# Patient Record
Sex: Female | Born: 1945 | Race: White | Hispanic: No | Marital: Married | Smoking: Never smoker
Health system: Southern US, Community
[De-identification: ages and names within clinical notes are randomized; demographics above are authoritative.]

## PROBLEM LIST (undated history)

## (undated) DIAGNOSIS — I10 Essential (primary) hypertension: Secondary | ICD-10-CM

## (undated) DIAGNOSIS — Z9981 Dependence on supplemental oxygen: Secondary | ICD-10-CM

## (undated) DIAGNOSIS — K219 Gastro-esophageal reflux disease without esophagitis: Secondary | ICD-10-CM

## (undated) DIAGNOSIS — F419 Anxiety disorder, unspecified: Secondary | ICD-10-CM

## (undated) HISTORY — DX: Gastro-esophageal reflux disease without esophagitis: K21.9

## (undated) HISTORY — DX: Essential (primary) hypertension: I10

## (undated) HISTORY — PX: DILATION AND CURETTAGE OF UTERUS: SHX78

---

## 1994-08-05 HISTORY — PX: CERVICAL BIOPSY: SHX590

## 1997-12-26 ENCOUNTER — Ambulatory Visit (HOSPITAL_COMMUNITY): Admission: RE | Admit: 1997-12-26 | Discharge: 1997-12-26 | Payer: Self-pay | Admitting: Gynecology

## 1998-08-24 ENCOUNTER — Other Ambulatory Visit: Admission: RE | Admit: 1998-08-24 | Discharge: 1998-08-24 | Payer: Self-pay | Admitting: Gynecology

## 1999-08-27 ENCOUNTER — Other Ambulatory Visit: Admission: RE | Admit: 1999-08-27 | Discharge: 1999-08-27 | Payer: Self-pay | Admitting: Gynecology

## 1999-12-18 ENCOUNTER — Other Ambulatory Visit: Admission: RE | Admit: 1999-12-18 | Discharge: 1999-12-18 | Payer: Self-pay | Admitting: Gynecology

## 2000-01-29 ENCOUNTER — Encounter: Payer: Self-pay | Admitting: Gynecology

## 2000-01-29 ENCOUNTER — Ambulatory Visit (HOSPITAL_COMMUNITY): Admission: RE | Admit: 2000-01-29 | Discharge: 2000-01-29 | Payer: Self-pay | Admitting: Gynecology

## 2000-09-23 ENCOUNTER — Other Ambulatory Visit: Admission: RE | Admit: 2000-09-23 | Discharge: 2000-09-23 | Payer: Self-pay | Admitting: Gynecology

## 2001-04-29 ENCOUNTER — Ambulatory Visit (HOSPITAL_COMMUNITY): Admission: RE | Admit: 2001-04-29 | Discharge: 2001-04-29 | Payer: Self-pay | Admitting: *Deleted

## 2001-12-24 ENCOUNTER — Other Ambulatory Visit: Admission: RE | Admit: 2001-12-24 | Discharge: 2001-12-24 | Payer: Self-pay | Admitting: Gynecology

## 2001-12-25 ENCOUNTER — Encounter: Admission: RE | Admit: 2001-12-25 | Discharge: 2001-12-25 | Payer: Self-pay | Admitting: Internal Medicine

## 2002-07-16 ENCOUNTER — Encounter: Admission: RE | Admit: 2002-07-16 | Discharge: 2002-07-16 | Payer: Self-pay | Admitting: Internal Medicine

## 2002-08-01 ENCOUNTER — Inpatient Hospital Stay (HOSPITAL_COMMUNITY): Admission: EM | Admit: 2002-08-01 | Discharge: 2002-08-04 | Payer: Self-pay | Admitting: Emergency Medicine

## 2002-08-02 ENCOUNTER — Encounter: Payer: Self-pay | Admitting: Critical Care Medicine

## 2002-08-13 ENCOUNTER — Ambulatory Visit (HOSPITAL_COMMUNITY): Admission: RE | Admit: 2002-08-13 | Discharge: 2002-08-13 | Payer: Self-pay | Admitting: Internal Medicine

## 2002-08-13 ENCOUNTER — Encounter (INDEPENDENT_AMBULATORY_CARE_PROVIDER_SITE_OTHER): Payer: Self-pay | Admitting: Specialist

## 2002-10-26 ENCOUNTER — Other Ambulatory Visit: Admission: RE | Admit: 2002-10-26 | Discharge: 2002-10-26 | Payer: Self-pay | Admitting: Gynecology

## 2003-04-27 ENCOUNTER — Ambulatory Visit (HOSPITAL_COMMUNITY): Admission: RE | Admit: 2003-04-27 | Discharge: 2003-04-27 | Payer: Self-pay | Admitting: *Deleted

## 2003-12-05 ENCOUNTER — Other Ambulatory Visit: Admission: RE | Admit: 2003-12-05 | Discharge: 2003-12-05 | Payer: Self-pay | Admitting: Gynecology

## 2004-07-03 ENCOUNTER — Ambulatory Visit: Payer: Self-pay | Admitting: Internal Medicine

## 2004-08-14 ENCOUNTER — Ambulatory Visit: Payer: Self-pay | Admitting: Internal Medicine

## 2004-10-26 ENCOUNTER — Ambulatory Visit: Payer: Self-pay | Admitting: Internal Medicine

## 2004-12-11 ENCOUNTER — Other Ambulatory Visit: Admission: RE | Admit: 2004-12-11 | Discharge: 2004-12-11 | Payer: Self-pay | Admitting: Obstetrics and Gynecology

## 2004-12-18 ENCOUNTER — Ambulatory Visit (HOSPITAL_COMMUNITY): Admission: RE | Admit: 2004-12-18 | Discharge: 2004-12-18 | Payer: Self-pay | Admitting: Obstetrics and Gynecology

## 2005-01-14 ENCOUNTER — Ambulatory Visit: Payer: Self-pay | Admitting: Internal Medicine

## 2005-03-01 ENCOUNTER — Ambulatory Visit: Payer: Self-pay | Admitting: Critical Care Medicine

## 2005-03-12 ENCOUNTER — Ambulatory Visit: Payer: Self-pay | Admitting: Internal Medicine

## 2005-03-26 ENCOUNTER — Ambulatory Visit: Payer: Self-pay | Admitting: Internal Medicine

## 2005-04-09 ENCOUNTER — Ambulatory Visit: Payer: Self-pay | Admitting: Internal Medicine

## 2005-08-08 ENCOUNTER — Ambulatory Visit: Payer: Self-pay | Admitting: Internal Medicine

## 2005-11-21 ENCOUNTER — Ambulatory Visit: Payer: Self-pay | Admitting: Internal Medicine

## 2006-02-24 ENCOUNTER — Ambulatory Visit: Payer: Self-pay | Admitting: Internal Medicine

## 2006-02-28 ENCOUNTER — Ambulatory Visit: Admission: RE | Admit: 2006-02-28 | Discharge: 2006-02-28 | Payer: Self-pay | Admitting: Internal Medicine

## 2006-04-10 ENCOUNTER — Ambulatory Visit: Payer: Self-pay | Admitting: Internal Medicine

## 2006-05-22 ENCOUNTER — Ambulatory Visit: Payer: Self-pay | Admitting: Internal Medicine

## 2006-08-14 ENCOUNTER — Ambulatory Visit: Payer: Self-pay | Admitting: Internal Medicine

## 2006-11-05 ENCOUNTER — Ambulatory Visit: Payer: Self-pay | Admitting: Internal Medicine

## 2007-05-04 ENCOUNTER — Ambulatory Visit: Payer: Self-pay | Admitting: Internal Medicine

## 2007-06-09 ENCOUNTER — Ambulatory Visit: Payer: Self-pay | Admitting: Internal Medicine

## 2007-07-23 DIAGNOSIS — J479 Bronchiectasis, uncomplicated: Secondary | ICD-10-CM

## 2007-07-23 DIAGNOSIS — R05 Cough: Secondary | ICD-10-CM

## 2007-07-23 DIAGNOSIS — J45909 Unspecified asthma, uncomplicated: Secondary | ICD-10-CM | POA: Insufficient documentation

## 2007-07-23 DIAGNOSIS — R059 Cough, unspecified: Secondary | ICD-10-CM | POA: Insufficient documentation

## 2007-08-18 ENCOUNTER — Ambulatory Visit: Payer: Self-pay | Admitting: Internal Medicine

## 2007-08-28 ENCOUNTER — Ambulatory Visit: Payer: Self-pay | Admitting: Internal Medicine

## 2007-11-18 ENCOUNTER — Ambulatory Visit: Payer: Self-pay | Admitting: Internal Medicine

## 2008-02-08 ENCOUNTER — Ambulatory Visit: Payer: Self-pay | Admitting: Internal Medicine

## 2008-02-08 DIAGNOSIS — J310 Chronic rhinitis: Secondary | ICD-10-CM | POA: Insufficient documentation

## 2008-04-20 ENCOUNTER — Encounter: Payer: Self-pay | Admitting: Internal Medicine

## 2008-05-13 ENCOUNTER — Encounter: Payer: Self-pay | Admitting: Internal Medicine

## 2008-05-30 ENCOUNTER — Ambulatory Visit: Payer: Self-pay | Admitting: Internal Medicine

## 2008-06-17 ENCOUNTER — Telehealth (INDEPENDENT_AMBULATORY_CARE_PROVIDER_SITE_OTHER): Payer: Self-pay | Admitting: *Deleted

## 2008-10-04 ENCOUNTER — Ambulatory Visit: Payer: Self-pay | Admitting: Internal Medicine

## 2009-01-03 ENCOUNTER — Ambulatory Visit: Payer: Self-pay | Admitting: Internal Medicine

## 2009-05-05 ENCOUNTER — Ambulatory Visit: Payer: Self-pay | Admitting: Internal Medicine

## 2009-05-08 DIAGNOSIS — K219 Gastro-esophageal reflux disease without esophagitis: Secondary | ICD-10-CM

## 2009-07-03 ENCOUNTER — Ambulatory Visit: Payer: Self-pay | Admitting: Internal Medicine

## 2009-08-24 ENCOUNTER — Telehealth: Payer: Self-pay | Admitting: Internal Medicine

## 2009-09-05 ENCOUNTER — Ambulatory Visit: Payer: Self-pay | Admitting: Internal Medicine

## 2009-09-05 LAB — CONVERTED CEMR LAB
CO2: 33 meq/L — ABNORMAL HIGH (ref 19–32)
Calcium: 9.8 mg/dL (ref 8.4–10.5)
Chloride: 105 meq/L (ref 96–112)
Creatinine, Ser: 0.8 mg/dL (ref 0.4–1.2)

## 2009-09-08 ENCOUNTER — Ambulatory Visit: Payer: Self-pay | Admitting: Cardiovascular Disease

## 2009-10-17 ENCOUNTER — Ambulatory Visit: Payer: Self-pay | Admitting: Internal Medicine

## 2010-01-30 ENCOUNTER — Ambulatory Visit: Payer: Self-pay | Admitting: Internal Medicine

## 2010-01-31 ENCOUNTER — Encounter: Admission: RE | Admit: 2010-01-31 | Discharge: 2010-01-31 | Payer: Self-pay | Admitting: Gastroenterology

## 2010-03-28 ENCOUNTER — Telehealth (INDEPENDENT_AMBULATORY_CARE_PROVIDER_SITE_OTHER): Payer: Self-pay | Admitting: *Deleted

## 2010-06-18 ENCOUNTER — Telehealth: Payer: Self-pay | Admitting: Internal Medicine

## 2010-07-17 ENCOUNTER — Telehealth (INDEPENDENT_AMBULATORY_CARE_PROVIDER_SITE_OTHER): Payer: Self-pay | Admitting: *Deleted

## 2010-07-18 ENCOUNTER — Telehealth (INDEPENDENT_AMBULATORY_CARE_PROVIDER_SITE_OTHER): Payer: Self-pay | Admitting: *Deleted

## 2010-08-27 ENCOUNTER — Encounter: Payer: Self-pay | Admitting: Internal Medicine

## 2010-08-27 ENCOUNTER — Ambulatory Visit
Admission: RE | Admit: 2010-08-27 | Discharge: 2010-08-27 | Payer: Self-pay | Source: Home / Self Care | Attending: Internal Medicine | Admitting: Internal Medicine

## 2010-08-27 ENCOUNTER — Other Ambulatory Visit: Payer: Self-pay | Admitting: Internal Medicine

## 2010-08-27 DIAGNOSIS — R079 Chest pain, unspecified: Secondary | ICD-10-CM | POA: Insufficient documentation

## 2010-08-27 LAB — BASIC METABOLIC PANEL
BUN: 15 mg/dL (ref 6–23)
CO2: 31 mEq/L (ref 19–32)
Calcium: 9.7 mg/dL (ref 8.4–10.5)
Chloride: 101 mEq/L (ref 96–112)
Creatinine, Ser: 0.7 mg/dL (ref 0.4–1.2)
Glucose, Bld: 91 mg/dL (ref 70–99)
Potassium: 3.7 mEq/L (ref 3.5–5.1)

## 2010-08-27 LAB — CBC WITH DIFFERENTIAL/PLATELET
Hemoglobin: 15.7 g/dL — ABNORMAL HIGH (ref 12.0–15.0)
Lymphocytes Relative: 14.9 % (ref 12.0–46.0)
MCHC: 34.5 g/dL (ref 30.0–36.0)
MCV: 88.2 fl (ref 78.0–100.0)
Monocytes Relative: 7.8 % (ref 3.0–12.0)
Neutro Abs: 7.4 10*3/uL (ref 1.4–7.7)
Neutrophils Relative %: 75.3 % (ref 43.0–77.0)
WBC: 9.9 10*3/uL (ref 4.5–10.5)

## 2010-09-01 ENCOUNTER — Emergency Department (HOSPITAL_COMMUNITY)
Admission: EM | Admit: 2010-09-01 | Discharge: 2010-09-02 | Payer: Self-pay | Source: Home / Self Care | Admitting: Emergency Medicine

## 2010-09-01 LAB — POCT CARDIAC MARKERS: CKMB, poc: <1 ng/mL — ABNORMAL LOW (ref 1.0–8.0)

## 2010-09-02 LAB — COMPREHENSIVE METABOLIC PANEL
Albumin: 3.9 g/dL (ref 3.5–5.2)
Alkaline Phosphatase: 83 U/L (ref 39–117)
BUN: 13 mg/dL (ref 6–23)
Calcium: 9.5 mg/dL (ref 8.4–10.5)
Chloride: 104 mEq/L (ref 96–112)
GFR calc non Af Amer: >60 mL/min (ref >60)
Potassium: 3.4 mEq/L — ABNORMAL LOW (ref 3.5–5.1)
Total Bilirubin: 0.7 mg/dL (ref 0.3–1.2)

## 2010-09-02 LAB — LIPASE, BLOOD: Lipase: 24 U/L (ref 11–59)

## 2010-09-02 LAB — CBC
HCT: 43.7 % (ref 36.0–46.0)
MCH: 29.6 pg (ref 26.0–34.0)
MCV: 86.2 fL (ref 78.0–100.0)
RBC: 5.07 MIL/uL (ref 3.87–5.11)
WBC: 10.4 10*3/uL (ref 4.0–10.5)

## 2010-09-02 LAB — DIFFERENTIAL
Basophils Relative: 0 % (ref 0–1)
Eosinophils Absolute: 0.2 10*3/uL (ref 0.0–0.7)
Eosinophils Relative: 2 % (ref 0–5)
Neutro Abs: 6.7 10*3/uL (ref 1.7–7.7)

## 2010-09-04 NOTE — Progress Notes (Signed)
Summary: samples symbicort  Phone Note Call from Patient Call back at Home Phone [phone]   Caller: Patient Call For: wert Summary of Call: pt requests a sample of symbicort- she is in donut hole. ok to leave detailed msg on phone as she is taking her spouse to a meeting now.  Initial call taken by: Tivis Ringer, CNA,  June 18, 2010 9:15 AM  Follow-up for Phone Call        pt advised samples at front.Carron Curie CMA  June 18, 2010 10:29 AM

## 2010-09-04 NOTE — Assessment & Plan Note (Signed)
Summary: NP follow up - med calendar   Primary Provider/Referring Provider:  [facility]  CC:  est med calendar - pt brought all meds with her today.  no complaints.  History of Present Illness: 65  yowf  never smoker with documented bronchiectasis with chronic cough with all of her symptoms completely resolved on Symbicort 80/4.5 two puffs b.i.d. despite evidence of significant airflow obstruction by PFTs.(prev failed qvar).  October 04, 2008 ov no change in doe , no change in cough.   January 03, 2009 --Presents for follow up and med calendar. Since last visit has been doing fairly well, has daily congestion clear to light yellow. Most days minimal production. NO flares w/ antibiotics since last visit. Brought all her meds today,  several changes- she is off several of her as needed meds.  Does have stuffy nose w/ clear mucous over last 2 weeks, mild sinus pressure.   May 05, 2009--Returns of 4 month follow up - c/o head congestion, PND onset this morning, and increase in reflux x2 months - Drainage in throat causing soreness, cough sl increased clear mucus mainly. No otc used.  rec add pepcid at bedtime and zyrtec as needed   July 03, 2009 ov:Pt c/o productive cough with green phlegm x 3 days.  Previously doing well but not understanding yet how to use calendar appropriately for maint vs prns.   mucus tuned almost clear after 5 days of levaquin.  September 05, 2009 6 wk followup.  Pt states that her cough has improved since last seen.  She c/o "feeling breathless" x 2 months, mucus did not clear on zpak,  no really using her prns appropriately.   October 17, 2009--Presents for follow up and med review. Last visit w/ bronchitic flare, tx w/ steroid taper  and Levaquin. Cough and congestion resolved. CT sinus w/ mild chronic dz, CT chest w/ mild diffuse bronchiectasis. She is doing well. Retired in [place] 2011. Walking daily. We reviewed her meds and updated her med calendar.  Denies chest pain, dyspnea, orthopnea, hemoptysis, fever, n/v/d, edema, headache,recent travel .       Marland Kitchen    Medications Prior to Update: 1)  Micardis Hct 80-12.5 Mg  Tabs (Telmisartan-Hctz) .... Take 1 Tablet By Mouth Once A Day 2)  Fluticasone Propionate 50 Mcg/act Susp (Fluticasone Propionate) .... 0-2 Puffs in The Morning, and 1-2 Puffs At Beditme 3)  Symbicort 160-4.5 Mcg/act Aero (Budesonide-Formoterol Fumarate) .... Inhale 2 Puffs Two Times A Day 4)  Multivitamins   Tabs (Multiple Vitamin) .... Once Daily 5)  Adult Aspirin Low Strength 81 Mg  Tbdp (Aspirin) .... Take 1 Tablet By Mouth Once A Day 6)  Caltrate 600 1500 Mg  Tabs (Calcium Carbonate) .... Take 1 Tablet By Mouth Once A Day 7)  Pepcid 20 Mg Tabs (Famotidine) .Marland Kitchen.. 1 At Bedtime 8)  Mucinex Dm 30-600 Mg  Tb12 (Dextromethorphan-Guaifenesin) .Marland Kitchen.. 1-2 Every 12 Hours As Needed For Thick Mucus, Congestion and Cough 9)  Flutter Valve .... Use Several Times A Day As Needed For Thick Mucus, Congestion, and Cough 10)  Xopenex Hfa 45 Mcg/act  Aero (Levalbuterol Tartrate) .... 2 Puffs Every 4 Hrs As Needed For Wheezing/shortness of Breath 11)  Afrin Nasal Spray 0.05 % Soln (Oxymetazoline Hcl) .... 2 Puffs Twice Daily For 5 Days As Needed For Nasal Congestion 12)  Nasal Moisturizer 0.65 % Soln (Saline) .... Use Several Times A Day As Needed For Nasal Congestion 13)  Zyrtec  Allergy 10 Mg Tabs (Cetirizine Hcl) .... Take 1 Tab By Mouth At Bedtime As Needed 14)  Vicodin 5-500 Mg Tabs (Hydrocodone-Acetaminophen) .... One Every 4 Hours As Needed 15)  Benzonatate 100 Mg Caps (Benzonatate) .Marland Kitchen.. 1-2 Tablets Three Times A Day As Needed Cough 16)  Levaquin 750 Mg  Tabs (Levofloxacin) .... One Tablet By Mouth Daily X 5 Days 17)  Prednisone 10 Mg  Tabs (Prednisone) .... 4 Each Am X 2days, 2x2days, 1x2days and Stop  Allergies (verified): 1)  ! Xanax 2)  ! Pcn 3)  ! Sulfa 4)  ! * Avelox 5)  ! Reglan 6)  * Tramadol  Past History:  Family  History: Last updated: 11/18/2007 emphysema in her mother who was a smoker  Lung cancer in her father  Social History: Last updated: 05/05/2009 she never smoked no alcohol no caffeine married 2 daughters Gaffer  Risk Factors: Smoking Status: never (08/18/2007)  Past Medical History: Bronchiectasis     - CT chest and sinus ordered September 05, 2009      - HFA poor September 05, 2009 > 50% with coaching Hypertension GERD..............................................................................Marland KitchenEdwards   - Pos EGD 05/13/08 Complex med regimen ---Meds reviewed with pt education and computerized med calendar completed/adjusted October 17, 2009   Review of Systems      See HPI  Vital Signs:  Patient profile:   65 year old female Height:      62 inches Weight:      126.25 pounds BMI:     23.17 O2 Sat:      96 % on Room air Temp:     97.3 degrees F oral Pulse rate:   90 / minute BP sitting:   104 / 64  (left arm) Cuff size:   regular  Vitals Entered By: Boone Master CNA (October 17, 2009 9:05 AM)  O2 Flow:  Room air CC: est med calendar - pt brought all meds with her today.  no complaints Is Patient Diabetic? No Comments Medications reviewed with patient Daytime contact number verified with patient. Boone Master CNA  October 17, 2009 9:05 AM    Physical Exam  Additional Exam:  Amb somber,  tearful wf nad   wt 1 132  October 04, 2008 >>133 January 03, 2009 >>135 May 08, 2009 > 129 July 03 2009 > 123 September 05, 2009 >126 October 17, 2009  HEENT mild turbinate edema, non-tender sinus, clear discharge.  Oropharynx no thrush or excess pnd or cobblestoning.  No JVD or cervical adenopathy. Mild accessory muscle hypertrophy. Trachea midline, nl thryroid. Chest was hyperinflated by percussion with diminished breath sounds in bases ,no wheezing.  Regular rate and rhythm without murmur gallop or rub or increase P2.  No edema Abd: no hsm, nl excursion. Ext warm  without cyanosis or clubbing.     Impression & Recommendations:  Problem # 1:  BRONCHIECTASIS W/O ACUTE EXACERBATION (ICD-494.0) Recent exacerbation now resolved. CT w/ mild difffuse changes.  Meds reviewed with pt education and computerized med calendar completed/adjusted.  cont on same regimen  follow up Dr. Sherene Sires in 3 months   Complete Medication List: 1)  Micardis Hct 80-12.5 Mg Tabs (Telmisartan-hctz) .... Take 1 tablet by mouth once a day 2)  Fluticasone Propionate 50 Mcg/act Susp (Fluticasone propionate) .... 0-2 puffs in the morning, and 1-2 puffs at beditme 3)  Symbicort 160-4.5 Mcg/act Aero (Budesonide-formoterol fumarate) .... Inhale 2 puffs two times a day 4)  Multivitamins Tabs (  Multiple vitamin) .... Once daily 5)  Adult Aspirin Low Strength 81 Mg Tbdp (Aspirin) .... Take 1 tablet by mouth once a day 6)  Caltrate 600 1500 Mg Tabs (Calcium carbonate) .... Take 1 tablet by mouth once a day 7)  Pepcid 20 Mg Tabs (Famotidine) .Marland Kitchen.. 1 at bedtime 8)  Mucinex Dm 30-600 Mg Tb12 (Dextromethorphan-guaifenesin) .Marland Kitchen.. 1-2 every 12 hours as needed for thick mucus, congestion and cough 9)  Flutter Valve  .... Use several times a day as needed for thick mucus, congestion, and cough 10)  Xopenex Hfa 45 Mcg/act Aero (Levalbuterol tartrate) .... 2 puffs every 4 hrs as needed for wheezing/shortness of breath 11)  Afrin Nasal Spray 0.05 % Soln (Oxymetazoline hcl) .... 2 puffs twice daily for 5 days as needed for nasal congestion 12)  Nasal Moisturizer 0.65 % Soln (Saline) .... Use several times a day as needed for nasal congestion 13)  Zyrtec Allergy 10 Mg Tabs (Cetirizine hcl) .... Take 1 tab by mouth at bedtime as needed 14)  Vicodin 5-500 Mg Tabs (Hydrocodone-acetaminophen) .... One every 4 hours as needed 15)  Benzonatate 100 Mg Caps (Benzonatate) .Marland Kitchen.. 1-2 tablets three times a day as needed cough 16)  Levaquin 750 Mg Tabs (Levofloxacin) .... One tablet by mouth daily x 5 days 17)  Prednisone  10 Mg Tabs (Prednisone) .... 4 each am x 2days, 2x2days, 1x2days and stop  Other Orders: Est. Patient Level III (70350)  Patient Instructions: 1)  Continue on current regimen 2)  Follow med calendar closely and bring med calendar to each visit.  3)  follow up Dr. Sherene Sires in 3-4 months and as needed  Prescriptions: SYMBICORT 160-4.5 MCG/ACT AERO (BUDESONIDE-FORMOTEROL FUMARATE) Inhale 2 puffs two times a day  #1 x 5   Entered and Authorized by:   Rubye Oaks NP   Signed by:   Kyrese Gartman NP on 10/17/2009   Method used:   Electronically to        [facility] (retail)       [address]       [place], [place]  [address]       Ph: [phone]       Fax: [phone]   RxID:   [id number]     Appended Document: med calendar update Medications Added PANTOPRAZOLE SODIUM 40 MG TBEC (PANTOPRAZOLE SODIUM) Take 1 tablet by mouth once a day before meal FAMOTIDINE 40 MG TABS (FAMOTIDINE) Take 1 tab by mouth at bedtime BENZONATATE 100 MG CAPS (BENZONATATE) 1 capsule by mouth every 8 hours as needed for cough HYDROCODONE-ACETAMINOPHEN 5-500 MG TABS (HYDROCODONE-ACETAMINOPHEN) 1 tabs every 4 hours as needed for pain or cough          Clinical Lists Changes  Medications: Added new medication of PANTOPRAZOLE SODIUM 40 MG TBEC (PANTOPRAZOLE SODIUM) Take 1 tablet by mouth once a day before meal Changed medication from PEPCID 20 MG TABS (FAMOTIDINE) 1 at bedtime to FAMOTIDINE 40 MG TABS (FAMOTIDINE) Take 1 tab by mouth at bedtime Changed medication from BENZONATATE 100 MG CAPS (BENZONATATE) 1-2 tablets three times a day as needed cough to BENZONATATE 100 MG CAPS (BENZONATATE) 1 capsule by mouth every 8 hours as needed for cough Changed medication from VICODIN 5-500 MG TABS (HYDROCODONE-ACETAMINOPHEN) one every 4 hours as needed to HYDROCODONE-ACETAMINOPHEN 5-500 MG TABS (HYDROCODONE-ACETAMINOPHEN) 1 tabs every 4 hours as needed for  pain or cough Removed medication of PREDNISONE 10 MG  TABS (  PREDNISONE) 4 each am x 2days, 2x2days, 1x2days and stop      Appended Document: NP follow up - med calendar faxed to Dr. Duane Lope at [facility] via [employer] per TP's request.

## 2010-09-04 NOTE — Progress Notes (Signed)
Summary: sample  Phone Note Call from Patient Call back at Home Phone [phone]   Caller: Patient Call For: wert Reason for Call: Talk to Nurse Summary of Call: pt in rx gap and Symbicort $105.00 - can she get a sample? Initial call taken by: Eugene Gavia,  March 28, 2010 2:39 PM  Follow-up for Phone Call        2 boxes of symbicort left up front for pick up.  Spoke with pt and made aware. Follow-up by: Vernie Murders,  March 28, 2010 2:46 PM

## 2010-09-04 NOTE — Assessment & Plan Note (Signed)
Summary: Pulmonary/ extended ov with Dorothy alalogy discussed   Primary Provider/Referring Provider:  [facility]  CC:  6 wk followup.  Pt states that her cough has improved since last seen.  She c/o "feeling breathless" x 2 months.  No new complaints today.Marland Kitchen  History of Present Illness: 89 yowf  never smoker with documented bronchiectasis with chronic cough with all of her symptoms completely resolved on Symbicort 80/4.5 two puffs b.i.d. despite evidence of significant airflow obstruction by PFTs.(prev failed qvar).  October 04, 2008 ov no change in doe , no change in cough.   January 03, 2009 --Presents for follow up and med calendar. Since last visit has been doing fairly well, has daily congestion clear to light yellow. Most days minimal production. NO flares w/ antibiotics since last visit. Brought all her meds today,  several changes- she is off several of her as needed meds.  Does have stuffy nose w/ clear mucous over last 2 weeks, mild sinus pressure.   May 05, 2009--Returns of 4 month follow up - c/o head congestion, PND onset this morning, and increase in reflux x2 months - Drainage in throat causing soreness, cough sl increased clear mucus mainly. No otc used.  rec add pepcid at bedtime and zyrtec as needed   July 03, 2009 ov:Pt c/o productive cough with green phlegm x 3 days.  Previously doing well but not understanding yet how to use calendar appropriately for maint vs prns.   mucus tuned almost clear after 5 days of levaquin.  September 05, 2009 6 wk followup.  Pt states that her cough has improved since last seen.  She c/o "feeling breathless" x 2 months, mucus did not clear on zpak,  no really using her prns appropriately. Pt denies any significant sore throat, dysphagia, itching, sneezing,  nasal congestion or excess nasal secretions,  fever, chills, sweats, unintended wt loss, pleuritic or exertional cp, hempoptysis, change in activity tolerance  orthopnea pnd  or leg swelling. Pt also denies any obvious fluctuation in symptoms with weather or environmental change or other alleviating or aggravating factors.         .    Current Medications (verified): 1)  Micardis Hct 80-12.5 Mg  Tabs (Telmisartan-Hctz) .... Take 1 Tablet By Mouth Once A Day 2)  Fluticasone Propionate 50 Mcg/act Susp (Fluticasone Propionate) .... 0-2 Puffs in The Morning, and 1-2 Puffs At Beditme 3)  Symbicort 160-4.5 Mcg/act Aero (Budesonide-Formoterol Fumarate) .... Inhale 2 Puffs Two Times A Day 4)  Multivitamins   Tabs (Multiple Vitamin) .... Once Daily 5)  Adult Aspirin Low Strength 81 Mg  Tbdp (Aspirin) .... Take 1 Tablet By Mouth Once A Day 6)  Caltrate 600 1500 Mg  Tabs (Calcium Carbonate) .... Take 1 Tablet By Mouth Once A Day 7)  Pepcid 20 Mg Tabs (Famotidine) .Marland Kitchen.. 1 At Bedtime 8)  Mucinex Dm 30-600 Mg  Tb12 (Dextromethorphan-Guaifenesin) .Marland Kitchen.. 1-2 Every 12 Hours As Needed For Thick Mucus, Congestion and Cough 9)  Flutter Valve .... Use Several Times A Day As Needed For Thick Mucus, Congestion, and Cough 10)  Xopenex Hfa 45 Mcg/act  Aero (Levalbuterol Tartrate) .... 2 Puffs Every 4 Hrs As Needed For Wheezing/shortness of Breath 11)  Afrin Nasal Spray 0.05 % Soln (Oxymetazoline Hcl) .... 2 Puffs Twice Daily For 5 Days As Needed For Nasal Congestion 12)  Nasal Moisturizer 0.65 % Soln (Saline) .... Use Several Times A Day As Needed For Nasal Congestion 13)  Zyrtec Allergy 10 Mg Tabs (Cetirizine Hcl) .... Take 1 Tab By Mouth At Bedtime As Needed 14)  Vicodin 5-500 Mg Tabs (Hydrocodone-Acetaminophen) .... One Every 4 Hours As Needed 15)  Benzonatate 100 Mg Caps (Benzonatate) .Marland Kitchen.. 1-2 Tablets Three Times A Day As Needed Cough  Allergies (verified): 1)  ! Xanax 2)  ! Pcn 3)  ! Sulfa 4)  ! * Avelox 5)  ! Reglan  Past History:  Past Medical History: Bronchiectasis     - CT chest and sinus ordered September 05, 2009      - HFA poor September 05, 2009 > 50% with  coaching Hypertension GERD..............................................................................Marland KitchenEdwards   - Pos EGD 05/13/08  Vital Signs:  Patient profile:   65 year old female Weight:      123 pounds O2 Sat:      93 % on Room air Temp:     97.9 degrees F oral Pulse rate:   84 / minute BP sitting:   112 / 80  (left arm)  Vitals Entered By: Vernie Murders (September 05, 2009 10:24 AM)  O2 Flow:  Room air  Physical Exam  Additional Exam:  Amb somber,  tearful wf nad   wt 1 132  October 04, 2008 >>133 January 03, 2009 >>135 May 08, 2009 > 129 July 03 2009 > 123 September 05, 2009  HEENT mild turbinate edema, non-tender sinus, clear discharge.  Oropharynx no thrush or excess pnd or cobblestoning.  No JVD or cervical adenopathy. Mild accessory muscle hypertrophy. Trachea midline, nl thryroid. Chest was hyperinflated by percussion with diminished breath sounds and moderate increased exp time without wheeze. Regular rate and rhythm without murmur gallop or rub or increase P2.  No edema Abd: no hsm, nl excursion. Ext warm without cyanosis or clubbing.     Impression & Recommendations:  Problem # 1:  BRONCHIECTASIS WITH ACUTE EXACERBATION (ICD-494.1) Poor chronic control and use of medication calendar as intended.  I had an extended discussion with the patient today lasting 15 to 20 minutes of a 25 minute visit on the following issues:    I emphasized to the patient that just like Dorothy in [employer], she is already wearing "ruby slippers" she can click anytime she wants:  the answer to all her recurrent symptoms  is already  literally at her fingertips:   all she has to do is refer to the medicine calendar we provided her  and her problems  are each addressed in a user-friendly format, reading from left to right for each symptoms she's likely to develop between office visits.   I spent extra time with the patient today explaining optimal mdi  technique.  This improved  from  25-50%   Medications Added to Medication List This Visit: 1)  Pepcid 20 Mg Tabs (Famotidine) .Marland Kitchen.. 1 at bedtime 2)  Levaquin 750 Mg Tabs (Levofloxacin) .... One tablet by mouth daily x 5 days 3)  Prednisone 10 Mg Tabs (Prednisone) .... 4 each am x 2days, 2x2days, 1x2days and stop  Other Orders: Misc. Referral (Misc. Ref) TLB-BMP (Basic Metabolic Panel-BMET) (80048-METABOL) Est. Patient Level IV ([address]) HFA Instruction [phone])  Patient Instructions: 1)  Prednisone 4 each am x 2days, 2x2days, 1x2days and stop  2)  Levaquin x 5 day cycles for nasty mucus 3)  GERD (REFLUX)  is a common cause of respiratory symptoms. It commonly presents without heartburn and can be treated with medication, but also with  lifestyle changes including avoidance of late meals, excessive alcohol, smoking cessation, and avoid fatty foods, chocolate, peppermint, colas, red wine, and acidic juices such as orange juice. NO MINT OR MENTHOL PRODUCTS SO NO COUGH DROPS  4)  USE SUGARLESS CANDY INSTEAD (jolley ranchers)  5)  NO OIL BASED VITAMINS 6)  See Tammy NP w/in 6 weeks with all your medications, even over the counter meds, separated in two separate bags, the ones you take no matter what vs the ones you stop once you feel better and take only as needed.  She will generate for you a new user friendly medication calendar that will put [place] all on the same page re: your medication use.  She will set you up for ov with pfts with me Prescriptions: XOPENEX HFA 45 MCG/ACT  AERO (LEVALBUTEROL TARTRATE) 2 puffs every 4 hrs as needed for wheezing/shortness of breath  #1 x 11   Entered and Authorized by:   Nyoka Cowden MD   Signed by:   Nyoka Cowden MD on 09/05/2009   Method used:   Electronically to        [facility] (retail)       [address]       [place], [place]  [address]       Ph: [phone]       Fax: [phone]   RxID:   [id number] PREDNISONE 10 MG  TABS  (PREDNISONE) 4 each am x 2days, 2x2days, 1x2days and stop  #14 x 0   Entered and Authorized by:   Nyoka Cowden MD   Signed by:   Nyoka Cowden MD on 09/05/2009   Method used:   Electronically to        [facility] (retail)       [address]       [place], [place]  [address]       Ph: [phone]       Fax: [phone]   RxID:   [id number] LEVAQUIN 750 MG  TABS (LEVOFLOXACIN) One tablet by mouth daily x 5 days  #5 x 11   Entered and Authorized by:   Nyoka Cowden MD   Signed by:   Nyoka Cowden MD on 09/05/2009   Method used:   Electronically to        [facility] (retail)       [address]       [place], [place]  [address]       Ph: [phone]       Fax: [phone]   RxID:   [id number]

## 2010-09-04 NOTE — Progress Notes (Signed)
Summary: rx req/ cough/ congestion  Phone Note Call from Patient Call back at Work Phone [phone]   Caller: Patient Call For: wert Summary of Call: pt has appt pend for tues 1/25. pt is requesting an abx/ perhaps rx for her cough. has been coughing "off and on" since thanksgiivng. last 2-3 days the phlegm has become a draker yellow in color. denies fever. [facility] [address] ph# [phone] until 4pm. Initial call taken by: Tivis Ringer, CNA,  August 24, 2009 1:00 PM  Follow-up for Phone Call        MW patient--Pt c/o prodictive cough with dark yellow phlegm. Pt denies fever. Pt request abx and cough med. Please advise. Carron Curie CMA  August 24, 2009 2:33 PM 1)  ! Xanax 2)  ! Pcn 3)  ! Sulfa 4)  ! * Avelox 5)  ! Reglan  Additional Follow-up for Phone Call Additional follow up Details #1::        Per CY, zpak as directed and benzonatate 100mg  #30 x 1 1-2 three times a day as needed cough Gweneth Dimitri RN  August 24, 2009 3:32 PM  rx sent. pt aware. Carron Curie CMA  August 24, 2009 3:57 PM       New/Updated Medications: ZITHROMAX Z-PAK 250 MG TABS (AZITHROMYCIN) as directed BENZONATATE 100 MG CAPS (BENZONATATE) 1-2 tablets three times a day as needed cough Prescriptions: BENZONATATE 100 MG CAPS (BENZONATATE) 1-2 tablets three times a day as needed cough  #30 x 1   Entered by:   Carron Curie CMA   Authorized by:   Waymon Budge MD   Signed by:   Carron Curie CMA on 08/24/2009   Method used:   Electronically to        [facility] (retail)       [address]       [place], [place]  [address]       Ph: [phone]       Fax: [phone]   RxID:   [id number] ZITHROMAX Z-PAK 250 MG TABS (AZITHROMYCIN) as directed  #1 pak x 0   Entered by:   Carron Curie CMA   Authorized by:   Waymon Budge MD   Signed by:   Carron Curie CMA on 08/24/2009   Method used:   Electronically to   [facility] (retail)       [address]       [place], [place]  [address]       Ph: [phone]       Fax: [phone]   RxID:   [id number]

## 2010-09-04 NOTE — Assessment & Plan Note (Signed)
Summary: Pulmonary/ f/u ov   Primary Provider/Referring Provider:  [facility]  CC:  3 month followup.  Pt states that she has not been doing much coughing.  She c/o increased acid reflux but has been seeing Dr Randa Evens for this issue.Marland Kitchen  History of Present Illness: 30  yowf  never smoker with documented bronchiectasis with chronic cough with all of her symptoms completely resolved on Symbicort 80/4.5 two puffs b.i.d. despite evidence of significant airflow obstruction by PFTs.(prev failed qvar).  October 04, 2008 ov no change in doe , no change in cough.   January 03, 2009 --Presents for follow up and med calendar. Since last visit has been doing fairly well, has daily congestion clear to light yellow. Most days minimal production. NO flares w/ antibiotics since last visit. Brought all her meds today,  several changes- she is off several of her as needed meds.  Does have stuffy nose w/ clear mucous over last 2 weeks, mild sinus pressure.   May 05, 2009--Returns of 4 month follow up - c/o head congestion, PND onset this morning, and increase in reflux x2 months - Drainage in throat causing soreness, cough sl increased clear mucus mainly. No otc used.  rec add pepcid at bedtime and zyrtec as needed   September 05, 2009 6 wk followup.  Pt states that her cough has improved since last seen.  She c/o "feeling breathless" x 2 months, mucus did not clear on zpak,  no really using her prns appropriately.   January 30, 2010 ov 3 month followup.  Pt states that she has not been doing much coughing.  She c/o increased acid reflux but has been seeing Dr Randa Evens for this issue.  Using med calendar effectively now. Pt denies any significant sore throat, dysphagia, itching, sneezing,  nasal congestion or excess secretions,  fever, chills, sweats, unintended wt loss, pleuritic or exertional cp, hempoptysis, change in activity tolerance  orthopnea pnd or leg swelling.    . .    Current  Medications (verified): 1)  Micardis Hct 80-12.5 Mg  Tabs (Telmisartan-Hctz) .... Take 1 Tablet By Mouth Once A Day 2)  Fluticasone Propionate 50 Mcg/act Susp (Fluticasone Propionate) .... 0-2 Puffs in The Morning, and 1-2 Puffs At Beditme 3)  Symbicort 160-4.5 Mcg/act Aero (Budesonide-Formoterol Fumarate) .... Inhale 2 Puffs Two Times A Day 4)  Nexium 40 Mg Cpdr (Esomeprazole Magnesium) .Marland Kitchen.. 1 Two Times A Day 5)  Multivitamins   Tabs (Multiple Vitamin) .... Once Daily 6)  Adult Aspirin Low Strength 81 Mg  Tbdp (Aspirin) .... Take 1 Tablet By Mouth Once A Day 7)  Caltrate 600 1500 Mg  Tabs (Calcium Carbonate) .... Take 1 Tablet By Mouth Once A Day 8)  Famotidine 40 Mg Tabs (Famotidine) .... Take 1 Tab By Mouth At Bedtime 9)  Mucinex Dm 30-600 Mg  Tb12 (Dextromethorphan-Guaifenesin) .Marland Kitchen.. 1-2 Every 12 Hours As Needed For Thick Mucus, Congestion and Cough 10)  Flutter Valve .... Use Several Times A Day As Needed For Thick Mucus, Congestion, and Cough 11)  Xopenex Hfa 45 Mcg/act  Aero (Levalbuterol Tartrate) .... 2 Puffs Every 4 Hrs As Needed For Wheezing/shortness of Breath 12)  Afrin Nasal Spray 0.05 % Soln (Oxymetazoline Hcl) .... 2 Puffs Twice Daily For 5 Days As Needed For Nasal Congestion 13)  Nasal Moisturizer 0.65 % Soln (Saline) .... Use Several Times A Day As Needed For Nasal Congestion 14)  Zyrtec Allergy 10 Mg Tabs (Cetirizine Hcl) .Marland KitchenMarland KitchenMarland Kitchen  Take 1 Tab By Mouth At Bedtime As Needed 15)  Benzonatate 100 Mg Caps (Benzonatate) .Marland Kitchen.. 1 Capsule By Mouth Every 8 Hours As Needed For Cough 16)  Hydrocodone-Acetaminophen 5-500 Mg Tabs (Hydrocodone-Acetaminophen) .Marland Kitchen.. 1 Tabs Every 4 Hours As Needed For Pain or Cough  Allergies (verified): 1)  ! Xanax 2)  ! Pcn 3)  ! Sulfa 4)  ! * Avelox 5)  ! Reglan 6)  * Tramadol  Past History:  Past Medical History: Bronchiectasis     - CT chest and sinus ordered September 05, 2009 >>     - HFA poor September 05, 2009 > 75 % with coaching January 30, 2010      Hypertension GERD...............................................................................Marland KitchenEdwards   - Pos EGD 05/13/08 Complex med regimen ---Meds reviewed with pt education and computerized med calendar completed/adjusted October 17, 2009   Vital Signs:  Patient profile:   65 year old female Weight:      126 pounds O2 Sat:      93 % on Room air Temp:     98.2 degrees F oral Pulse rate:   78 / minute BP sitting:   118 / 52  (right arm)  Vitals Entered By: Vernie Murders (January 30, 2010 10:32 AM)  O2 Flow:  Room air  Physical Exam  Additional Exam:  Amb wf nad   wt 1 132  October 04, 2008 > 129 July 03 2009 > 123 September 05, 2009 >126 October 17, 2009  > 126 January 30, 2010  HEENT mild turbinate edema, non-tender sinus, clear discharge.  Oropharynx no thrush or excess pnd or cobblestoning.  No JVD or cervical adenopathy. Mild accessory muscle hypertrophy. Trachea midline, nl thryroid. Chest was hyperinflated by percussion with diminished breath sounds in bases ,no wheezing.  Regular rate and rhythm without murmur gallop or rub or increase P2.  No edema Abd: no hsm, nl excursion. Ext warm without cyanosis or clubbing.     Impression & Recommendations:  Problem # 1:  BRONCHIECTASIS W/O ACUTE EXACERBATION (ICD-494.0) doing much better despite complexity of care using present med calendar.   Each maintenance medication was reviewed in detail including most importantly the difference between maintenance and as needed and under what circumstances the prns are to be used. This was done in the context of a medication calendar review which provided the patient with a user-friendly unambiguous mechanism for medication administration and reconciliation and provides an action plan for all active problems. It is critical that this be shown to every doctor  for modification during the office visit if necessary so the patient can use it as a working document.   Medications Added to Medication  List This Visit: 1)  Nexium 40 Mg Cpdr (Esomeprazole magnesium) .Marland Kitchen.. 1 two times a day  Other Orders: Est. Patient Level III (83151) Prescription Created Electronically [phone])  Patient Instructions: 1)  Work on perfecting  inhaler technique:  relax and blow all the way out then take a nice smooth deep breath back in, triggering the inhaler at same time you start breathing in hold a few seconds then rinse and gargle 2)  See calendar for specific medication instructions and bring it back for each and every office visit for every healthcare provider you see.  Without it,  you may not receive the best quality medical care that we feel you deserve.  3)  Return to office in 6 months, sooner if needed  Prescriptions: SYMBICORT 160-4.5 MCG/ACT AERO (BUDESONIDE-FORMOTEROL FUMARATE)  Inhale 2 puffs two times a day  #1 x 11   Entered by:   Vernie Murders   Authorized by:   Nyoka Cowden MD   Signed by:   Vernie Murders on 01/30/2010   Method used:   Electronically to        [facility] (retail)       [address]       [place], [place]  [address]       Ph: [phone]       Fax: [phone]   RxID:   [id number]

## 2010-09-06 NOTE — Progress Notes (Signed)
Summary: samples of nexium  Phone Note Call from Patient Call back at Home Phone [phone]   Caller: Patient Call For: [facility] Summary of Call: pt wants samples of nexium since her GI dr doesn't have any. cell [phone] Initial call taken by: Tivis Ringer, CNA,  July 18, 2010 10:59 AM  Follow-up for Phone Call        we do not have any samples of nexium at this time either.  Pt aware. Follow-up by: Gweneth Dimitri RN,  July 18, 2010 11:59 AM

## 2010-09-06 NOTE — Progress Notes (Signed)
Summary: samples  Phone Note Call from Patient Call back at Home Phone [phone]   Caller: Patient Call For: [facility] Summary of Call: wants samples of symbicort until jan (in [place]).  Initial call taken by: Tivis Ringer, CNA,  July 17, 2010 11:08 AM  Follow-up for Phone Call        Pt informed that samples were left at front desk for pick up. Abigail Miyamoto RN  July 17, 2010 11:24 AM

## 2010-09-06 NOTE — Assessment & Plan Note (Signed)
Summary: Pulmonary/ ext of quivering in chest   Primary Provider/Referring Provider:  [facility]  CC:  "Fluttering in chest".  History of Present Illness: 68  yowf  never smoker with documented bronchiectasis with chronic cough with all of her symptoms completely resolved on Symbicort 80/4.5 two puffs b.i.d. despite evidence of significant airflow obstruction by PFTs.(prev failed qvar).  October 04, 2008 ov no change in doe , no change in cough.   January 03, 2009 --Presents for follow up and med calendar. Since last visit has been doing fairly well, has daily congestion clear to light yellow. Most days minimal production. NO flares w/ antibiotics since last visit. Brought all her meds today,  several changes- she is off several of her as needed meds.  Does have stuffy nose w/ clear mucous over last 2 weeks, mild sinus pressure.   May 05, 2009--Returns of 4 month follow up - c/o head congestion, PND onset this morning, and increase in reflux x2 months - Drainage in throat causing soreness, cough sl increased clear mucus mainly. No otc used.  rec add pepcid at bedtime and zyrtec as needed   September 05, 2009 6 wk followup.  Pt states that her cough has improved since last seen.  She c/o "feeling breathless" x 2 months, mucus did not clear on zpak,  no really using her prns appropriately.   January 30, 2010 ov 3 month followup.  Pt states that she has not been doing much coughing.  She c/o increased acid reflux but has been seeing Dr Randa Evens for this issue. rec no change  August 27, 2010 ov cc cough and sob better, felt quivering in chest 4 am resolved by time of ov centered lower chest upper abd in midline, not sure is assoc with palp but not nausea, no change with eating or activity.  Pt denies any significant sore throat, dysphagia, itching, sneezing,  nasal congestion or excess secretions,  fever, chills, sweats, unintended wt loss, pleuritic or exertional cp, hempoptysis,  change in activity tolerance  orthopnea pnd or leg swelling Pt also denies any obvious fluctuation in symptoms with weather or environmental change or other alleviating or aggravating factors.         . .   Current Medications (verified): 1)  Losartan Potassium-Hctz 100-12.5 Mg Tabs (Losartan Potassium-Hctz) .Marland Kitchen.. 1 Once Daily 2)  Fluticasone Propionate 50 Mcg/act Susp (Fluticasone Propionate) .... 0-2 Puffs in The Morning, and 1-2 Puffs At Beditme 3)  Symbicort 160-4.5 Mcg/act Aero (Budesonide-Formoterol Fumarate) .... Inhale 2 Puffs Two Times A Day 4)  Nexium 40 Mg Cpdr (Esomeprazole Magnesium) .Marland Kitchen.. 1 Two Times A Day 5)  Multivitamins   Tabs (Multiple Vitamin) .... Once Daily 6)  Adult Aspirin Low Strength 81 Mg  Tbdp (Aspirin) .... Take 1 Tablet By Mouth Once A Day 7)  Caltrate 600 1500 Mg  Tabs (Calcium Carbonate) .... Take 1 Tablet By Mouth Once A Day 8)  Famotidine 40 Mg Tabs (Famotidine) .... Take 1 Tab By Mouth At Bedtime 9)  Mucinex Dm 30-600 Mg  Tb12 (Dextromethorphan-Guaifenesin) .Marland Kitchen.. 1-2 Every 12 Hours As Needed For Thick Mucus, Congestion and Cough 10)  Flutter Valve .... Use Several Times A Day As Needed For Thick Mucus, Congestion, and Cough 11)  Xopenex Hfa 45 Mcg/act  Aero (Levalbuterol Tartrate) .... 2 Puffs Every 4 Hrs As Needed For Wheezing/shortness of Breath 12)  Afrin Nasal Spray 0.05 % Soln (Oxymetazoline Hcl) .... 2 Puffs Twice Daily For  5 Days As Needed For Nasal Congestion 13)  Nasal Moisturizer 0.65 % Soln (Saline) .... Use Several Times A Day As Needed For Nasal Congestion 14)  Zyrtec Allergy 10 Mg Tabs (Cetirizine Hcl) .... Take 1 Tab By Mouth At Bedtime As Needed 15)  Benzonatate 100 Mg Caps (Benzonatate) .Marland Kitchen.. 1 Capsule By Mouth Every 8 Hours As Needed For Cough 16)  Hydrocodone-Acetaminophen 5-500 Mg Tabs (Hydrocodone-Acetaminophen) .Marland Kitchen.. 1 Tabs Every 4 Hours As Needed For Pain or Cough  Allergies (verified): 1)  ! Xanax 2)  ! Pcn 3)  ! Sulfa 4)  ! *  Avelox 5)  ! Reglan 6)  * Tramadol  Past History:  Past Medical History: Bronchiectasis     - CT chest and sinus September 08, 2009 >> mild diffuse bronchiectasis with mild sphenoid sinusitis     - HFA poor September 05, 2009 > 75 % with coaching January 30, 2010    Hypertension GERD..................................................................................Marland KitchenEdwards   - Pos EGD 05/13/08 Complex med regimen ---Meds reviewed with pt education and computerized med calendar completed/adjusted October 17, 2009   Vital Signs:  Patient profile:   65 year old female Weight:      125 pounds BMI:     22.95 O2 Sat:      93 % on Room air Temp:     97.9 degrees F oral Pulse rate:   103 / minute BP sitting:   144 / 74  (left arm)  Vitals Entered By: Vernie Murders (August 27, 2010 10:28 AM)  O2 Flow:  Room air  Physical Exam  Additional Exam:  Amb wf nad very anxious/ nad  wt 1 132  October 04, 2008 > 129 July 03 2009 >    > 126 January 30, 2010 > 125 August 27, 2010  HEENT mild turbinate edema, non-tender sinus, clear discharge.  Oropharynx no thrush or excess pnd or cobblestoning.  No JVD or cervical adenopathy. Mild accessory muscle hypertrophy. Trachea midline, nl thryroid. Chest was hyperinflated by percussion with diminished breath sounds in bases ,no wheezing.  Regular rate and rhythm without murmur gallop or rub or increase P2.  No edema Abd: no hsm, nl excursion. Ext warm without cyanosis or clubbing.   Creatine Kinase           66 U/L                      7-177   Creatine Kinase Mb        1.5 ng/mL                   0.3-4.0   Relative Index            2.3 calc                    0.0-2.5     Relative Index is invalid when CK is <100 U/L  Tests: (2) BMP (METABOL)   Sodium                    141 mEq/L                   135-145   Potassium                 3.7 mEq/L                   3.5-5.1   Chloride  101 mEq/L                   96-112   Carbon Dioxide             31 mEq/L                    19-32   Glucose                   91 mg/dL                    21-30   BUN                       15 mg/dL                    8-65   Creatinine                0.7 mg/dL                   7.8-4.6   Calcium                   9.7 mg/dL                   9.6-29.5   GFR                       96.93 mL/min                >60.00  Tests: (3) CBC Platelet w/Diff (CBCD)   White Cell Count          9.9 K/uL                    4.5-10.5   Red Cell Count       [H]  5.17 Mil/uL                 3.87-5.11   Hemoglobin           [H]  15.7 g/dL                   28.4-13.2   Hematocrit                45.6 %                      36.0-46.0   MCV                       88.2 fl                     78.0-100.0   MCHC                      34.5 g/dL                   44.0-10.2   RDW                       13.2 %                      11.5-14.6   Platelet Count            225.0 K/uL  150.0-400.0   Neutrophil %              75.3 %                      43.0-77.0   Lymphocyte %              14.9 %                      12.0-46.0   Monocyte %                7.8 %                       3.0-12.0   Eosinophils%              1.7 %                       0.0-5.0   Basophils %               0.3 %                       0.0-3.0   Neutrophill Absolute      7.4 K/uL                    1.4-7.7   Lymphocyte Absolute       1.5 K/uL                    0.7-4.0   Monocyte Absolute         0.8 K/uL                    0.1-1.0  Eosinophils, Absolute                             0.2 K/uL                    0.0-0.7   Basophils Absolute        0.0 K/uL                    0.0-0.1   Impression & Recommendations:  Problem # 1:  BRONCHIECTASIS W/O ACUTE EXACERBATION (ICD-494.0)  doing much better despite complexity of care using present med calendar but not consistent with all meds including hs pecid which have helped her "chest quivering" early in am.   Each maintenance medication was reviewed in detail  including most importantly the difference between maintenance and as needed and under what circumstances the prns are to be used. This was done in the context of a medication calendar review which provided the patient with a user-friendly unambiguous mechanism for medication administration and reconciliation and provides an action plan for all active problems. It is critical that this be shown to every doctor  for modification during the office visit if necessary so the patient can use it as a working document. See instructions for specific recommendations   I spent extra time with the patient today explaining optimal mdi  technique.  This improved from  50-75%  Problem # 2:  CHEST PAIN (ICD-786.50) ? Noct gerd - see rec to stay on pepcid at hs  studies are all nl and symptoms completely non-specific. cannot shed any further light on her chest quivering complaint at this point ?  anxiety the most likely but dx of exclusion.  Medications Added to Medication List This Visit: 1)  Losartan Potassium-hctz 100-12.5 Mg Tabs (Losartan potassium-hctz) .Marland Kitchen.. 1 once daily  Other Orders: EKG w/ Interpretation (93000) TLB-Cardiac Panel ([id number]) TLB-BMP (Basic Metabolic Panel-BMET) (80048-METABOL) TLB-CBC Platelet - w/Differential (85025-CBCD) Est. Patient Level IV (19147) HFA Instruction [phone])  Patient Instructions: 1)  Work on inhaler technique:  relax and blow all the way out then take a nice smooth deep breath back in, triggering the inhaler at same time you start breathing in  2)  Resume Pepcid 20 mg one at bedtime as per calendar 3)  Return to office in 3 months, sooner if needed

## 2010-10-30 ENCOUNTER — Telehealth: Payer: Self-pay | Admitting: Internal Medicine

## 2010-10-30 NOTE — Telephone Encounter (Signed)
Called and advised pt sample left upfront for pick up  Carver Fila, CMA

## 2010-10-31 ENCOUNTER — Other Ambulatory Visit: Payer: Self-pay | Admitting: *Deleted

## 2010-10-31 MED ORDER — BUDESONIDE-FORMOTEROL FUMARATE 160-4.5 MCG/ACT IN AERO: 2.0000 | INHALATION_SPRAY | Freq: Two times a day (BID) | RESPIRATORY_TRACT | Status: DC

## 2010-11-19 ENCOUNTER — Ambulatory Visit (INDEPENDENT_AMBULATORY_CARE_PROVIDER_SITE_OTHER): Payer: Medicare Other | Admitting: Internal Medicine

## 2010-11-19 ENCOUNTER — Encounter: Payer: Self-pay | Admitting: Internal Medicine

## 2010-11-19 DIAGNOSIS — J479 Bronchiectasis, uncomplicated: Secondary | ICD-10-CM

## 2010-11-19 DIAGNOSIS — K219 Gastro-esophageal reflux disease without esophagitis: Secondary | ICD-10-CM

## 2010-11-19 NOTE — Assessment & Plan Note (Signed)
Doing much better    Each maintenance medication was reviewed in detail including most importantly the difference between maintenance and as needed and under what circumstances the prns are to be used.  Please see instructions for details which were reviewed in writing and the patient given a copy.  This was done in the context of a medication calendar review which provided the patient with a user-friendly unambiguous mechanism for medication administration and reconciliation and provides an action plan for all active problems. It is critical that this be shown to every doctor  for modification during the office visit if necessary so the patient can use it as a working document.

## 2010-11-19 NOTE — Assessment & Plan Note (Signed)
meds from med calendar reviewed

## 2010-11-19 NOTE — Progress Notes (Signed)
  Subjective:    Patient ID: Tonya [name], female    DOB: 10-31-1945, 65 y.o.   MRN: [id number]  HPI  39 yowf never smoker with documented bronchiectasis with chronic cough with all of her symptoms completely resolved on Symbicort 80/4.5 two puffs b.i.d. despite evidence of significant airflow obstruction by PFTs.(prev failed qvar).   .  January 30, 2010 ov 3 month followup. Pt states that she has not been doing much coughing. She c/o increased acid reflux but has been seeing Dr Randa Evens for this issue. rec no change   August 27, 2010 ov cc cough and sob better, felt quivering in chest 4 am resolved by time of ov centered lower chest upper abd in midline, not sure is assoc with palp but not nausea, no change with eating or activity .  1) Work on inhaler technique: relax and blow all the way out then take a nice smooth deep breath back in, triggering the inhaler at same time you start breathing in  2) Resume Pepcid 20 mg one at bedtime as per calendar   11/19/2010 ov/ Rolf Fells cc much better overall in terms of cough control, no more chest quivering, no increase chronic doe or noct symtoms.  Pt denies any significant sore throat, dysphagia, itching, sneezing,  nasal congestion or excess/ purulent secretions,  fever, chills, sweats, unintended wt loss, pleuritic or exertional cp, hempoptysis, orthopnea pnd or leg swelling.    Also denies any obvious fluctuation of symptoms with weather or environmental changes or other aggravating or alleviating factors.         Allergies    1) ! Xanax  2) ! Pcn  3) ! Sulfa  4) ! * Avelox  5) ! Reglan  6) * Tramadol   Past Medical History:  Bronchiectasis  - CT chest and sinus September 08, 2009 >> mild diffuse bronchiectasis with mild sphenoid sinusitis  - HFA poor September 05, 2009 > 75 % with coaching January 30, 2010  Hypertension  GERD..................................................................................Marland KitchenEdwards  - Pos EGD 05/13/08  Complex med  regimen  ---Meds reviewed with pt education and computerized med calendar completed/adjusted October 17, 2009             Review of Systems     Objective:   Physical Exam  Amb wf nad  / nad  wt 1 132 October 04, 2008 > 129 July 03 2009 > > 126 January 30, 2010 > 125 August 27, 2010 > 124 11/19/2010  HEENT mild turbinate edema, non-tender sinus, clear discharge. Oropharynx no thrush or excess pnd or cobblestoning. No JVD or cervical adenopathy. Mild accessory muscle hypertrophy. Trachea midline, nl thryroid. Chest was hyperinflated by percussion with diminished breath sounds in bases ,no wheezing. Regular rate and rhythm without murmur gallop or rub or increase P2. No edema Abd: no hsm, nl excursion. Ext warm without cyanosis or clubbing.        Assessment & Plan:

## 2010-11-19 NOTE — Patient Instructions (Addendum)
See Tammy NP w/in 3 months with all your medications, even over the counter meds, separated in two separate bags, the ones you take no matter what vs the ones you stop once you feel better and take only as needed when you feel you need them.   Tammy  will generate for you a new user friendly medication calendar that will put us all on the same page re: your medication use.     Without this process, it simply isn't possible to assure that we are providing  your outpatient care  with  the attention to detail we feel you deserve.   If we cannot assure that you're getting that kind of care,  then we cannot manage your problem effectively from this clinic.  Once you have seen Tammy and we are sure that we're all on the same page with your medication use she will arrange follow up with me.  

## 2010-12-18 NOTE — Assessment & Plan Note (Signed)
[facility]                             PULMONARY OFFICE NOTE   Tonya [name], Tonya [name]                   MRN:          [id number]  DATE:05/04/2007                            DOB:          1946-05-30    HISTORY:  A 65 year old white female who is actually all smiles when I  last saw her here in April with no significant cough.  Today she says  she has been coughing forever.  It turns out the cough exacerbated in  August after taking Boniva and consists of a nonproductive cough that  produces minimal yellow sputum with no pleuritic pain, orthopnea, or leg  swelling.   MEDICATIONS:  Reviewed using the medication calendar format that she  presented and is inventoried correctly in the column dated May 04, 2007.   PHYSICAL EXAMINATION:  GENERAL:  She is a chronically ill appearing,  depressed white female with hopeless, helpless, affect and attitude.  VITAL SIGNS:  Stable.  HEENT:  Unremarkable, oropharynx clear.  LUNGS:  Lung fields reveal diminished breath sounds with no true wheeze.  HEART:  Regular rate and rhythm without murmurs, rubs, or gallops.  ABDOMEN:  Soft and benign.  EXTREMITIES:  Warm without calf tenderness, cyanosis, or clubbing.   Chest x-ray is normal.   Saturation is 96% on room air.   IMPRESSION:  Bronchiectasis documented by previous CT scan with now  flare up of chronic cough that is more of an upper airway than a lower  airway problem.  This would strongly indicate reflux as an issue and is  supported by previous exacerbations that occurred while on  bisphosphonates.  There is no way to be 100% sure about this, but I did  offer the patient the possibility of switching over to IV  bisphosphonates  if in fact chronic bisphosphonates are felt to be  critical in terms of her management.   In the meantime, I am going to recommend also switching from Qvar to  Symbicort (because of it's effect of mucociliary function and  the  patient's inability to use a p.r.n. strategy of Xopenex effectively) and  spend extra time coaching her on optimal MDI technique today which she  improved to about 75% effectiveness.   FOLLOWUP:  Follow up will be in six weeks unless she is having trouble  on this regimen which was reviewed with her line by line in writing  using the medication calendar that she carries with her.    Charlaine Dalton. Sherene Sires, MD, [facility]  Electronically Signed   MBW/MedQ  DD: 05/04/2007  DT: 05/04/2007  Job #: [id number]

## 2010-12-18 NOTE — Assessment & Plan Note (Signed)
[facility]                             PULMONARY OFFICE NOTE   Tonya [name], Tonya [name]                   MRN:          [id number]  DATE:06/09/2007                            DOB:          09-01-1945    PULMONARY/FOLLOW UP OFFICE VISIT:   HISTORY OF PRESENT ILLNESS:  A 65 year old white female with documented  bronchiectasis with chronic cough with all of her symptoms completely  resolved on Symbicort 80/4.5 two puffs b.i.d.  She no longer feels the  need to use Xopenex, and has not even used any significant cough  suppression over the last several months.  She denies any pleuritic  pain, fever, chills, sweats, orthopnea, PND or leg swelling.   PHYSICAL EXAMINATION:  GENERAL:  She is a pleasant, ambulatory white  female in no acute distress.  VITAL SIGNS:  Stable.  Room air saturation 93% on room air.  HEENT:  Unremarkable.  Pharynx is clear.  LUNGS:  Lung fields revealed diminished breath sounds bilaterally with  no crackles on inspiration, no wheezes on expiration.  HEART:  Regular rhythm without murmurs, gallops, rubs.  ABDOMEN:  Soft, benign.  EXTREMITIES:  Warm without calf tenderness, cyanosis, clubbing or edema.   IMPRESSION:  Bronchiectasis with tendency to chronic cough that has  completely resolved on combination therapy with Symbicort 80/4.5 two  puffs b.i.d.  I therefore recommend that she continue on this regimen  indefinitely and reviewed with her each of her maintenance versus p.r.n.  medicines to make sure there was no confusion regarding her contingency  plans to use Guaifenex, flutter valve and Xopenex for the appropriate  symptoms.   FOLLOWUP:  Follow up will be every three months, sooner if needed.     Charlaine Dalton. Sherene Sires, MD, [facility]  Electronically Signed    MBW/MedQ  DD: 06/09/2007  DT: 06/10/2007  Job #: [id number]   cc:   Carma Leaven, DO

## 2010-12-21 NOTE — Assessment & Plan Note (Signed)
[facility]                               PULMONARY OFFICE NOTE   KATHRYNN, BACKSTROM                   MRN:          [id number]  DATE:04/10/2006                            DOB:          1946/06/05    HISTORY:  This is a 65 year old white female with an increased cough and  subjective wheeze when I last saw her on February 24, 2006, and recommended that  she increase Nexium to b.i.d. and hold Boniva.  I also spent time teaching  her optimal MDI technique which she improved to 75% with coaching.   She states she has been doing much better since her previous visit with no  significant dyspnea or cough.  She denies any fever, chills, sweats, , PND  or purulent sputum or need of any of her rescue or continues the  medications listed on her med sheet.   PERFORMATORY MEDICATIONS:  Please see facesheet, dated April 10, 2006,  with correlates nicely with her medication that she carries with her.   PHYSICAL EXAMINATION:  GENERAL:  She is a pleasant, ambulatory white female  in no acute distress.  VITAL SIGNS:  Stable.  HEENT:  Unremarkable.  LUNGS:  Clear.  Lung fields reveal minimal rhonchi bilaterally otherwise  adequate.  Regular rhythm without murmur, gallop, or rub.  ABDOMEN:  Soft, benign.  EXTREMITIES:  Warm without calf tenderness, cyanosis, clubbing, or edema.   PFTs were reviewed and indicated a FEV1 of only 0.86 which is 43% predicted  with a ratio of 36%.   IMPRESSION:  Severe air flow obstruction in this never smoker with  bronchiectasis with adequate control of all symptoms on her present regimen.  My first concern is that we had to stop Boniva empirically (the reverse of  an empiric trial) to see if this affected her cough.  I would like to add  this back but continue her on high-dose Nexium for now.  The second issue is  whether she should be back on a bronchodilator (I believe Advair actually  was contributing to cough).  If she  has any increased symptoms or need of  cough or dyspnea or need for Maxair, I would not hesitate to switch her back  to a combination inhaled steroid (to control the inflammation associated  with bronchiectasis) and a bronchodilator (to control the very small  reversible component more aggressively) with a long-acting bronchodilator,  for which I believe Symbicort would be a better choice than Advair based on  her adverse upper airway effect from the Advair DDI (especially since the  patient now appears to be better with MDI technique).   A follow up will be in 6 weeks in the context of medication reconciliation  visit with our nurse practitioner.  I will see her six weeks after that.                                   Charlaine Dalton. Sherene Sires, MD, [facility]   MBW/MedQ  DD:  04/10/2006  DT:  04/10/2006  Job #:  [id number] 

## 2010-12-21 NOTE — Assessment & Plan Note (Signed)
[facility]                               PULMONARY OFFICE NOTE   Tonya [name], FESS                   MRN:          [id number]  DATE:05/22/2006                            DOB:          1946-01-12    HISTORY OF PRESENT ILLNESS:  The patient is a 65 year old, white female  patient of Dr. Thurston Hole, who has a known history of bronchiectasis, recurrent  cough, who presents for a 6-week followup.  At last visit, the patient was  doing well and was essentially cough-free.  The patient had previously been  on Boniva and has been stopped due to a suspicion of upper airway  irritability and worsening reflux.  However, last visit, the patient was  doing well and Boniva was restarted.  However, the patient reports after  restarting Boniva, her hoarseness and cough did return.  The patient has not  repeated her Boniva dose this month.  The patient has brought all of her  medications in today for review and they are correct with our medication  list.   PAST MEDICAL HISTORY:  Reviewed.   CURRENT MEDICATIONS:  Reviewed.   PHYSICAL EXAM:  The patient is a pleasant female in no acute distress.  She is afebrile with stable vital signs.  O2 saturation is 94% on room air.  HEENT:  Unremarkable.  NECK:  Supple without adenopathy.  Lung sounds reveal a few scattered rhonchi.  CARDIAC:  Regular rate, rhythm.  ABDOMEN:  Soft.  UPPER EXTREMITIES:  Warm without any edema.   IMPRESSION AND PLAN:  1. Bronchiectasis with recurrent cough.  The patient will continue on her      current regimen.  May use her cough suppression regimen including      Guaifenex DM and Hydo-PC for cough.  The patient will stay off of      Boniva at this point and continue on her current regimen.  She will      recheck with Dr. Sherene Sires in 2 months or sooner if needed.  2. History of osteopenia with a prescription for Boniva.  She will need to      let her primary care physician know that she is  intolerant to      bisphosphonates.    ______________________________  Rubye Oaks, NP    ______________________________  Charlaine Dalton. Sherene Sires, MD, [facility]   TP/MedQ  DD:  05/22/2006 DT:  05/25/2006 Job #:  [id number]

## 2010-12-21 NOTE — H&P (Signed)
Tonya [name], [name]                      ACCOUNT NO.:  [id number]   MEDICAL RECORD NO.:  [id number]                   PATIENT TYPE:  INP   LOCATION:  1823                                 FACILITY:  [facility]   PHYSICIAN:  Shan Levans, M.D. [facility]            DATE OF BIRTH:  [birth year]   DATE OF ADMISSION:  08/01/2002  DATE OF DISCHARGE:                                HISTORY & PHYSICAL   CHIEF COMPLAINT:  Respiratory distress.   HISTORY OF PRESENT ILLNESS:  This is a 65 year old white female with a  history of chronic cough and vocal cord dysfunction syndrome,  gastroesophageal reflux disease, comes to the emergency room in transfer  from [facility] with increased shortness of breath, increasing  chest congestion, increasing cough.  The patient's symptoms have progressed  to the point where she requires admission.  Her saturations are 88% on room  air, and only 89% on 1 L.  The patient has had reflux symptoms for about a  year, now been ill since Christmas with a low-grade fever, coughing up thick  green purulent mucus, increasing wheezing, increasing sinus congestion.  She  did receive a flu vaccine this year.  Does have some sore throat, no aching.   PAST MEDICAL HISTORY:  Hypertension.   PAST SURGICAL HISTORY:  None.   ALLERGIES:  1. SULFA.  2. AVALOX.  3. PENICILLIN.  4. XANAX.  5. REGLAN.   MEDICATIONS:  1. Avalide 150/12.5 mg one q.d.  2. Premarin q.d.   PHYSICAL EXAMINATION:  VITAL SIGNS:  Saturation was 88% on room air.  Temperature 99.8, respirations 28, pulse 105, blood pressure 122/72.  GENERAL:  The patient is in mild respiratory distress.  Using accessory  muscles for respirations.  CHEST:  Expiratory wheeze with poor air movement.  CARDIAC:  Regular rate and rhythm without S3, normal S1 and S2.  ABDOMEN:  Soft, nontender, bowel sounds active.  EXTREMITIES:  No edema or clubbing.  NEUROLOGIC:  Intact.  HEENT:  No jugular venous distention,  oropharynx clear.  NECK:  Supple.   LABORATORY DATA:  Chest x-ray showed no active disease.  Other labs pending  at the time of this dictation.   IMPRESSION:  Asthmatic bronchitic exacerbation with increased mucus plugging  and hypoxemia in a patient with vocal cord dysfunction syndrome and  gastroesophageal reflux disease.   RECOMMENDATIONS:  Begin IV Solu-Medrol, IV Rocephin.  Check sputum C&S and  Gram stain.  Give nebulizer treatments.  Give oxygen.  Double up Protonix to  40 mg b.i.d.  Admit to a regular room.                                               Shan Levans, M.D. [facility]    PW/MEDQ  D:  08/01/2002  T:  08/01/2002  Job:  [id number]   cc:   Charlaine Dalton. Sherene Sires, M.D. [facility]  [address]  [place]  [place] [address]  Fax: 1

## 2010-12-21 NOTE — Assessment & Plan Note (Signed)
[facility]                               PULMONARY OFFICE NOTE   KATHLYNE, LOUD                   MRN:          [id number]  DATE:02/24/2006                            DOB:          1945/08/27    HISTORY:  This is a 65 year old white female with evidence of bronchiectasis  by CT scan in 2003 and chronic air-flow obstruction by PFTs without  significant reversible component documented 02/24/2004.  She has been  maintained on Qvar 40 - 2 puffs b.i.d. after developing a cough on Advair  and had improved until the last month when she is having now increasing  coughing, daytime cough which is mostly dry in nature and frequent throat  clearing.  These are symptoms of what she had on Advair.  She denies any  sputum production, nocturnal exacerbation of cough or significant dyspnea  with exertion.   EXAMINATION:  GENERAL:  She is a depressed appearing ambulatory white female  with somewhat of  hopeless, helpless affect and attitude.  VITAL SIGNS:  She is afebrile, stable vital signs.  HEENT:  Unremarkable.  Pharynx clear.  There is no excessive postnasal drip  or cobblestoning.  NECK:  Supple without cervical adenopathy or tenderness.  Trachea is  midline.  LUNGS:  Lung fields show a few rhonchi on inspiration greater than  expiration, left greater than right base.  HEART:  Regular rate and rhythm without murmur, gallop or rub.  ABDOMEN:  Soft, benign.  EXTREMITIES:  Without calf tenderness, cyanosis, or clubbing or edema.   Hemoglobin oxygen saturation 95%.   IMPRESSION:  1.  Recurrent cough in a patient who has chronic air-flow obstruction with      bronchiectasis by computerized tomography scan.  She had done better on      Qvar with the main concern being that of daytime cough and throat      clearing which are probably not Qvar related but may well be related to      poorly controlled reflux, which is a potential unifying diagnosis in  a      patient with bronchiectasis.   FOLLOWUP:  1.  First I spent extra time teaching her to use Qvar optimally in terms of      smooth, deep inspiratory technique.  She improved from 50 to 75% with      coaching.  2.  I recommended she stop Boniva on a trial basis for the next six weeks      and increase Nexium at 40 mg b.i.d. Her instructions already say to do      this but she did not think the problem was reflux because she does not      have heartburn.  (I spent a time educating her regarding the fact that      much of her upper airway symptoms could very well be reflux and      the best way to prove it is to double the dose of Nexium to see if it      goes away) .  3.  Followup will be in six  weeks to assess response to the above measures.                                   Charlaine Dalton. Sherene Sires, MD, [facility]   MBW/MedQ  DD:  02/25/2006  DT:  02/25/2006  Job #:  [id number]   cc:   Dellis Anes. Idell Pickles, MD

## 2010-12-21 NOTE — Assessment & Plan Note (Signed)
[facility]                             PULMONARY OFFICE NOTE   ROSELIN, WIEMANN                   MRN:          [id number]  DATE:08/14/2006                            DOB:          1946/05/19    PULMONARY/FOLLOWUP OFFICE VISIT   HISTORY:  A 65 year old white female, never smoker, with chronic air  flow obstruction by PFTs and CT evidence of bronchiectasis. She has a  variable component that we are treating as asthma, that was felt to be  possibly triggered by reflux and, indeed, when she started taking  Boniva, began having increased cough and hoarseness consistent with a  diagnosis of biphosphonate-induced reflux. She is back to baseline now  off of Boniva but concerned about her bone density, for which she plans  to see Dr. Jobe Gibbon practice.   She does complain of dyspnea at the end of the day but has not used  Maxair as recommended p.r.n.  Her cough is ok controlled with  Guaifenex DM 2 b.i.d. p.r.n.   For full inventory of all medications, please see medication sheets,  dated August 14, 2006, which corresponds 100% with the medication  calendar she carries with her.   PHYSICAL EXAMINATION:  She is a pleasant appearing white female in no  acute distress.  She appears both anxious and a bit depressed, however.  HEENT: Unremarkable.  PHARYNX: Clear.  LUNGS FIELDS: Reveal minimal rhonchi, bilateral air movement is  adequate, minimum expiratory rhonchi bilaterally.  There is regular rate and rhythm without murmur, rub, gallop or increase  in P2.  ABDOMEN: Soft, benign.  EXTREMITIES: Warm without any calf tenderness, cyanosis, clubbing, or  edema.   Heme saturation 94% on room air.   IMPRESSION:  Bronchiectasis with an asthmatic component, relatively well  controlled presently on her regimen and a definite flare up  symptomatically on Boniva. This raises several different issues.  1. First, she appears to have a reflux component  that may contribute      to airways disease and, unfortunately, high doses of proton pump      inhibitor may contribute to bone density issues. Therefore, she      might be considered for either Evista or Forteo, but I would not      rechallenge her with bisphosphonates at this point.  2. She could probably do a little bit better if she were more      aggressive about treating the bronchospasm component of her      problem. Her metered dose inhaler technique was reviewed and      approaches only about 50% effectiveness. Before we shift gears,      therefore, I spent extra time teaching her optimal metered dose      inhaler technique, asked her to use the Qvar consistently and use      more aggressive bronchodilator in the form of Xopenex HFA to use 2      puffs every 4 hours as needed for symptoms of breathlessness. If      this does not work      to her satisfaction and she  is able to master metered dose inhaler,      I believe it would be reasonable to switch her from Qvar to      Symbicort on her next visit.     Charlaine Dalton. Sherene Sires, MD, [facility]  Electronically Signed    MBW/MedQ  DD: 08/14/2006  DT: 08/14/2006  Job #: [id number]   cc:   Dellis Anes. Idell Pickles, M.D.

## 2010-12-21 NOTE — Discharge Summary (Signed)
Tonya [name], Tonya [name]                      ACCOUNT NO.:  [id number]   MEDICAL RECORD NO.:  [id number]                   PATIENT TYPE:  INP   LOCATION:  5019                                 FACILITY:  [facility]   PHYSICIAN:  Charlaine Dalton. Sherene Sires, M.D. [facility]           DATE OF BIRTH:  [birth year]   DATE OF ADMISSION:  08/01/2002  DATE OF DISCHARGE:  08/04/2002                                 DISCHARGE SUMMARY   FINAL DIAGNOSES:  1. Bronchiectasis left lower lobe with acute exacerbation.  2. Suspected gastroesophageal reflux disease/vocal cord dysfunction.  3. Hypertension.   HISTORY:  Please see dictated H&P on December 28th.   HOSPITAL COURSE:  This patient has had a chronic cough but comes in with an  acute exacerbation said she had purulent sputum acute exacerbation since  Christmas with increasing dyspnea and sensation of chest congestion.  For  the first time she has had evidence of definite rhonchi on exam localized to  the left lower lobe with inspiratory components on admission that I thought  probably represented bronchiectasis which was indeed confirmed by CT  scanning, which also showed some nodular changes consistent with an airway  thickening consistent with possible underlying MAI.   She did respond, however, dramatically to institution of nebulizers and  steroids as well as antibiotics in the form of Maxipime and Cipro and had a  minimal rhonchi on exam at the time of discharge.  She was shaky when she  would walk but denied any dyspnea walking and saturating well on room air  prior to discharge.   CONDITION ON DISCHARGE:  Her discharge condition, therefore, is markedly  improved.   LABORATORY DATA:  Her sputum from December 28th showed no predominant  organisms and is still pending.  Her white count was 13,800 with a left  shift, and her potassium was 2.5 but was repeated and 3.7 on the 29th.   DISCHARGE MEDICATIONS:  She is therefore discharged home today on the  following regimen:  Cipro 750 mg b.i.d. for 7 days, guiafenesin DM two  b.i.d., Avalide 150/12.5 mg one daily, Premarin one daily, Nexium 40 mg  b.i.d. before eating, prednisone to take 20 mg per day until 100% better  then 1/2 daily until seen in the office in a week.  For severe cough, she  can use Mepergan Fortis one q.4 h.  We are also going to maintain her now on  Serevent one puff b.i.d. to improve mucous clearance and ask her to use the  ______ as well.   FOLLOW UP:  Will be in one week.   DIET AND ACTIVITY:  Will be normal.                                               Casimiro Needle B. Sherene Sires, M.D. [facility]  MBW/MEDQ  D:  08/04/2002  T:  08/04/2002  Job:  [id number]

## 2010-12-21 NOTE — Assessment & Plan Note (Signed)
[facility]                             PULMONARY OFFICE NOTE   Tonya, [name]                   MRN:          [id number]  DATE:11/05/2006                            DOB:          1946/06/16    HISTORY:  A 65 year old white female with a diagnosis of bronchiectasis  and asthma doing exceptionally well on her present regimen consisting of  Qvar 40 two puffs b.i.d. with rare, if ever, need for Xopenex and  minimum need for Guaifenex or antibiotic since her previous visit in  January 2008. In fact, she comes in all smiles stating she has done  better since her last visit than she has done in a long time and  attributes this to this better MDI technique, having reviewed this with  her in detail at her last visit.   For a full inventory of medications please see face sheet, dated  November 05, 2006.   PHYSICAL EXAMINATION:  GENERAL:  She is a very pleasant, ambulatory,  white female in no acute distress.  VITAL SIGNS:  She had stable vital signs.  HEENT:  Unremarkable. Oropharynx is clear.  NECK:  Supple without cervical adenopathy or tenderness. The trachea is  midline, no thyromegaly.  LUNGS:  Lung fields reveal minimum rhonchi, inspiratory greater then  expiratory, left greater than right bilaterally.  HEART:  Regular rate and rhythm without murmur, gallop or rub.  ABDOMEN:  Soft and benign.  EXTREMITIES:  Warm without calf tenderness, cyanosis, clubbing or edema.   Heme saturation 96% on room air.   IMPRESSION:  Bronchiectasis associated with mild asthma with the  inflammatory component so well controlled on Qvar that she does not need  anything for bronchospasm at this point clinically.   I did review each and every one of her contingency instructions as well  as the maintenance program in medication calendar format. I will see her  back in 6 months, sooner if needed.     Charlaine Dalton. Sherene Sires, MD, [facility]  Electronically Signed    MBW/MedQ  DD: 11/05/2006  DT: 11/05/2006  Job #: [id number]   cc:   Dellis Anes. Idell Pickles, M.D.

## 2010-12-21 NOTE — Op Note (Signed)
   Tonya [name], Tonya [name]                      ACCOUNT NO.:  [id number]   MEDICAL RECORD NO.:  [id number]                   PATIENT TYPE:  AMB   LOCATION:  ENDO                                 FACILITY:  [facility]   PHYSICIAN:  Casimiro Needle B. Sherene Sires, M.D. [facility]           DATE OF BIRTH:  1946-01-20   DATE OF PROCEDURE:  08/13/2002  DATE OF DISCHARGE:                                 OPERATIVE REPORT   PROCEDURE:  Fiberoptic bronchoscopy diagnostic with lavage.   INDICATIONS FOR PROCEDURE:  Ms. Lacomb is a 65 year old white female with  evidence of new onset bronchiectasis by CT scan with nodular changes  suggesting possible atypical microbacterial infection especially in the left  lower lobe. The patient agreed to the procedure after a full discussion of  the risks, benefits, and alternatives.   She was given a total of 5 mg of IV Versed and 75 mg IV Demerol while being  continuously monitored by surface ECG and oximetry and maintaining adequate  saturations on nasal prongs. She also received 1% lidocaine by updraft  Nebulizer.   The right naris was also prepared with 2% lidocaine jelly.   Using a standard flexible fiberoptic bronchoscope, the right naris was  easily cannulated with good visualization of the entire oropharynx and  larynx. The cords moved normally and there were no apparent upper airway  lesions.   Using an additional 1% lidocaine as needed, the entire tracheobronchial tree  was explored bilaterally with the following findings:   The trachea, carina and all the major airways opened widely at the  subsegmental level bilaterally. There were scattered mucoid secretions but  the airways were not nearly as impressive as the CT scan or history would  have suggested. The secretions were mucoid, minimally ropey and suctioned  easily free. The left lower lobe actually appeared relatively well preserved  and was not impressive at all visually. However, because the left lower  lobe  was where all of the findings were by CT scanning, I did perform a selective  lavage of the left lower lobe with adequate return for cytology, AFB, and  fungal, routine stain and culture.   The patient tolerated the procedure well.    IMPRESSION:  Bronchiectasis of unclear etiology possibly infected with  atypical organisms.   RECOMMENDATIONS:  Await BAL fluid.                                               Charlaine Dalton. Sherene Sires, M.D. [facility]    MBW/MEDQ  D:  08/13/2002  T:  08/13/2002  Job:  [id number]   cc:   Dellis Anes. Idell Pickles, M.D.  [address]  [place]  [place] [address]  Fax: [phone]

## 2011-02-18 ENCOUNTER — Ambulatory Visit (INDEPENDENT_AMBULATORY_CARE_PROVIDER_SITE_OTHER): Payer: Medicare Other | Admitting: Adult Health

## 2011-02-18 ENCOUNTER — Other Ambulatory Visit: Payer: Self-pay | Admitting: *Deleted

## 2011-02-18 ENCOUNTER — Encounter: Payer: Self-pay | Admitting: Adult Health

## 2011-02-18 VITALS — BP 138/70 | HR 66 | Temp 98.0°F | Ht 61.25 in | Wt 125.8 lb

## 2011-02-18 DIAGNOSIS — J479 Bronchiectasis, uncomplicated: Secondary | ICD-10-CM

## 2011-02-18 MED ORDER — CETIRIZINE HCL 10 MG PO TABS: 10.0000 mg | ORAL_TABLET | Freq: Every day | ORAL | Status: DC | PRN

## 2011-02-18 MED ORDER — OXYMETAZOLINE HCL 0.05 % NA SOLN: NASAL | Status: DC

## 2011-02-18 MED ORDER — FLUTTER DEVI: Status: DC

## 2011-02-18 MED ORDER — LEVOFLOXACIN 750 MG PO TABS: ORAL_TABLET | ORAL | Status: DC

## 2011-02-18 MED ORDER — DM-GUAIFENESIN ER 30-600 MG PO TB12: 1.0000 | ORAL_TABLET | Freq: Two times a day (BID) | ORAL | Status: DC | PRN

## 2011-02-18 MED ORDER — NASAL SALINE 0.65 % NA SOLN: NASAL | Status: DC

## 2011-02-18 MED ORDER — FAMOTIDINE 20 MG PO TABS: 20.0000 mg | ORAL_TABLET | Freq: Every day | ORAL | Status: DC

## 2011-02-18 NOTE — Progress Notes (Signed)
7.16.12 med calendar update. 

## 2011-02-18 NOTE — Assessment & Plan Note (Signed)
Compensated on present regimen  Patient's medications were reviewed today and patient education was given. Computerized medication calendar was adjusted/completed  follow up Dr. Wert  In 3 months   

## 2011-02-18 NOTE — Patient Instructions (Signed)
Continue on current regimen Keep up good work  Follow med calendar closely and bring to each visit.  follow up Dr. Sherene Sires  In 3 months and As needed

## 2011-02-18 NOTE — Progress Notes (Signed)
Subjective:    Patient ID: Tonya [name], female    DOB: 02/09/1946, 65 y.o.   MRN: [id number]  HPI  23 yowf never smoker with documented bronchiectasis with chronic cough with all of her symptoms completely resolved on Symbicort 80/4.5 two puffs b.i.d. despite evidence of significant airflow obstruction by PFTs.(prev failed qvar).   .  January 30, 2010 ov 3 month followup. Pt states that she has not been doing much coughing. She c/o increased acid reflux but has been seeing Dr Randa Evens for this issue. rec no change   August 27, 2010 ov cc cough and sob better, felt quivering in chest 4 am resolved by time of ov centered lower chest upper abd in midline, not sure is assoc with palp but not nausea, no change with eating or activity .  1) Work on inhaler technique: relax and blow all the way out then take a nice smooth deep breath back in, triggering the inhaler at same time you start breathing in  2) Resume Pepcid 20 mg one at bedtime as per calendar   11/19/2010 ov/ Wert cc much better overall in terms of cough control, no more chest quivering, no increase chronic doe or noct symtoms.  >no changes   02/18/2011 Follow up and med review  Pt returns for follow up and med review. We reviewed all her meds and organized them into her med calendar with pt education.  She appears to be doing well with her meds. She has had several changes with her meds to generic formulary . She is in the coverage gap with her rx coverage currently. Samples were provided.   She says she is doing well with her cough. Minimal cough. She is under some stress, Husband with Parkinson dx recently and also he had emergency appendetomy last month. She has had no flare of cough/congestion. NO abx use since last ov.        Allergies    1) ! Xanax  2) ! Pcn  3) ! Sulfa  4) ! * Avelox  5) ! Reglan  6) * Tramadol   Past Medical History:  Bronchiectasis  - CT chest and sinus September 08, 2009 >> mild diffuse  bronchiectasis with mild sphenoid sinusitis  - HFA poor September 05, 2009 > 75 % with coaching January 30, 2010  Hypertension  GERD..................................................................................Marland KitchenEdwards  - Pos EGD 05/13/08  Complex med regimen  ---Meds reviewed with pt education and computerized med calendar completed/adjusted October 17, 2009 , 02/18/2011             Review of Systems Constitutional:   No  weight loss, night sweats,  Fevers, chills, fatigue, or  lassitude.  HEENT:   No headaches,  Difficulty swallowing,  Tooth/dental problems, or  Sore throat,                No sneezing, itching, ear ache, nasal congestion, post nasal drip,   CV:  No chest pain,  Orthopnea, PND, swelling in lower extremities, anasarca, dizziness, palpitations, syncope.   GI  No heartburn, indigestion, abdominal pain, nausea, vomiting, diarrhea, change in bowel habits, loss of appetite, bloody stools.   Resp:      No coughing up of blood.  No change in color of mucus.  No wheezing.  No chest wall deformity  Skin: no rash or lesions.  GU: no dysuria, change in color of urine, no urgency or frequency.  No flank pain, no hematuria   MS:  No joint pain or  swelling.  No decreased range of motion.  No back pain.  Psych:  No change in mood or affect. No depression or anxiety.  No memory loss.    '    Objective:   Physical Exam  Amb wf nad  / nad  wt 1 132 October 04, 2008 > 129 July 03 2009 > > 126 January 30, 2010 > 125 August 27, 2010 > 124 11/19/2010 >125 02/18/2011  HEENT mild turbinate edema, non-tender sinus, clear discharge. Oropharynx no thrush or excess pnd or cobblestoning. No JVD or cervical adenopathy. Mild accessory muscle hypertrophy. Trachea midline, nl thryroid. Chest was hyperinflated by percussion with diminished breath sounds in bases ,no wheezing. Regular rate and rhythm without murmur gallop or rub or increase P2. No edema Abd: no hsm, nl excursion. Ext warm without  cyanosis or clubbing.        Assessment & Plan:

## 2011-03-18 ENCOUNTER — Telehealth: Payer: Self-pay | Admitting: Internal Medicine

## 2011-03-18 NOTE — Telephone Encounter (Signed)
1 sample of Symbicort 160/4.5 left for pt to pick up. LMOM to let the pt know.

## 2011-05-20 ENCOUNTER — Ambulatory Visit (INDEPENDENT_AMBULATORY_CARE_PROVIDER_SITE_OTHER): Payer: Medicare Other | Admitting: Internal Medicine

## 2011-05-20 ENCOUNTER — Encounter: Payer: Self-pay | Admitting: Internal Medicine

## 2011-05-20 VITALS — BP 120/68 | HR 70 | Temp 98.3°F | Ht 61.75 in | Wt 124.0 lb

## 2011-05-20 DIAGNOSIS — J479 Bronchiectasis, uncomplicated: Secondary | ICD-10-CM

## 2011-05-20 MED ORDER — DOXYCYCLINE HYCLATE 100 MG PO TABS: 100.0000 mg | ORAL_TABLET | Freq: Two times a day (BID) | ORAL | Status: DC

## 2011-05-20 MED ORDER — LEVOFLOXACIN 750 MG PO TABS: ORAL_TABLET | ORAL | Status: DC

## 2011-05-20 NOTE — Patient Instructions (Addendum)
Go ahead and use the doxycyline 100mg  twice daily x 10 days and if not effective, go with the levaquin as per calendar  Please schedule a follow up visit in 3 months but call sooner if needed

## 2011-05-20 NOTE — Progress Notes (Signed)
Subjective:    Patient ID: Tonya [name], female    DOB: [birth year], 65 y.o.   MRN: [id number]  HPI  15 yowf never smoker with documented bronchiectasis with chronic cough with all of her symptoms completely resolved on Symbicort 80/4.5 two puffs b.i.d. despite evidence of significant airflow obstruction by PFTs.(prev failed qvar).   .  January 30, 2010 ov 3 month followup. Pt states that she has not been doing much coughing. She c/o increased acid reflux but has been seeing Dr Randa Evens for this issue. rec no change   August 27, 2010 ov cc cough and sob better, felt quivering in chest 4 am resolved by time of ov centered lower chest upper abd in midline, not sure is assoc with palp but not nausea, no change with eating or activity .  1) Work on inhaler technique: relax and blow all the way out then take a nice smooth deep breath back in, triggering the inhaler at same time you start breathing in  2) Resume Pepcid 20 mg one at bedtime as per calendar   11/19/2010 ov/ Tonya [name] cc much better overall in terms of cough control, no more chest quivering, no increase chronic doe or noct symtoms.  >no changes   02/18/2011 Follow up and med review  Pt returns for follow up and med review. We reviewed all her meds and organized them into her med calendar with pt education.  She appears to be doing well with her meds. She has had several changes with her meds to generic formulary . She is in the coverage gap with her rx coverage currently. Samples were provided.  rec Continue on current regimen Follow med calendar closely and bring to each visit.  05/20/2011 f/u ov/Tonya [name] cc worse cough since mid Sept >  Has not activated levaquin with this flare due to cost concerns and breathing not really different than baseline  Sleeping ok without nocturnal  or early am exacerbation  of respiratory  c/o's or need for noct saba. Also denies any obvious fluctuation of symptoms with weather or environmental changes or  other aggravating or alleviating factors except as outlined above   ROS  At present neg for  any significant sore throat, dysphagia, itching, sneezing,  nasal congestion or excess/ purulent secretions,  fever, chills, sweats, unintended wt loss, pleuritic or exertional cp, hempoptysis, orthopnea pnd or leg swelling.  Also denies presyncope, palpitations, heartburn, abdominal pain, nausea, vomiting, diarrhea  or change in bowel or urinary habits, dysuria,hematuria,  rash, arthralgias, visual complaints, headache, numbness weakness or ataxia.           Allergies    1) ! Xanax  2) ! Pcn  3) ! Sulfa  4) ! * Avelox  5) ! Reglan  6) * Tramadol   Past Medical History:  Bronchiectasis  - CT chest and sinus September 08, 2009 >> mild diffuse bronchiectasis with mild sphenoid sinusitis  - HFA poor September 05, 2009 > 75 % with coaching January 30, 2010  Hypertension  GERD..................................................................................Marland KitchenEdwards  - Pos EGD 05/13/08  Complex med regimen  ---Meds reviewed with pt education and computerized med calendar completed/adjusted October 17, 2009 , 02/18/2011                      Objective:   Physical Exam  Amb wf nad  / nad  wt 1 132 October 04, 2008 > 129 July 03 2009 > > 126 January 30, 2010 > 125 August 27, 2010 >  124 05/20/2011   HEENT mild turbinate edema, non-tender sinus, clear discharge. Oropharynx no thrush or excess pnd or cobblestoning. No JVD or cervical adenopathy. Mild accessory muscle hypertrophy. Trachea midline, nl thryroid. Chest was hyperinflated by percussion with diminished breath sounds in bases ,no wheezing. Regular rate and rhythm without murmur gallop or rub or increase P2. No edema Abd: no hsm, nl excursion. Ext warm without cyanosis or clubbing.        Assessment & Plan:

## 2011-05-21 ENCOUNTER — Encounter: Payer: Self-pay | Admitting: Internal Medicine

## 2011-05-21 NOTE — Assessment & Plan Note (Signed)
I had an extended discussion with the patient today lasting 15 to 20 minutes of a 25 minute visit on the following issues:  The proper method of use, as well as anticipated side effects, of this metered-dose inhaler are discussed and demonstrated to the patient. Improved to 90% with coaching    Each maintenance medication was reviewed in detail including most importantly the difference between maintenance and as needed and under what circumstances the prns are to be used.   This was done in the context of a medication calendar review which provided the patient with a user-friendly unambiguous mechanism for medication administration and reconciliation and provides an action plan for all active problems. It is critical that this be shown to every doctor  for modification during the office visit if necessary so the patient can use it as a working document.   Ok to try substituting doxy if effective and controlling flares, o/w use levaquin as prev rec only option.

## 2011-07-08 ENCOUNTER — Telehealth: Payer: Self-pay | Admitting: Internal Medicine

## 2011-07-08 NOTE — Telephone Encounter (Signed)
2 samples symbicort up front for pick up. Pt aware.

## 2011-08-21 ENCOUNTER — Ambulatory Visit (INDEPENDENT_AMBULATORY_CARE_PROVIDER_SITE_OTHER): Payer: Medicare Other | Admitting: Internal Medicine

## 2011-08-21 ENCOUNTER — Encounter: Payer: Self-pay | Admitting: Internal Medicine

## 2011-08-21 VITALS — BP 132/60 | HR 94 | Temp 97.4°F | Ht 62.0 in | Wt 125.2 lb

## 2011-08-21 DIAGNOSIS — J310 Chronic rhinitis: Secondary | ICD-10-CM

## 2011-08-21 DIAGNOSIS — J479 Bronchiectasis, uncomplicated: Secondary | ICD-10-CM

## 2011-08-21 MED ORDER — BUDESONIDE-FORMOTEROL FUMARATE 160-4.5 MCG/ACT IN AERO: 2.0000 | INHALATION_SPRAY | Freq: Two times a day (BID) | RESPIRATORY_TRACT | Status: DC

## 2011-08-21 MED ORDER — FLUTICASONE PROPIONATE 50 MCG/ACT NA SUSP: NASAL | Status: DC

## 2011-08-21 NOTE — Assessment & Plan Note (Signed)
I emphasized that nasal steroids have no immediate benefit in terms of improving symptoms.  To help them reached the target tissue, the patient should use Afrin two puffs every 12 hours applied one min before using the nasal steroids.  Afrin should be stopped after no more than 5 days.  If the symptoms worsen, Afrin can be restarted after 5 days off of therapy to prevent rebound congestion from overuse of Afrin.  I also emphasized that in no way are nasal steroids a concern in terms of "addiction".  

## 2011-08-21 NOTE — Progress Notes (Addendum)
Subjective:    Patient ID: Tonya [name], female    DOB: 12/16/1945   MRN: [id number]  HPI  38 yowf never smoker with documented bronchiectasis with chronic cough with all of her symptoms completely resolved on Symbicort 80/4.5 two puffs b.i.d. despite evidence of significant airflow obstruction by PFTs.(prev failed qvar).   .  January 30, 2010 ov 3 month followup. Pt states that she has not been doing much coughing. She c/o increased acid reflux but has been seeing Dr Randa Evens for this issue. rec no change   August 27, 2010 ov cc cough and sob better, felt quivering in chest 4 am resolved by time of ov centered lower chest upper abd in midline, not sure is assoc with palp but not nausea, no change with eating or activity .  1) Work on inhaler technique: relax and blow all the way out then take a nice smooth deep breath back in, triggering the inhaler at same time you start breathing in  2) Resume Pepcid 20 mg one at bedtime as per calendar   11/19/2010 ov/ Tonya [name] cc much better overall in terms of cough control, no more chest quivering, no increase chronic doe or noct symtoms.  >no changes   02/18/2011 Follow up and med review  Pt returns for follow up and med review. We reviewed all her meds and organized them into her med calendar with pt education.  She appears to be doing well with her meds. She has had several changes with her meds to generic formulary . She is in the coverage gap with her rx coverage currently. Samples were provided.  rec Continue on current regimen Follow med calendar closely and bring to each visit.  05/20/2011 f/u ov/Tonya [name] cc worse cough since mid Sept >  Has not activated levaquin with this flare due to cost concerns and breathing not really different than baseline rec Go ahead and use the doxycyline 100mg  twice daily x 10 days and if not effective, go with the levaquin as per calendar   08/21/2011 f/u ov/Tonya [name] cc cough overall much better, no limiting sob. Feels  doxy did as well as levaquin.  Main issue is nasal congestion, has not activated action plan on how to use flonase and afrin in combination.  Sleeping ok without nocturnal  or early am exacerbation  of respiratory  c/o's or need for noct saba. Also denies any obvious fluctuation of symptoms with weather or environmental changes or other aggravating or alleviating factors except as outlined above   ROS  At present neg for  any significant sore throat, dysphagia, itching, sneezing,fever, chills, sweats, unintended wt loss, pleuritic or exertional cp, hempoptysis, orthopnea pnd or leg swelling.  Also denies presyncope, palpitations, heartburn, abdominal pain, nausea, vomiting, diarrhea  or change in bowel or urinary habits, dysuria,hematuria,  rash, arthralgias, visual complaints, headache, numbness weakness or ataxia.           Allergies    1) ! Xanax  2) ! Pcn  3) ! Sulfa  4) ! * Avelox  5) ! Reglan  6) * Tramadol   Past Medical History:  Bronchiectasis  - CT chest and sinus September 08, 2009 >> mild diffuse bronchiectasis with mild sphenoid sinusitis  - HFA poor September 05, 2009 > 75 % with coaching January 30, 2010  Hypertension  GERD..................................................................................Marland KitchenEdwards  - Pos EGD 05/13/08  Complex med regimen  ---Meds reviewed with pt education and computerized med calendar completed/adjusted October 17, 2009 , 02/18/2011  Objective:   Physical Exam  Amb wf nad  / nad  wt 1 132 October 04, 2008 > 129 July 03 2009 > > 126 January 30, 2010 > 125 August 27, 2010 >  124 05/20/2011 > 08/21/2011  125  HEENT mild turbinate edema, non-tender sinus, clear discharge. Oropharynx no thrush or excess pnd or cobblestoning. No JVD or cervical adenopathy. Mild accessory muscle hypertrophy. Trachea midline, nl thryroid. Chest was hyperinflated by percussion with diminished breath sounds in bases ,no wheezing. Regular rate  and rhythm without murmur gallop or rub or increase P2. No edema Abd: no hsm, nl excursion. Ext warm without cyanosis or clubbing.        Assessment & Plan:

## 2011-08-21 NOTE — Patient Instructions (Signed)
I emphasized that nasal steroids(flonase) have no immediate benefit in terms of improving symptoms.  To help them reached the target tissue, the patient should use Afrin two puffs every 12 hours applied one min before using the nasal steroids.  Afrin should be stopped after no more than 5 days.  If the symptoms worsen, Afrin can be restarted after 5 days off of therapy to prevent rebound congestion from overuse of Afrin.  I also emphasized that in no way are nasal steroids a concern in terms of "addiction".   Please schedule a follow up visit in 6  months but call sooner if needed

## 2011-08-21 NOTE — Assessment & Plan Note (Signed)
Relatively well compensated on present regimen.    Each maintenance medication was reviewed in detail including most importantly the difference between maintenance and as needed and under what circumstances the prns are to be used.  Please see instructions for details which were reviewed in writing and the patient given a copy.

## 2011-12-31 ENCOUNTER — Other Ambulatory Visit: Payer: Self-pay | Admitting: Gastroenterology

## 2012-02-12 ENCOUNTER — Ambulatory Visit (INDEPENDENT_AMBULATORY_CARE_PROVIDER_SITE_OTHER): Payer: Medicare Other | Admitting: Internal Medicine

## 2012-02-12 ENCOUNTER — Encounter: Payer: Self-pay | Admitting: Internal Medicine

## 2012-02-12 ENCOUNTER — Ambulatory Visit (INDEPENDENT_AMBULATORY_CARE_PROVIDER_SITE_OTHER)
Admission: RE | Admit: 2012-02-12 | Discharge: 2012-02-12 | Disposition: A | Discharge status: Home / Self Care | Payer: Medicare Other | Source: Ambulatory Visit | Attending: Internal Medicine | Admitting: Internal Medicine

## 2012-02-12 VITALS — BP 140/68 | HR 70 | Temp 98.1°F | Ht 61.0 in | Wt 124.0 lb

## 2012-02-12 DIAGNOSIS — J479 Bronchiectasis, uncomplicated: Secondary | ICD-10-CM

## 2012-02-12 DIAGNOSIS — J310 Chronic rhinitis: Secondary | ICD-10-CM

## 2012-02-12 NOTE — Patient Instructions (Addendum)
See calendar for specific medication instructions and bring it back for each and every office visit for every healthcare provider you see.  Without it,  you may not receive the best quality medical care that we feel you deserve.  You will note that the calendar groups together  your maintenance  medications that are timed at particular times of the day.  Think of this as your checklist for what your doctor has instructed you to do until your next evaluation to see what benefit  there is  to staying on a consistent group of medications intended to keep you well.  The other group at the bottom is entirely up to you to use as you see fit  for specific symptoms that may arise between visits that require you to treat them on an as needed basis.  Think of this as your action plan or "what if" list.   Separating the top medications from the bottom group is fundamental to providing you adequate care going forward.    Please schedule a follow up visit in 3 months but call sooner if needed   Late add: Needs pfts and alpha one AT to be complete next ov

## 2012-02-12 NOTE — Assessment & Plan Note (Signed)
Relatively well compensated on present rx s flares  Needs pfts and alpha one AT to be complete, next ov

## 2012-02-12 NOTE — Assessment & Plan Note (Signed)
Adequate control on present rx, reviewed     Each maintenance medication was reviewed in detail including most importantly the difference between maintenance and as needed and under what circumstances the prns are to be used. This was done in the context of a medication calendar review which provided the patient with a user-friendly unambiguous mechanism for medication administration and reconciliation and provides an action plan for all active problems. It is critical that this be shown to every doctor  for modification during the office visit if necessary so the patient can use it as a working document.     

## 2012-02-12 NOTE — Progress Notes (Signed)
Subjective:    Patient ID: Tonya [name], female    DOB: 1946-03-06   MRN: [id number]  HPI  52 yowf never smoker with documented bronchiectasis with chronic cough with all of her symptoms completely resolved on Symbicort 80/4.5 two puffs b.i.d. despite evidence of significant airflow obstruction by PFTs.(prev failed qvar).   .  January 30, 2010 ov 3 month followup. Pt states that she has not been doing much coughing. She c/o increased acid reflux but has been seeing Dr Randa Evens for this issue. rec no change   August 27, 2010 ov cc cough and sob better, felt quivering in chest 4 am resolved by time of ov centered lower chest upper abd in midline, not sure is assoc with palp but not nausea, no change with eating or activity .  1) Work on inhaler technique: relax and blow all the way out then take a nice smooth deep breath back in, triggering the inhaler at same time you start breathing in  2) Resume Pepcid 20 mg one at bedtime as per calendar   11/19/2010 ov/ Wert cc much better overall in terms of cough control, no more chest quivering, no increase chronic doe or noct symtoms.  >no changes   02/18/2011 Follow up and med review  Pt returns for follow up and med review. We reviewed all her meds and organized them into her med calendar with pt education.  She appears to be doing well with her meds. She has had several changes with her meds to generic formulary . She is in the coverage gap with her rx coverage currently. Samples were provided.  rec Continue on current regimen Follow med calendar closely and bring to each visit.  05/20/2011 f/u ov/Wert cc worse cough since mid Sept >  Has not activated levaquin with this flare due to cost concerns and breathing not really different than baseline rec Go ahead and use the doxycyline 100mg  twice daily x 10 days and if not effective, go with the levaquin as per calendar   08/21/2011 f/u ov/Wert cc cough overall much better, no limiting sob. Feels  doxy did as well as levaquin.  Main issue is nasal congestion, has not activated action plan on how to use flonase and afrin in combination. rec I emphasized that nasal steroids(flonase) have no immediate benefit > use afrin x 5 days  02/12/2012 f/u ov/Wert nasal symptoms better main cc doe x cleaning the house,  Has to walk slow if goes distances x across a parking lot and using [facility] parking most of the time.   No unusual cough, purulent sputum or sinus/hb symptoms on present rx.     Sleeping ok without nocturnal  or early am exacerbation  of respiratory  c/o's or need for noct saba. Also denies any obvious fluctuation of symptoms with weather or environmental changes or other aggravating or alleviating factors except as outlined above   ROS  At present neg for  any significant sore throat, dysphagia, itching, sneezing,fever, chills, sweats, unintended wt loss, pleuritic or exertional cp, hempoptysis, orthopnea pnd or leg swelling.  Also denies presyncope, palpitations, heartburn, abdominal pain, nausea, vomiting, diarrhea  or change in bowel or urinary habits, dysuria,hematuria,  rash, arthralgias, visual complaints, headache, numbness weakness or ataxia.           Allergies    1) ! Xanax  2) ! Pcn  3) ! Sulfa  4) ! * Avelox  5) ! Reglan  6) * Tramadol   Past Medical  History:  Bronchiectasis  - CT chest and sinus September 08, 2009 >> mild diffuse bronchiectasis with mild sphenoid sinusitis  - HFA poor September 05, 2009 > 75 % with coaching January 30, 2010  Hypertension  GERD..................................................................................Marland KitchenEdwards  - Pos EGD 05/13/08  Complex med regimen  ---Meds reviewed with pt education and computerized med calendar completed/adjusted October 17, 2009 , 02/18/2011                      Objective:   Physical Exam  Amb wf nad  / nad  wt 1 132 October 04, 2008 > 129 July 03 2009 > > 126 January 30, 2010 > 125 August 27, 2010 >   124 05/20/2011 > 08/21/2011  125 > 02/12/2012 124  HEENT mild turbinate edema, non-tender sinus, clear discharge. Oropharynx no thrush or excess pnd or cobblestoning. No JVD or cervical adenopathy. Mild accessory muscle hypertrophy. Trachea midline, nl thryroid. Chest was hyperinflated by percussion with diminished breath sounds in bases ,no wheezing. Regular rate and rhythm without murmur gallop or rub or increase P2. No edema Abd: no hsm, nl excursion. Ext warm without cyanosis or clubbing.      CXR  02/12/2012 :  No interval change with chronic bronchiectasis.     Assessment & Plan:

## 2012-02-13 ENCOUNTER — Telehealth: Payer: Self-pay | Admitting: Internal Medicine

## 2012-02-13 NOTE — Telephone Encounter (Signed)
Notes Recorded by Nyoka Cowden, MD on 02/12/2012 at 3:10 PM Call pt: Reviewed cxr and no acute change so no change in recommendations made at ov  I spoke with patient about results and she verbalized understanding and had no questions

## 2012-05-27 ENCOUNTER — Encounter: Payer: Self-pay | Admitting: Internal Medicine

## 2012-05-27 ENCOUNTER — Ambulatory Visit: Payer: Medicare Other

## 2012-05-27 ENCOUNTER — Ambulatory Visit (INDEPENDENT_AMBULATORY_CARE_PROVIDER_SITE_OTHER): Payer: Medicare Other | Admitting: Internal Medicine

## 2012-05-27 VITALS — BP 124/62 | HR 73 | Temp 98.3°F | Ht 61.0 in | Wt 126.0 lb

## 2012-05-27 DIAGNOSIS — J479 Bronchiectasis, uncomplicated: Secondary | ICD-10-CM

## 2012-05-27 MED ORDER — TIOTROPIUM BROMIDE MONOHYDRATE 18 MCG IN CAPS: 18.0000 ug | ORAL_CAPSULE | Freq: Every day | RESPIRATORY_TRACT | Status: DC

## 2012-05-27 NOTE — Progress Notes (Signed)
Subjective:    Patient ID: [name], female    DOB: 01/23/1946   MRN: [id number]  HPI  28 yowf never smoker with documented bronchiectasis with chronic cough with all of her symptoms completely resolved on Symbicort 80/4.5 two puffs b.i.d. despite evidence of significant airflow obstruction by PFTs.(prev failed qvar).   .  January 30, 2010 ov 3 month followup. Pt states that she has not been doing much coughing. She c/o increased acid reflux but has been seeing Dr Randa Evens for this issue. rec no change   August 27, 2010 ov cc cough and sob better, felt quivering in chest 4 am resolved by time of ov centered lower chest upper abd in midline, not sure is assoc with palp but not nausea, no change with eating or activity .  1) Work on inhaler technique: relax and blow all the way out then take a nice smooth deep breath back in, triggering the inhaler at same time you start breathing in  2) Resume Pepcid 20 mg one at bedtime as per calendar   11/19/2010 ov/ Tonya [name] cc much better overall in terms of cough control, no more chest quivering, no increase chronic doe or noct symtoms.  >no changes   02/18/2011 Follow up and med review  Pt returns for follow up and med review. We reviewed all her meds and organized them into her med calendar with pt education.  She appears to be doing well with her meds. She has had several changes with her meds to generic formulary . She is in the coverage gap with her rx coverage currently. Samples were provided.  rec Continue on current regimen Follow med calendar closely and bring to each visit.  05/20/2011 f/u ov/Tonya [name] cc worse cough since mid Sept >  Has not activated levaquin with this flare due to cost concerns and breathing not really different than baseline rec Go ahead and use the doxycyline 100mg  twice daily x 10 days and if not effective, go with the levaquin as per calendar   08/21/2011 f/u ov/Tonya [name] cc cough overall much better, no limiting sob. Feels  doxy did as well as levaquin.  Main issue is nasal congestion, has not activated action plan on how to use flonase and afrin in combination. rec I emphasized that nasal steroids(flonase) have no immediate benefit > use afrin x 5 days  02/12/2012 f/u ov/Tonya [name] nasal symptoms better main cc doe x cleaning the house,  Has to walk slow if goes distances x across a parking lot and using [facility] parking most of the time.     rec See calendar for specific medication instructions      05/27/2012 f/u ov/Tonya [name] using med calendar well cc indolent onset minimally progressive doe x uphill, vacuuming x sev months. No obvious daytime variabilty or assoc chronic cough or cp or chest tightness, subjective wheeze overt sinus or hb symptoms. No unusual exp hx    Some better p xopenex prn    Sleeping ok without nocturnal  or early am exacerbation  of respiratory  c/o's or need for noct saba. Also denies any obvious fluctuation of symptoms with weather or environmental changes or other aggravating or alleviating factors except as outlined above   ROS  The following are not active complaints unless bolded sore throat, dysphagia, dental problems, itching, sneezing,  nasal congestion or excess/ purulent secretions, ear ache,   fever, chills, sweats, unintended wt loss, pleuritic or exertional cp, hemoptysis,  orthopnea pnd or leg swelling, presyncope, palpitations, heartburn,  abdominal pain, anorexia, nausea, vomiting, diarrhea  or change in bowel or urinary habits, change in stools or urine, dysuria,hematuria,  rash, arthralgias, visual complaints, headache, numbness weakness or ataxia or problems with walking or coordination,  change in mood/affect or memory.               Allergies    1) ! Xanax  2) ! Pcn  3) ! Sulfa  4) ! * Avelox  5) ! Reglan  6) * Tramadol   Past Medical History:  Bronchiectasis  - CT chest and sinus September 08, 2009 >> mild diffuse bronchiectasis with mild sphenoid sinusitis  - HFA poor  September 05, 2009 > 75 % with coaching January 30, 2010  Hypertension  GERD..................................................................................Marland KitchenEdwards  - Pos EGD 05/13/08  Complex med regimen  ---Meds reviewed with pt education and computerized med calendar completed/adjusted October 17, 2009 , 02/18/2011                      Objective:   Physical Exam  Amb wf nad  / nad  wt 1 132 October 04, 2008 >    126 January 30, 2010 > 02/12/2012 124 > 05/27/2012  126  HEENT mild turbinate edema, non-tender sinus, clear discharge. Oropharynx no thrush or excess pnd or cobblestoning. No JVD or cervical adenopathy. Mild accessory muscle hypertrophy. Trachea midline, nl thryroid. Chest was hyperinflated by percussion with diminished breath sounds in bases ,no wheezing. Regular rate and rhythm without murmur gallop or rub or increase P2. No edema Abd: no hsm, nl excursion. Ext warm without cyanosis or clubbing.      CXR  02/12/2012 :  No interval change with chronic bronchiectasis.     Assessment & Plan:

## 2012-05-27 NOTE — Patient Instructions (Addendum)
spiriva one capsure 2 puffs each am and should be able to tell gradually that your wind is improving  See calendar for specific medication instructions and bring it back for each and every office visit for every healthcare provider you see.  Without it,  you may not receive the best quality medical care that we feel you deserve.  You will note that the calendar groups together  your maintenance  medications that are timed at particular times of the day.  Think of this as your checklist for what your doctor has instructed you to do until your next evaluation to see what benefit  there is  to staying on a consistent group of medications intended to keep you well.  The other group at the bottom is entirely up to you to use as you see fit  for specific symptoms that may arise between visits that require you to treat them on an as needed basis.  Think of this as your action plan or "what if" list.   Separating the top medications from the bottom group is fundamental to providing you adequate care going forward.   Please schedule a follow up visit in 3 months but call sooner if needed

## 2012-05-27 NOTE — Progress Notes (Signed)
PFT done today. 

## 2012-05-27 NOTE — Assessment & Plan Note (Addendum)
-   CT chest and sinus September 08, 2009 >> mild diffuse bronchiectasis with mild sphenoid sinusitis  -  HFA 90% 05/20/2011    - PFT's 05/27/2012 FEV1  0.67 (37%)  Ratio 36 and no better p B2 DLCO 71  She has a severe fixed obst pattern in never smoker with diffuse bronchiectasis on ct so  she should be tried on a copd regimen > could not use tudorza so started spiriva one capsule qam    Each maintenance medication was reviewed in detail including most importantly the difference between maintenance and as needed and under what circumstances the prns are to be used. This was done in the context of a medication calendar review which provided the patient with a user-friendly unambiguous mechanism for medication administration and reconciliation and provides an action plan for all active problems. It is critical that this be shown to every doctor  for modification during the office visit if necessary so the patient can use it as a working document.

## 2012-05-28 ENCOUNTER — Encounter: Payer: Self-pay | Admitting: Internal Medicine

## 2012-06-01 LAB — ALPHA-1 ANTITRYPSIN PHENOTYPE: A-1 Antitrypsin: 137 mg/dL (ref 83–199)

## 2012-06-02 ENCOUNTER — Encounter: Payer: Self-pay | Admitting: Internal Medicine

## 2012-06-03 ENCOUNTER — Encounter: Payer: Self-pay | Admitting: Internal Medicine

## 2012-06-24 ENCOUNTER — Other Ambulatory Visit: Payer: Self-pay | Admitting: Internal Medicine

## 2012-06-24 NOTE — Telephone Encounter (Signed)
Dr. Sherene Sires, do you want to refill Doxycycline 100mg  1 tablet BID for patient again?  Last refilled 05/20/11 #20 with 11 refills.  Last OV 05/27/12. Please advise thank you.

## 2012-07-20 ENCOUNTER — Telehealth: Payer: Self-pay | Admitting: Internal Medicine

## 2012-07-20 MED ORDER — BUDESONIDE-FORMOTEROL FUMARATE 160-4.5 MCG/ACT IN AERO: 2.0000 | INHALATION_SPRAY | Freq: Two times a day (BID) | RESPIRATORY_TRACT | Status: DC

## 2012-07-20 NOTE — Telephone Encounter (Signed)
Spoke with pt and notified of recs per MW She verbalized understanding and states nothing further needed

## 2012-07-20 NOTE — Telephone Encounter (Signed)
Spoke with pt She is requesting sample of symbicort- I have left 1 box up front for pick up She states that she does not always "hear the rattle" when doing spiriva and does not know if she is really getting the medicine She states that "the few times it did work, it may have helped" Please advise if you want her to continue, thanks!

## 2012-07-20 NOTE — Telephone Encounter (Signed)
Yes, for now, but can consider tudorza on next ov

## 2012-08-24 ENCOUNTER — Other Ambulatory Visit: Payer: Self-pay | Admitting: Internal Medicine

## 2012-08-26 ENCOUNTER — Ambulatory Visit (INDEPENDENT_AMBULATORY_CARE_PROVIDER_SITE_OTHER): Payer: Medicare Other | Admitting: Internal Medicine

## 2012-08-26 ENCOUNTER — Encounter: Payer: Self-pay | Admitting: Internal Medicine

## 2012-08-26 VITALS — BP 132/84 | HR 81 | Temp 97.5°F | Ht 61.75 in | Wt 122.0 lb

## 2012-08-26 DIAGNOSIS — J479 Bronchiectasis, uncomplicated: Secondary | ICD-10-CM

## 2012-08-26 MED ORDER — PANTOPRAZOLE SODIUM 40 MG PO TBEC: DELAYED_RELEASE_TABLET | ORAL | Status: DC

## 2012-08-26 MED ORDER — BUDESONIDE-FORMOTEROL FUMARATE 160-4.5 MCG/ACT IN AERO: 2.0000 | INHALATION_SPRAY | Freq: Two times a day (BID) | RESPIRATORY_TRACT | Status: DC

## 2012-08-26 MED ORDER — FLUTICASONE PROPIONATE 50 MCG/ACT NA SUSP: NASAL | Status: DC

## 2012-08-26 MED ORDER — LEVOFLOXACIN 750 MG PO TABS: 750.0000 mg | ORAL_TABLET | Freq: Every day | ORAL | Status: DC

## 2012-08-26 NOTE — Patient Instructions (Addendum)
See calendar for specific medication instructions and bring it back for each and every office visit for every healthcare provider you see.  Without it,  you may not receive the best quality medical care that we feel you deserve.  You will note that the calendar groups together  your maintenance  medications that are timed at particular times of the day.  Think of this as your checklist for what your doctor has instructed you to do until your next evaluation to see what benefit  there is  to staying on a consistent group of medications intended to keep you well.  The other group at the bottom is entirely up to you to use as you see fit  for specific symptoms that may arise between visits that require you to treat them on an as needed basis.  Think of this as your action plan or "what if" list.   Separating the top medications from the bottom group is fundamental to providing you adequate care going forward.    Please schedule a follow up visit in 4 months but call sooner if needed  

## 2012-08-26 NOTE — Assessment & Plan Note (Signed)
-   CT chest and sinus September 08, 2009 >> mild diffuse bronchiectasis with mild sphenoid sinusitis  -  HFA 90% 05/20/2011    - PFT's 05/27/2012 FEV1  0.67 (37%)  Ratio 36 and no better p B2 DLCO 71  - Spiriva started 05/27/2012   - Alpha One AT 05/27/12 > 135  Phenotype: MM  Adequate control on present rx, reviewed     Each maintenance medication was reviewed in detail including most importantly the difference between maintenance and as needed and under what circumstances the prns are to be used. This was done in the context of a medication calendar review which provided the patient with a user-friendly unambiguous mechanism for medication administration and reconciliation and provides an action plan for all active problems. It is critical that this be shown to every doctor  for modification during the office visit if necessary so the patient can use it as a working document.

## 2012-08-26 NOTE — Progress Notes (Signed)
Subjective:    Patient ID: Tonya [name], female    DOB: 1946/04/25   MRN: [id number]  HPI  17 yowf never smoker with documented bronchiectasis with chronic cough with all of her symptoms completely resolved on Symbicort 80/4.5 two puffs b.i.d. despite evidence of significant airflow obstruction by PFTs.(prev failed qvar).   .  January 30, 2010 ov 3 month followup. Pt states that she has not been doing much coughing. She c/o increased acid reflux but has been seeing Dr Randa Evens for this issue. rec no change   August 27, 2010 ov cc cough and sob better, felt quivering in chest 4 am resolved by time of ov centered lower chest upper abd in midline, not sure is assoc with palp but not nausea, no change with eating or activity .  1) Work on inhaler technique: relax and blow all the way out then take a nice smooth deep breath back in, triggering the inhaler at same time you start breathing in  2) Resume Pepcid 20 mg one at bedtime as per calendar   11/19/2010 ov/ Tonya [name] cc much better overall in terms of cough control, no more chest quivering, no increase chronic doe or noct symtoms.  >no changes   02/18/2011 Follow up and med review  Pt returns for follow up and med review. We reviewed all her meds and organized them into her med calendar with pt education.  She appears to be doing well with her meds. She has had several changes with her meds to generic formulary . She is in the coverage gap with her rx coverage currently. Samples were provided.  rec Continue on current regimen Follow med calendar closely and bring to each visit.  05/20/2011 f/u ov/Tonya [name] cc worse cough since mid Sept >  Has not activated levaquin with this flare due to cost concerns and breathing not really different than baseline rec Go ahead and use the doxycyline 100mg  twice daily x 10 days and if not effective, go with the levaquin as per calendar   08/21/2011 f/u ov/Tonya [name] cc cough overall much better, no limiting sob. Feels  doxy did as well as levaquin.  Main issue is nasal congestion, has not activated action plan on how to use flonase and afrin in combination. rec I emphasized that nasal steroids(flonase) have no immediate benefit > use afrin x 5 days  02/12/2012 f/u ov/Tonya [name] nasal symptoms better main cc doe x cleaning the house,  Has to walk slow if goes distances x across a parking lot and using [facility] parking most of the time.     rec See calendar for specific medication instructions      05/27/2012 f/u ov/Tonya [name] using med calendar well cc indolent onset minimally progressive doe x uphill, vacuuming x sev months. rec spiriva one capsure 2 puffs each am and should be able to tell gradually that your wind is improving See calendar for specific medicatio     08/26/2012 f/u ov/Tonya [name] cc overall doe better on spiriva,    No obvious daytime variabilty or assoc chronic cough or cp or chest tightness, subjective wheeze overt sinus or hb symptoms. No unusual exp hx    Some better p xopenex prn but not using it as much singce on spiriva.    Sleeping ok without nocturnal  or early am exacerbation  of respiratory  c/o's or need for noct saba. Also denies any obvious fluctuation of symptoms with weather or environmental changes or other aggravating or alleviating factors except as outlined above  ROS  The following are not active complaints unless bolded sore throat, dysphagia, dental problems, itching, sneezing,  nasal congestion or excess/ purulent secretions, ear ache,   fever, chills, sweats, unintended wt loss, pleuritic or exertional cp, hemoptysis,  orthopnea pnd or leg swelling, presyncope, palpitations, heartburn, abdominal pain, anorexia, nausea, vomiting, diarrhea  or change in bowel or urinary habits, change in stools or urine, dysuria,hematuria,  rash, arthralgias, visual complaints, headache, numbness weakness or ataxia or problems with walking or coordination,  change in mood/affect or memory.                 Allergies    1) ! Xanax  2) ! Pcn  3) ! Sulfa  4) ! * Avelox  5) ! Reglan  6) * Tramadol   Past Medical History:  Bronchiectasis  - CT chest and sinus September 08, 2009 >> mild diffuse bronchiectasis with mild sphenoid sinusitis  - HFA poor September 05, 2009 > 75 % with coaching January 30, 2010  Hypertension  GERD..................................................................................Marland KitchenEdwards  - Pos EGD 05/13/08  Complex med regimen  ---Meds reviewed with pt education and computerized med calendar completed/adjusted October 17, 2009 , 02/18/2011                      Objective:   Physical Exam  Amb wf nad  / nad  wt 1 132 October 04, 2008 >    126 January 30, 2010 > 02/12/2012 124 > 05/27/2012  126 > 122 08/26/2012   HEENT mild turbinate edema, non-tender sinus, clear discharge. Oropharynx no thrush or excess pnd or cobblestoning. No JVD or cervical adenopathy. Mild accessory muscle hypertrophy. Trachea midline, nl thryroid. Chest was hyperinflated by percussion with diminished breath sounds in bases ,no wheezing. Regular rate and rhythm without murmur gallop or rub or increase P2. No edema Abd: no hsm, nl excursion. Ext warm without cyanosis or clubbing.      CXR  02/12/2012 :  No interval change with chronic bronchiectasis.     Assessment & Plan:

## 2012-09-07 ENCOUNTER — Other Ambulatory Visit: Payer: Self-pay | Admitting: Internal Medicine

## 2012-10-15 ENCOUNTER — Ambulatory Visit (INDEPENDENT_AMBULATORY_CARE_PROVIDER_SITE_OTHER): Payer: Medicare Other | Admitting: Internal Medicine

## 2012-10-15 ENCOUNTER — Telehealth: Payer: Self-pay | Admitting: Internal Medicine

## 2012-10-15 ENCOUNTER — Ambulatory Visit (INDEPENDENT_AMBULATORY_CARE_PROVIDER_SITE_OTHER)
Admission: RE | Admit: 2012-10-15 | Discharge: 2012-10-15 | Disposition: A | Discharge status: Home / Self Care | Payer: Medicare Other | Source: Ambulatory Visit | Attending: Internal Medicine | Admitting: Internal Medicine

## 2012-10-15 ENCOUNTER — Encounter: Payer: Self-pay | Admitting: Internal Medicine

## 2012-10-15 VITALS — BP 142/70 | HR 84 | Temp 97.4°F | Ht 61.0 in | Wt 119.0 lb

## 2012-10-15 DIAGNOSIS — J479 Bronchiectasis, uncomplicated: Secondary | ICD-10-CM

## 2012-10-15 MED ORDER — HYDROCODONE-ACETAMINOPHEN 5-500 MG PO TABS: 1.0000 | ORAL_TABLET | ORAL | Status: DC | PRN

## 2012-10-15 MED ORDER — BENZONATATE 200 MG PO CAPS: 200.0000 mg | ORAL_CAPSULE | Freq: Three times a day (TID) | ORAL | Status: DC | PRN

## 2012-10-15 MED ORDER — LEVALBUTEROL TARTRATE 45 MCG/ACT IN AERO: 1.0000 | INHALATION_SPRAY | RESPIRATORY_TRACT | Status: DC | PRN

## 2012-10-15 NOTE — Telephone Encounter (Signed)
Spoke with pharmacy vicodin no longer comes in 5/500 Ok to change to  vicodin 5/325mg  ><1 tablet Q4 hours prn pain #40 Allergies  Allergen Reactions  . Alprazolam   . Metoclopramide Hcl   . Moxifloxacin   . Penicillins   . Sulfonamide Derivatives   . Tramadol     REACTION: made pt "feel funny"   Dr Sherene Sires Please advise . Thank you

## 2012-10-15 NOTE — Telephone Encounter (Signed)
Ok per MW to change Vicodin to 5/325 #40 I have contacted [facility] [address] in regards to vicodin 5/500mg  to vicodin 5/325mg ><1 tablet Q4 hours prn pain #40

## 2012-10-15 NOTE — Progress Notes (Addendum)
Subjective:    Patient ID: Tonya [name], female    DOB: April 29, 1946   MRN: [id number]  HPI  30 yowf never smoker with documented bronchiectasis with chronic cough with all of her symptoms completely resolved on Symbicort 80/4.5 two puffs b.i.d. despite evidence of significant airflow obstruction by PFTs.(prev failed qvar).   .  January 30, 2010 ov 3 month followup. Pt states that she has not been doing much coughing. She c/o increased acid reflux but has been seeing Dr Randa Evens for this issue. rec no change   August 27, 2010 ov cc cough and sob better, felt quivering in chest 4 am resolved by time of ov centered lower chest upper abd in midline, not sure is assoc with palp but not nausea, no change with eating or activity .  1) Work on inhaler technique: relax and blow all the way out then take a nice smooth deep breath back in, triggering the inhaler at same time you start breathing in  2) Resume Pepcid 20 mg one at bedtime as per calendar   11/19/2010 ov/ Wert cc much better overall in terms of cough control, no more chest quivering, no increase chronic doe or noct symtoms.  >no changes   02/18/2011 Follow up and med review  Pt returns for follow up and med review. We reviewed all her meds and organized them into her med calendar with pt education.  She appears to be doing well with her meds. She has had several changes with her meds to generic formulary . She is in the coverage gap with her rx coverage currently. Samples were provided.  rec Continue on current regimen Follow med calendar closely and bring to each visit.  05/20/2011 f/u ov/Wert cc worse cough since mid Sept >  Has not activated levaquin with this flare due to cost concerns and breathing not really different than baseline rec Go ahead and use the doxycyline 100mg  twice daily x 10 days and if not effective, go with the levaquin as per calendar   08/21/2011 f/u ov/Wert cc cough overall much better, no limiting sob. Feels  doxy did as well as levaquin.  Main issue is nasal congestion, has not activated action plan on how to use flonase and afrin in combination. rec I emphasized that nasal steroids(flonase) have no immediate benefit > use afrin x 5 days  02/12/2012 f/u ov/Wert nasal symptoms better main cc doe x cleaning the house,  Has to walk slow if goes distances x across a parking lot and using [facility] parking most of the time.     rec See calendar for specific medication instructions      05/27/2012 f/u ov/Wert using med calendar well cc indolent onset minimally progressive doe x uphill, vacuuming x sev months. rec spiriva one capsure 2 puffs each am and should be able to tell gradually that your wind is improving See calendar for specific medicatio     08/26/2012 f/u ov/Wert cc overall doe better on spiriva rec no change rx   10/15/2012 f/u ov/Wert cc much worse cough x 6 weeks, no longer following med calendar or action plans. No purulent sputum, cough is more day than night, more dry than wet   No obvious daytime variabilty or assoc sob cp or chest tightness, subjective wheeze overt sinus or hb symptoms. No unusual exp hx    Some better p xopenex prn but not using it as much singce on spiriva.    Sleeping ok without nocturnal  or early  am exacerbation  of respiratory  c/o's or need for noct saba. Also denies any obvious fluctuation of symptoms with weather or environmental changes or other aggravating or alleviating factors except as outlined above   ROS  The following are not active complaints unless bolded sore throat, dysphagia, dental problems, itching, sneezing,  nasal congestion or excess/ purulent secretions, ear ache,   fever, chills, sweats, unintended wt loss, pleuritic or exertional cp, hemoptysis,  orthopnea pnd or leg swelling, presyncope, palpitations, heartburn, abdominal pain, anorexia, nausea, vomiting, diarrhea  or change in bowel or urinary habits, change in stools or urine,  dysuria,hematuria,  rash, arthralgias, visual complaints, headache, numbness weakness or ataxia or problems with walking or coordination,  change in mood/affect or memory.               Allergies    1) ! Xanax  2) ! Pcn  3) ! Sulfa  4) ! * Avelox  5) ! Reglan  6) * Tramadol   Past Medical History:  Bronchiectasis  - CT chest and sinus September 08, 2009 >> mild diffuse bronchiectasis with mild sphenoid sinusitis  - HFA poor September 05, 2009 > 75 % with coaching January 30, 2010  Hypertension  GERD..................................................................................Marland KitchenEdwards  - Pos EGD 05/13/08  Complex med regimen  ---Meds reviewed with pt education and computerized med calendar completed/adjusted October 17, 2009 , 02/18/2011                      Objective:   Physical Exam  Amb wf nad  / nad  wt 1 132 October 04, 2008 >    126 January 30, 2010 > 02/12/2012 124 > 05/27/2012  126 > 122 08/26/2012 > 10/15/2012  119  HEENT mild turbinate edema, non-tender sinus, clear discharge. Oropharynx no thrush or excess pnd or cobblestoning. No JVD or cervical adenopathy. Mild accessory muscle hypertrophy. Trachea midline, nl thryroid. Chest was hyperinflated by percussion with diminished breath sounds in bases ,no wheezing. Regular rate and rhythm without murmur gallop or rub or increase P2. No edema Abd: no hsm, nl excursion. Ext warm without cyanosis or clubbing.      CXR  10/15/2012 :  There is a degree of underlying emphysematous change. There is lower lobe bronchiectatic change which is stable. There is no edema or consolidation. The heart size is normal. The pulmonary vascularity reflects underlying emphysema. No adenopathy. There is degenerative change in the spine.  .     Assessment & Plan:

## 2012-10-15 NOTE — Telephone Encounter (Signed)
Ok to change to vicodin 5/325 #40

## 2012-10-15 NOTE — Assessment & Plan Note (Addendum)
Lack of cough resolution could mean an alternative diagnosis (cough is not due to bronchiectasis but rather upper airway in nature), persistence of the disease state(we cannot clear her bronchiectasis) , or inadequacy of currently available therapy (eg no rx available for non-acid gerd)   I had an extended discussion with the patient today lasting 15 to 20 minutes of a 25 minute visit on the following issues:     Each maintenance medication was reviewed in detail including most importantly the difference between maintenance and as needed and under what circumstances the prns are to be used. This was done in the context of a medication calendar review which provided the patient with a user-friendly unambiguous mechanism for medication administration and reconciliation and provides an action plan for all active problems. It is critical that this be shown to every doctor  for modification during the office visit if necessary so the patient can use it as a working document.      Will try to eliminate cyclical coughing and bring her back for med reconciliation next step

## 2012-10-15 NOTE — Patient Instructions (Addendum)
As per med calendar  Plan A for cough > mucinex dm 1200 mg every 12 hours  Plan B add tession to plan A  Plan C add hydrocodone to B and A  Please remember to go to the  x-ray department downstairs for your tests - we will call you with the results when they are available.     See Tammy NP w/in 4weeks with all your medications, even over the counter meds, separated in two separate bags, the ones you take no matter what vs the ones you stop once you feel better and take only as needed when you feel you need them.   Tammy  will generate for you a new user friendly medication calendar that will put [place] all on the same page re: your medication use.     Without this process, it simply isn't possible to assure that we are providing  your outpatient care  with  the attention to detail we feel you deserve.   If we cannot assure that you're getting that kind of care,  then we cannot manage your problem effectively from this clinic.  Once you have seen Tammy and we are sure that we're all on the same page with your medication use she will arrange follow up with me.

## 2012-10-16 NOTE — Progress Notes (Signed)
Quick Note:  Spoke with pt and notified of results per Dr. Wert. Pt verbalized understanding and denied any questions.  ______ 

## 2012-11-12 ENCOUNTER — Other Ambulatory Visit: Payer: Self-pay | Admitting: Internal Medicine

## 2012-11-16 ENCOUNTER — Encounter: Payer: Self-pay | Admitting: Adult Health

## 2012-11-16 ENCOUNTER — Ambulatory Visit (INDEPENDENT_AMBULATORY_CARE_PROVIDER_SITE_OTHER): Payer: Medicare Other | Admitting: Adult Health

## 2012-11-16 VITALS — BP 114/62 | HR 73 | Temp 96.8°F | Ht 61.0 in | Wt 118.6 lb

## 2012-11-16 DIAGNOSIS — J479 Bronchiectasis, uncomplicated: Secondary | ICD-10-CM

## 2012-11-16 NOTE — Patient Instructions (Addendum)
Continue on current regimen Keep up good work  Follow med calendar closely and bring to each visit.  follow up Dr. Sherene Sires  In 4  months and As needed

## 2012-11-16 NOTE — Assessment & Plan Note (Signed)
Compensated on present regimen  Recent cough flare resolved.  Patient's medications were reviewed today and patient education was given. Computerized medication calendar was adjusted/completed  Plan  Continue on current regimen Keep up good work  Follow med calendar closely and bring to each visit.  follow up Dr. Sherene Sires  In 4  months and As needed

## 2012-11-16 NOTE — Progress Notes (Signed)
Subjective:    Patient ID: Tonya [name], female    DOB: 10-25-1945   MRN: [id number]  HPI 52 yowf never smoker with documented bronchiectasis with chronic cough with all of her symptoms completely resolved on Symbicort 80/4.5 two puffs b.i.d. despite evidence of significant airflow obstruction by PFTs.(prev failed qvar).   .  January 30, 2010 ov 3 month followup. Pt states that she has not been doing much coughing. She c/o increased acid reflux but has been seeing Dr Randa Evens for this issue. rec no change   August 27, 2010 ov cc cough and sob better, felt quivering in chest 4 am resolved by time of ov centered lower chest upper abd in midline, not sure is assoc with palp but not nausea, no change with eating or activity .  1) Work on inhaler technique: relax and blow all the way out then take a nice smooth deep breath back in, triggering the inhaler at same time you start breathing in  2) Resume Pepcid 20 mg one at bedtime as per calendar   11/19/2010 ov/ Wert cc much better overall in terms of cough control, no more chest quivering, no increase chronic doe or noct symtoms.  >no changes   02/18/2011 Follow up and med review  Pt returns for follow up and med review. We reviewed all her meds and organized them into her med calendar with pt education.  She appears to be doing well with her meds. She has had several changes with her meds to generic formulary . She is in the coverage gap with her rx coverage currently. Samples were provided.  rec Continue on current regimen Follow med calendar closely and bring to each visit.  05/20/2011 f/u ov/Wert cc worse cough since mid Sept >  Has not activated levaquin with this flare due to cost concerns and breathing not really different than baseline rec Go ahead and use the doxycyline 100mg  twice daily x 10 days and if not effective, go with the levaquin as per calendar   08/21/2011 f/u ov/Wert cc cough overall much better, no limiting sob. Feels doxy  did as well as levaquin.  Main issue is nasal congestion, has not activated action plan on how to use flonase and afrin in combination. rec I emphasized that nasal steroids(flonase) have no immediate benefit > use afrin x 5 days  02/12/2012 f/u ov/Wert nasal symptoms better main cc doe x cleaning the house,  Has to walk slow if goes distances x across a parking lot and using [facility] parking most of the time.     rec See calendar for specific medication instructions      05/27/2012 f/u ov/Wert using med calendar well cc indolent onset minimally progressive doe x uphill, vacuuming x sev months. rec spiriva one capsure 2 puffs each am and should be able to tell gradually that your wind is improving See calendar for specific medicatio  08/26/2012 f/u ov/Wert cc overall doe better on spiriva rec no change rx   10/15/2012 f/u ov/Wert cc much worse cough x 6 weeks, no longer following med calendar or action plans. No purulent sputum, cough is more day than night, more dry than wet   No obvious daytime variabilty or assoc sob cp or chest tightness, subjective wheeze overt sinus or hb symptoms. No unusual exp hx    Some better p xopenex prn but not using it as much singce on spiriva. >>cxr - chronic changes     11/16/2012 Follow up and med review  Patient returns for a one-month followup and medication review. We reviewed all her medications and organized them into a medication calendar.it appears the patient is taking her medications correctly.  Patient had a recent flare of her cough and was treated with a cough control regimen w/ mucinex dm and tessalon . Patient reports that she is feeling much improved and has returned back to her baseline. She denies any hemoptysis, orthopnea, PND, or leg swelling.           Allergies    1) ! Xanax  2) ! Pcn  3) ! Sulfa  4) ! * Avelox  5) ! Reglan  6) * Tramadol   Past Medical History:  Bronchiectasis  - CT chest and sinus September 08, 2009 >> mild  diffuse bronchiectasis with mild sphenoid sinusitis  - HFA poor September 05, 2009 > 75 % with coaching January 30, 2010  Hypertension  GERD..................................................................................Marland KitchenEdwards  - Pos EGD 05/13/08  Complex med regimen  ---Meds reviewed with pt education and computerized med calendar completed/adjusted October 17, 2009 , 02/18/2011 , 11/16/2012   ROS Neg except HPI      Objective:   Physical Exam  Amb wf nad  / nad  wt 1 132 October 04, 2008 >    126 January 30, 2010 > 02/12/2012 124 > 05/27/2012  126 > 122 08/26/2012 > 10/15/2012  119>118 11/16/2012   HEENT mild turbinate edema, non-tender sinus, clear discharge. Oropharynx no thrush or excess pnd or cobblestoning. No JVD or cervical adenopathy. Mild accessory muscle hypertrophy. Trachea midline, nl thryroid. Chest was hyperinflated by percussion with diminished breath sounds in bases ,no wheezing. Regular rate and rhythm without murmur gallop or rub or increase P2. No edema Abd: no hsm, nl excursion. Ext warm without cyanosis or clubbing.      CXR  10/15/2012 :  There is a degree of underlying emphysematous change. There is lower lobe bronchiectatic change which is stable. There is no edema or consolidation. The heart size is normal. The pulmonary vascularity reflects underlying emphysema. No adenopathy. There is degenerative change in the spine.  .     Assessment & Plan:

## 2012-12-01 ENCOUNTER — Other Ambulatory Visit: Payer: Self-pay | Admitting: Internal Medicine

## 2013-02-04 ENCOUNTER — Telehealth: Payer: Self-pay | Admitting: Internal Medicine

## 2013-02-04 NOTE — Telephone Encounter (Signed)
Samples are ready to be picked up. Pt is aware. States she will come by on Monday to pick them up.

## 2013-03-18 ENCOUNTER — Ambulatory Visit (INDEPENDENT_AMBULATORY_CARE_PROVIDER_SITE_OTHER): Payer: Self-pay | Admitting: Internal Medicine

## 2013-03-18 ENCOUNTER — Encounter: Payer: Self-pay | Admitting: Internal Medicine

## 2013-03-18 VITALS — BP 122/60 | HR 70 | Temp 97.8°F | Ht 61.25 in | Wt 118.0 lb

## 2013-03-18 DIAGNOSIS — J479 Bronchiectasis, uncomplicated: Secondary | ICD-10-CM

## 2013-03-18 DIAGNOSIS — J310 Chronic rhinitis: Secondary | ICD-10-CM

## 2013-03-18 NOTE — Assessment & Plan Note (Signed)
-   CT chest and sinus September 08, 2009 >> mild diffuse bronchiectasis with mild sphenoid sinusitis  -  HFA 90% 05/20/2011    - PFT's 05/27/2012 FEV1  0.67 (37%)  Ratio 36 and no better p B2 DLCO 71  - Spiriva started 05/27/2012  > d/c 10/15/2012   - [employer] One AT 05/27/12 > 135  Phenotype: MM   -med calendar 11/16/2012   Adequate control on present rx, reviewed > no change in rx needed      Each maintenance medication was reviewed in detail including most importantly the difference between maintenance and as needed and under what circumstances the prns are to be used. This was done in the context of a medication calendar review which provided the patient with a user-friendly unambiguous mechanism for medication administration and reconciliation and provides an action plan for all active problems. It is critical that this be shown to every doctor  for modification during the office visit if necessary so the patient can use it as a working document.       

## 2013-03-18 NOTE — Progress Notes (Signed)
  Subjective:    Patient ID: Tonya [name], female    DOB: [birth year]   MRN: [id number]  Brief patient profile:  51 yowf never smoker with documented bronchiectasis with chronic cough with all of her symptoms completely resolved on Symbicort 80/4.5 two puffs b.i.d. despite evidence of significant airflow obstruction by PFTs.(prev failed qvar).     HPI     03/18/2013 f/u ov/Fransico Sciandra re bronchiectassis/ "copd" Chief Complaint  Patient presents with  . Follow-up    Pt states overall doing well, she c/o PND and runny nose for the past month.   has not tried zyrtec  No nasty mucus No sob or need for saba on symbicort 160 2bid  No obvious daytime variabilty or assoc chronic cough or cp or chest tightness, subjective wheeze overt sinus or hb symptoms. No unusual exp hx or h/o childhood pna/ asthma or knowledge of premature birth.   Sleeping ok without nocturnal  or early am exacerbation  of respiratory  c/o's or need for noct saba. Also denies any obvious fluctuation of symptoms with weather or environmental changes or other aggravating or alleviating factors except as outlined above   Current Medications, Allergies, Past Medical History, Past Surgical History, Family History, and Social History were reviewed in [employer] record.  ROS  The following are not active complaints unless bolded sore throat, dysphagia, dental problems, itching, sneezing,  nasal congestion / purulent secretions, ear ache,   fever, chills, sweats, unintended wt loss, pleuritic or exertional cp, hemoptysis,  orthopnea pnd or leg swelling, presyncope, palpitations, heartburn, abdominal pain, anorexia, nausea, vomiting, diarrhea  or change in bowel or urinary habits, change in stools or urine, dysuria,hematuria,  rash, arthralgias, visual complaints, headache, numbness weakness or ataxia or problems with walking or coordination,  change in mood/affect or memory.                 Past Medical  History:  Bronchiectasis  - CT chest and sinus September 08, 2009 >> mild diffuse bronchiectasis with mild sphenoid sinusitis  - HFA poor September 05, 2009 > 75 % with coaching January 30, 2010  Hypertension  GERD..................................................................................Marland KitchenEdwards  - Pos EGD 05/13/08  Complex med regimen  ---Meds reviewed with pt education and computerized med calendar completed/adjusted October 17, 2009 , 02/18/2011 , 11/16/2012         Objective:   Physical Exam  Amb wf nad  / nad  wt 1 132 October 04, 2008 >    126 January 30, 2010 > 02/12/2012 124 > 05/27/2012  126 > 122 08/26/2012 > 10/15/2012  119>118 11/16/2012 > 118 03/18/2013   HEENT mild turbinate edema, non-tender sinus, clear discharge. Oropharynx no thrush or excess pnd or cobblestoning. No JVD or cervical adenopathy. Mild accessory muscle hypertrophy. Trachea midline, nl thryroid. Chest was hyperinflated by percussion with diminished breath sounds in bases ,no wheezing. Regular rate and rhythm without murmur gallop or rub or increase P2. No edema Abd: no hsm, nl excursion. Ext warm without cyanosis or clubbing.      CXR  10/15/2012 :  There is a degree of underlying emphysematous change. There is lower lobe bronchiectatic change which is stable. There is no edema or consolidation. The heart size is normal. The pulmonary vascularity reflects underlying emphysema. No adenopathy. There is degenerative change in the spine.  .     Assessment & Plan:

## 2013-03-18 NOTE — Patient Instructions (Addendum)
Each maintenance medication was reviewed in detail including most importantly the difference between maintenance and as needed and under what circumstances the prns are to be used. This was done in the context of a medication calendar review which provided the patient with a user-friendly unambiguous mechanism for medication administration and reconciliation and provides an action plan for all active problems. It is critical that this be shown to every doctor  for modification during the office visit if necessary so the patient can use it as a working document.      Remember to use the zyrtec for any flare of nasal symptoms and also double the dosing of flonase  Please schedule a follow up visit in 6  months but call sooner if needed

## 2013-03-18 NOTE — Assessment & Plan Note (Signed)
Poor control, reminded she has action plan that already addresses pnds ie zyrtec prn and max dose of flonase with afrin first x 5 days only  See the written copy of this report in the patient's paper medical record.  These results did not interface directly into the electronic medical record and are summarized here.

## 2013-05-07 ENCOUNTER — Telehealth: Payer: Self-pay | Admitting: Internal Medicine

## 2013-05-07 NOTE — Telephone Encounter (Signed)
Pt is aware that we do not have any samples at this time. She is going to check back with [place] later.

## 2013-05-20 ENCOUNTER — Telehealth: Payer: Self-pay | Admitting: Internal Medicine

## 2013-05-20 MED ORDER — BUDESONIDE-FORMOTEROL FUMARATE 160-4.5 MCG/ACT IN AERO: INHALATION_SPRAY | RESPIRATORY_TRACT | Status: DC

## 2013-05-20 NOTE — Telephone Encounter (Signed)
Samples at front. Pt advised. Carron Curie, CMA

## 2013-06-23 ENCOUNTER — Telehealth: Payer: Self-pay | Admitting: Internal Medicine

## 2013-06-23 NOTE — Telephone Encounter (Signed)
Spoke with pt. Advised her that we can't always supply her with samples every month. States that she has problems with affording Symbicort. Spoke with her about patient assistance and she is interested in proceeding with this. Samples are up front for pick up and the forms for patient assistance are also in the bag. Advised the pt to fill out her portion of the form and mail/fax the form to [employer]. She agreed and verbalized understanding.

## 2013-08-27 ENCOUNTER — Other Ambulatory Visit: Payer: Self-pay | Admitting: Obstetrics and Gynecology

## 2013-10-29 ENCOUNTER — Encounter (INDEPENDENT_AMBULATORY_CARE_PROVIDER_SITE_OTHER): Payer: Self-pay

## 2013-10-29 ENCOUNTER — Other Ambulatory Visit (INDEPENDENT_AMBULATORY_CARE_PROVIDER_SITE_OTHER): Payer: Medicare Other

## 2013-10-29 ENCOUNTER — Ambulatory Visit (INDEPENDENT_AMBULATORY_CARE_PROVIDER_SITE_OTHER): Payer: Medicare Other | Admitting: Internal Medicine

## 2013-10-29 ENCOUNTER — Encounter: Payer: Self-pay | Admitting: Internal Medicine

## 2013-10-29 ENCOUNTER — Ambulatory Visit (INDEPENDENT_AMBULATORY_CARE_PROVIDER_SITE_OTHER)
Admission: RE | Admit: 2013-10-29 | Discharge: 2013-10-29 | Disposition: A | Discharge status: Home / Self Care | Payer: Medicare Other | Source: Ambulatory Visit | Attending: Internal Medicine | Admitting: Internal Medicine

## 2013-10-29 VITALS — BP 146/80 | HR 68 | Temp 97.9°F | Ht 63.75 in | Wt 113.6 lb

## 2013-10-29 DIAGNOSIS — J9610 Chronic respiratory failure, unspecified whether with hypoxia or hypercapnia: Secondary | ICD-10-CM

## 2013-10-29 DIAGNOSIS — R0989 Other specified symptoms and signs involving the circulatory and respiratory systems: Secondary | ICD-10-CM

## 2013-10-29 DIAGNOSIS — I10 Essential (primary) hypertension: Secondary | ICD-10-CM

## 2013-10-29 DIAGNOSIS — R0609 Other forms of dyspnea: Secondary | ICD-10-CM

## 2013-10-29 DIAGNOSIS — R0600 Dyspnea, unspecified: Secondary | ICD-10-CM

## 2013-10-29 DIAGNOSIS — J479 Bronchiectasis, uncomplicated: Secondary | ICD-10-CM

## 2013-10-29 LAB — CBC WITH DIFFERENTIAL/PLATELET
BASOS ABS: 0 10*3/uL (ref 0.0–0.1)
Basophils Relative: 0.4 % (ref 0.0–3.0)
EOS ABS: 0.3 10*3/uL (ref 0.0–0.7)
Eosinophils Relative: 2.6 % (ref 0.0–5.0)
HCT: 48 % — ABNORMAL HIGH (ref 36.0–46.0)
Hemoglobin: 15.9 g/dL — ABNORMAL HIGH (ref 12.0–15.0)
LYMPHS PCT: 16.5 % (ref 12.0–46.0)
Lymphs Abs: 1.9 10*3/uL (ref 0.7–4.0)
MCHC: 33.1 g/dL (ref 30.0–36.0)
MCV: 89.4 fl (ref 78.0–100.0)
Monocytes Absolute: 0.8 10*3/uL (ref 0.1–1.0)
Monocytes Relative: 7.2 % (ref 3.0–12.0)
NEUTROS ABS: 8.3 10*3/uL — AB (ref 1.4–7.7)
Neutrophils Relative %: 73.3 % (ref 43.0–77.0)
PLATELETS: 233 10*3/uL (ref 150.0–400.0)
RBC: 5.37 Mil/uL — ABNORMAL HIGH (ref 3.87–5.11)
RDW: 13.1 % (ref 11.5–14.6)
WBC: 11.4 10*3/uL — ABNORMAL HIGH (ref 4.5–10.5)

## 2013-10-29 LAB — BASIC METABOLIC PANEL
BUN: 10 mg/dL (ref 6–23)
CALCIUM: 9.8 mg/dL (ref 8.4–10.5)
CO2: 35 mEq/L — ABNORMAL HIGH (ref 19–32)
Chloride: 100 mEq/L (ref 96–112)
Creatinine, Ser: 0.7 mg/dL (ref 0.4–1.2)
GFR: 85.32 mL/min (ref >60.00)
GLUCOSE: 86 mg/dL (ref 70–99)
Potassium: 3.8 mEq/L (ref 3.5–5.1)
SODIUM: 141 meq/L (ref 135–145)

## 2013-10-29 LAB — BRAIN NATRIURETIC PEPTIDE: PRO B NATRI PEPTIDE: 81 pg/mL (ref 0.0–100.0)

## 2013-10-29 MED ORDER — NEBIVOLOL HCL 5 MG PO TABS: ORAL_TABLET | ORAL | Status: DC

## 2013-10-29 MED ORDER — VALSARTAN-HYDROCHLOROTHIAZIDE 160-25 MG PO TABS: 1.0000 | ORAL_TABLET | Freq: Every day | ORAL | Status: DC

## 2013-10-29 NOTE — Progress Notes (Addendum)
Subjective:    Patient ID: Tonya [name], female    DOB: 1946-07-29   MRN: [id number]  Brief patient profile:  60 yowf never smoker with documented bronchiectasis with chronic cough with all of her symptoms improved on Symbicort 80/4.5 two puffs b.i.d. despite evidence of significant airflow obstruction by PFTs.(prev failed qvar).     HPI     03/18/2013 f/u ov/Marieelena Bartko re bronchiectassis/ "copd" Chief Complaint  Patient presents with  . Follow-up    Pt states overall doing well, she c/o PND and runny nose for the past month.   has not tried zyrtec  No nasty mucus No sob or need for saba on symbicort 160 2bid rec Prn zyrtec   10/29/2013 acute ov/Gerhart Ruggieri re: sob/ chest burning  Chief Complaint  Patient presents with  . Acute Visit    Pt c/o increased DOE for the past wk. She states that she gets SOB walking from room to room. She states she had "burning feeling" in chest and arms last night that lasted approx 10 min.   indolent onset gradually progressive doe assoc with More hoarse x one week assoc with ? HB despite max acid rx, no ex cp since episode of chest burning at rest s diaphoresis or nausea. Has not acitivated meds from action plan part of of the med calendar. Not really coughing much   No obvious daytime variabilty or assoc  chest tightness, subjective wheeze overt sinus or hb symptoms. No unusual exp hx or h/o childhood pna/ asthma or knowledge of premature birth.   Sleeping ok without nocturnal  or early am exacerbation  of respiratory  c/o's or need for noct saba. Also denies any obvious fluctuation of symptoms with weather or environmental changes or other aggravating or alleviating factors except as outlined above   Current Medications, Allergies, Past Medical History, Past Surgical History, Family History, and Social History were reviewed in [employer] record.  ROS  The following are not active complaints unless bolded sore throat, dysphagia,  dental problems, itching, sneezing,  nasal congestion / purulent secretions, ear ache,   fever, chills, sweats, unintended wt loss, pleuritic or exertional cp, hemoptysis,  orthopnea pnd or leg swelling, presyncope, palpitations, heartburn, abdominal pain, anorexia, nausea, vomiting, diarrhea  or change in bowel or urinary habits, change in stools or urine, dysuria,hematuria,  rash, arthralgias, visual complaints, headache, numbness weakness or ataxia or problems with walking or coordination,  change in mood/affect or memory.                 Past Medical History:  Bronchiectasis  - CT chest and sinus September 08, 2009 >> mild diffuse bronchiectasis with mild sphenoid sinusitis  - HFA poor September 05, 2009 > 75 % with coaching January 30, 2010  Hypertension  GERD..................................................................................Marland KitchenEdwards  - Pos EGD 05/13/08  Complex med regimen  ---Meds reviewed with pt education and computerized med calendar completed/adjusted October 17, 2009 , 02/18/2011 , 11/16/2012         Objective:   Physical Exam  Amb wf nad  / nad   wt 1 132 October 04, 2008 >    126 January 30, 2010 > 02/12/2012 124 > 05/27/2012  126 > 122 08/26/2012 > 10/15/2012  119>118 11/16/2012 > 118 03/18/2013 > 114 10/29/2013   HEENT mild turbinate edema, non-tender sinus, clear discharge. Oropharynx no thrush or excess pnd or cobblestoning. No JVD or cervical adenopathy. Mild accessory muscle hypertrophy. Trachea midline, nl thryroid. Chest was hyperinflated by percussion  with diminished breath sounds in bases ,no wheezing. Regular rate and rhythm without murmur gallop or rub or increase P2. No edema Abd: no hsm, nl excursion. Ext warm without cyanosis or clubbing.        cxr 3/281/5  No acute cardiopulmonary disease. Basilar interstitial prominence consistent with interstitial fibrosis noted. Chest is stable from prior exam.   Lab Results  Component Value Date   PROBNP 81.0  10/29/2013     Recent Labs Lab 10/29/13 1713  NA 141  K 3.8  CL 100  CO2 35*  BUN 10  CREATININE 0.7  GLUCOSE 86    Recent Labs Lab 10/29/13 1713  HGB 15.9*  HCT 48.0*  WBC 11.4*  PLT 233.0    10/29/13 ekg p walking one lap with desats but no cp >  No change from prev PRWP and no acute ischemic change Troponin I neg   Assessment & Plan:

## 2013-10-29 NOTE — Patient Instructions (Addendum)
Please remember to go to the lab and x-ray department downstairs for your tests - we will call you with the results when they are available.    Stop cozar  Start bystolic 5 mg daily and diovan 160 25 one daily as per med calendar   Please schedule a follow up office visit in 2 weeks, sooner if needed  Late add: call on 3/30 and if no better ov to start 02 and spriva now

## 2013-10-30 DIAGNOSIS — J9612 Chronic respiratory failure with hypercapnia: Secondary | ICD-10-CM | POA: Insufficient documentation

## 2013-10-30 DIAGNOSIS — I10 Essential (primary) hypertension: Secondary | ICD-10-CM | POA: Insufficient documentation

## 2013-10-30 LAB — TROPONIN I: Troponin I: 0.01 ng/mL (ref <0.06)

## 2013-10-30 LAB — D-DIMER, QUANTITATIVE: D-Dimer, Quant: 0.37 ug/mL-FEU (ref 0.00–0.48)

## 2013-10-30 NOTE — Assessment & Plan Note (Signed)
ekg s acute ischemic change. She does have poor r wave progression ? Old MI but note bnp << 100 so no chf.  Changed cozar to diovan due to concerns of an acei like effect described with cozar  Added bystolic in event she has angina, though this is unlikely s exertional component.

## 2013-10-30 NOTE — Assessment & Plan Note (Signed)
DDX of  difficult airways managment all start with A and  include Adherence, Ace Inhibitors, Acid Reflux, Active Sinus Disease, Alpha 1 Antitripsin deficiency, Anxiety masquerading as Airways dz,  ABPA,  allergy(esp in young), Aspiration (esp in elderly), Adverse effects of DPI,  Active smokers, plus two Bs  = Bronchiectasis and Beta blocker use..and one C= CHF  Adherence is always the initial "prime suspect" and is a multilayered concern that requires a "trust but verify" approach in every patient - starting with knowing how to use medications, especially inhalers, correctly, keeping up with refills and understanding the fundamental difference between maintenance and prns vs those medications only taken for a very short course and then stopped and not refilled.  - reminded to try xopenex prn as per action part of med calendar  ? chf > excluded by BNP Lab Results  Component Value Date   PROBNP 81.0 10/29/2013     ? For reasons that may related to vascular permability and nitric oxide pathways but not elevated  bradykinin levels (as seen with  ACEi use) losartan in the generic form has been reported now from mulitple sources  to cause a similar pattern of non-specific  upper airway symptoms as seen with acei.   This has not been reported with exposure to the other ARB's to date, so it seems reasonable for now to try either generic diovan or avapro if ARB needed or use an alternative class altogether.  For now will try diovan (see hbp) See:  Dewayne HatchAnn Allergy Asthma Immunol  2008: 101: p [phone]    ? Acid (or non-acid) GERD > always difficult to exclude as up to 75% of pts in some series report no assoc GI/ Heartburn symptoms> rec continue max (24h)  acid suppression and diet restrictions/ reviewed and instructions given in writing.

## 2013-10-30 NOTE — Assessment & Plan Note (Addendum)
-   10/29/13    Walked RA x one lap @ 185 stopped due to  desat to 82% with HCO3 35 c/w hypercarbia  Resting sats around 90% are ok considering that she likely has a chronic resp acidosis but if not improving will soon need 24 h 02   In meantime asked pt to do slow pacing and will schedule ono RA

## 2013-10-30 NOTE — Assessment & Plan Note (Signed)
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 Will consider adding LAMA and treating her more like copd than bronchiectasis.

## 2013-11-01 ENCOUNTER — Telehealth: Payer: Self-pay | Admitting: Internal Medicine

## 2013-11-01 NOTE — Telephone Encounter (Signed)
Spoke with leslie. She has already spoken with pt.

## 2013-11-01 NOTE — Progress Notes (Signed)
Quick Note:  LMTCB ______ 

## 2013-11-01 NOTE — Progress Notes (Signed)
Quick Note:  Spoke with pt and notified of results per Dr. Wert. Pt verbalized understanding and denied any questions.  ______ 

## 2013-11-12 ENCOUNTER — Encounter: Payer: Self-pay | Admitting: Adult Health

## 2013-11-12 ENCOUNTER — Ambulatory Visit (INDEPENDENT_AMBULATORY_CARE_PROVIDER_SITE_OTHER): Payer: Medicare Other | Admitting: Adult Health

## 2013-11-12 VITALS — BP 126/56 | HR 87 | Temp 97.7°F | Ht 63.75 in | Wt 113.6 lb

## 2013-11-12 DIAGNOSIS — J479 Bronchiectasis, uncomplicated: Secondary | ICD-10-CM

## 2013-11-12 DIAGNOSIS — J9610 Chronic respiratory failure, unspecified whether with hypoxia or hypercapnia: Secondary | ICD-10-CM

## 2013-11-12 MED ORDER — NEBIVOLOL HCL 5 MG PO TABS: ORAL_TABLET | ORAL | Status: DC

## 2013-11-12 NOTE — Patient Instructions (Signed)
Continue on current regimen  Follow up Dr. Sherene SiresWert  In 6 weeks and As needed   We will call with ONO results

## 2013-11-12 NOTE — Progress Notes (Signed)
Subjective:    Patient ID: Tonya [name], female    DOB: Dec 20, 1945   MRN: [id number]  Brief patient profile:  [age] yowf never smoker with documented bronchiectasis with chronic cough with all of her symptoms improved on Symbicort 80/4.5 two puffs b.i.d. despite evidence of significant airflow obstruction by PFTs.(prev failed qvar).     HPI     03/18/2013 f/u ov/Wert re bronchiectassis/ "copd" Chief Complaint  Patient presents with  . Follow-up    Pt states overall doing well, she c/o PND and runny nose for the past month.   has not tried zyrtec  No nasty mucus No sob or need for saba on symbicort 160 2bid rec Prn zyrtec   10/29/2013 acute ov/Wert re: sob/ chest burning  Chief Complaint  Patient presents with  . Acute Visit    Pt c/o increased DOE for the past wk. She states that she gets SOB walking from room to room. She states she had "burning feeling" in chest and arms last night that lasted approx 10 min.   indolent onset gradually progressive doe assoc with More hoarse x one week assoc with ? HB despite max acid rx, no ex cp since episode of chest burning at rest s diaphoresis or nausea. Has not acitivated meds from action plan part of of the med calendar. Not really coughing much >>d/c cozaar, rx diovan and bystolic  Bnp, troponin and cxr neg for acute process   11/12/2013 Follow up  Returns for 2 week follow up .  Reports breathing is improved approx 90% since last ov.  Doing well on the Bystolic and Diovan Last ov labs were unrevealing w/ neg bnp, troponin and cxr .  She was changed off cozaar to diovan and bystolic Today in office Sats were 89% on RA w/ walking w/ quick rebound to 91% at rest.  No flare in cough or congestion  ONO done is pending.    Current Medications, Allergies, Past Medical History, Past Surgical History, Family History, and Social History were reviewed in [facility] electronic medical record.  ROS  The following are not active  complaints unless bolded sore throat, dysphagia, dental problems, itching, sneezing,   purulent secretions, ear ache,   fever, chills, sweats, unintended wt loss, pleuritic or exertional cp, hemoptysis,  orthopnea pnd or leg swelling, presyncope, palpitations, heartburn, abdominal pain, anorexia, nausea, vomiting, diarrhea  or change in bowel or urinary habits, change in stools or urine, dysuria,hematuria,  rash, arthralgias, visual complaints, headache, numbness weakness or ataxia or problems with walking or coordination,  change in mood/affect or memory.                 Past Medical History:  Bronchiectasis  - CT chest and sinus September 08, 2009 >> mild diffuse bronchiectasis with mild sphenoid sinusitis  - HFA poor September 05, 2009 > 75 % with coaching January 30, 2010  Hypertension  GERD..................................................................................Marland Kitchen.Edwards  - Pos EGD 05/13/08  Complex med regimen  ---Meds reviewed with pt education and computerized med calendar completed/adjusted October 17, 2009 , 02/18/2011 , 11/16/2012         Objective:   Physical Exam  Amb wf nad  / nad   wt 1 132 October 04, 2008 >    126 January 30, 2010 > 02/12/2012 124 > 05/27/2012  126 > 122 08/26/2012 > 10/15/2012  119>118 11/16/2012 > 118 03/18/2013 > 114 10/29/2013 >113 11/12/2013   HEENT mild turbinate edema, non-tender sinus, clear discharge. Oropharynx no thrush  or excess pnd or cobblestoning. No JVD or cervical adenopathy. Mild accessory muscle hypertrophy. Trachea midline, nl thryroid. Chest was hyperinflated by percussion with diminished breath sounds in bases ,no wheezing. Regular rate and rhythm without murmur gallop or rub or increase P2. No edema Abd: no hsm, nl excursion. Ext warm without cyanosis or clubbing.        cxr 3/281/5  No acute cardiopulmonary disease. Basilar interstitial prominence consistent with interstitial fibrosis noted. Chest is stable from prior exam.   Lab Results   Component Value Date   PROBNP 81.0 10/29/2013     Recent Labs Lab 10/29/13 1713  NA 141  K 3.8  CL 100  CO2 35*  BUN 10  CREATININE 0.7  GLUCOSE 86    Recent Labs Lab 10/29/13 1713  HGB 15.9*  HCT 48.0*  WBC 11.4*  PLT 233.0    10/29/13 ekg p walking one lap with desats but no cp >  No change from prev PRWP and no acute ischemic change Troponin I neg   Assessment & Plan:

## 2013-11-12 NOTE — Assessment & Plan Note (Signed)
Recent flare now improved on current regimen   Plan  Cont on current regimen

## 2013-11-12 NOTE — Addendum Note (Signed)
Addended by: Boone MasterJONES, JESSICA E on: 11/12/2013 05:03 PM   Modules accepted: Orders

## 2013-11-12 NOTE — Assessment & Plan Note (Signed)
Improved - decreased desats with ambulation  Find ONO results.  No ambulatory O2 for now

## 2013-11-15 ENCOUNTER — Encounter: Payer: Self-pay | Admitting: Internal Medicine

## 2013-11-15 ENCOUNTER — Telehealth: Payer: Self-pay | Admitting: Internal Medicine

## 2013-11-15 DIAGNOSIS — J9610 Chronic respiratory failure, unspecified whether with hypoxia or hypercapnia: Secondary | ICD-10-CM

## 2013-11-15 NOTE — Telephone Encounter (Signed)
Per MW ONO pos- needs to start noct o2 2lpm and repeat ONO on 2lpm  Spoke with pt and notified of results per Dr. Sherene SiresWert. Pt verbalized understanding and denied any questions. [facility]

## 2013-11-19 ENCOUNTER — Encounter: Payer: Self-pay | Admitting: Internal Medicine

## 2013-11-19 ENCOUNTER — Telehealth: Payer: Self-pay | Admitting: Internal Medicine

## 2013-11-19 NOTE — Telephone Encounter (Signed)
Per MW-ONO 2lpm was normal  Pt should continue 2lpm with sleep  LMTCB to inform the pt

## 2013-11-19 NOTE — Telephone Encounter (Signed)
Spoke with pt and notified of results per Dr. Wert. Pt verbalized understanding and denied any questions. 

## 2013-11-23 ENCOUNTER — Other Ambulatory Visit: Payer: Self-pay | Admitting: Internal Medicine

## 2013-12-05 ENCOUNTER — Other Ambulatory Visit: Payer: Self-pay | Admitting: Internal Medicine

## 2013-12-24 ENCOUNTER — Encounter: Payer: Self-pay | Admitting: Internal Medicine

## 2013-12-24 ENCOUNTER — Ambulatory Visit (INDEPENDENT_AMBULATORY_CARE_PROVIDER_SITE_OTHER): Payer: Medicare Other | Admitting: Internal Medicine

## 2013-12-24 VITALS — BP 132/64 | HR 64 | Ht 61.0 in | Wt 116.0 lb

## 2013-12-24 DIAGNOSIS — J9610 Chronic respiratory failure, unspecified whether with hypoxia or hypercapnia: Secondary | ICD-10-CM

## 2013-12-24 DIAGNOSIS — I10 Essential (primary) hypertension: Secondary | ICD-10-CM

## 2013-12-24 DIAGNOSIS — J479 Bronchiectasis, uncomplicated: Secondary | ICD-10-CM

## 2013-12-24 DIAGNOSIS — Z23 Encounter for immunization: Secondary | ICD-10-CM

## 2013-12-24 MED ORDER — AZITHROMYCIN 250 MG PO TABS: ORAL_TABLET | ORAL | Status: DC

## 2013-12-24 NOTE — Progress Notes (Signed)
Subjective:    Patient ID: Tonya [name], female    DOB: [birth year]   MRN: [id number]  Brief patient profile:  [age] yowf never smoker with documented bronchiectasis with chronic cough with all of her symptoms improved on Symbicort 80/4.5 two puffs b.i.d. despite evidence of significant airflow obstruction by PFTs.(prev failed qvar).     HPI     03/18/2013 f/u ov/Nakul Avino re bronchiectassis/ "copd" Chief Complaint  Patient presents with  . Follow-up    Pt states overall doing well, she c/o PND and runny nose for the past month.   has not tried zyrtec  No nasty mucus No sob or need for saba on symbicort 160 2bid rec Prn zyrtec   10/29/2013 acute ov/Silveria Botz re: sob/ chest burning  Chief Complaint  Patient presents with  . Acute Visit    Pt c/o increased DOE for the past wk. She states that she gets SOB walking from room to room. She states she had "burning feeling" in chest and arms last night that lasted approx 10 min.   indolent onset gradually progressive doe assoc with More hoarse x one week assoc with ? HB despite max acid rx, no ex cp since episode of chest burning at rest s diaphoresis or nausea. Has not acitivated meds from action plan part of of the med calendar. Not really coughing much >>d/c cozaar, rx diovan and bystolic  Bnp, troponin and cxr neg for acute process   11/12/2013 Follow up  Returns for 2 week follow up .  Reports breathing is improved approx 90% since last ov.  Doing well on the Bystolic and Diovan Last ov labs were unrevealing w/ neg bnp, troponin and cxr .  She was changed off cozaar to diovan and bystolic Today in office Sats were 89% on RA w/ walking w/ quick rebound to 91% at rest.  No flare in cough or congestion   rec Continue on current regimen        12/24/2013 f/u ov/Kellan Raffield re:  Chief Complaint  Patient presents with  . Follow-up    c/o SOB with exertion, prod cough with green to clear mucous.  Increased dizziness when bending down after  switching BP meds.   overall much better in terms of resp symptoms but light headed if gets up too quickly sometimes since change to valsartan. Not limited by breathing from desired activities  / rare need for saba   No obvious day to day or daytime variabilty or assoc  or chest tightness, subjective wheeze overt sinus or hb symptoms. No unusual exp hx or h/o childhood pna/ asthma or knowledge of premature birth.  Sleeping ok without nocturnal  or early am exacerbation  of respiratory  c/o's or need for noct saba. Also denies any obvious fluctuation of symptoms with weather or environmental changes or other aggravating or alleviating factors except as outlined above   Current Medications, Allergies, Complete Past Medical History, Past Surgical History, Family History, and Social History were reviewed in [facility] electronic medical record.  ROS  The following are not active complaints unless bolded sore throat, dysphagia, dental problems, itching, sneezing,  nasal congestion or excess/ purulent secretions, ear ache,   fever, chills, sweats, unintended wt loss, pleuritic or exertional cp, hemoptysis,  orthopnea pnd or leg swelling, presyncope, palpitations, heartburn, abdominal pain, anorexia, nausea, vomiting, diarrhea  or change in bowel or urinary habits, change in stools or urine, dysuria,hematuria,  rash, arthralgias, visual complaints, headache, numbness weakness or ataxia or problems with  walking or coordination,  change in mood/affect or memory.           .                 Past Medical History:  Bronchiectasis  - CT chest and sinus September 08, 2009 >> mild diffuse bronchiectasis with mild sphenoid sinusitis  - HFA poor September 05, 2009 > 75 % with coaching January 30, 2010  Hypertension  GERD..................................................................................Marland KitchenEdwards  - Pos EGD 05/13/08  Complex med regimen  ---Meds reviewed with pt education and computerized  med calendar completed/adjusted October 17, 2009 , 02/18/2011 , 11/16/2012         Objective:   Physical Exam  Amb wf nad  / nad   wt 1 132 October 04, 2008 >    126 January 30, 2010 > 02/12/2012 124 > 05/27/2012  126 > 122 08/26/2012 > 10/15/2012  119>118 11/16/2012 > 118 03/18/2013 > 114 10/29/2013 >113 11/12/2013 > 116 12/24/2013   HEENT mild turbinate edema, non-tender sinus, clear discharge. Oropharynx no thrush or excess pnd or cobblestoning. No JVD or cervical adenopathy. Mild accessory muscle hypertrophy. Trachea midline, nl thryroid. Chest was hyperinflated by percussion with diminished breath sounds in bases ,no wheezing. Regular rate and rhythm without murmur gallop or rub or increase P2. No edema Abd: no hsm, nl excursion. Ext warm without cyanosis or clubbing.        cxr 3/281/5  No acute cardiopulmonary disease. Basilar interstitial prominence consistent with interstitial fibrosis noted. Chest is stable from prior exam.   Lab Results  Component Value Date   PROBNP 81.0 10/29/2013     Recent Labs Lab 10/29/13 1713  NA 141  K 3.8  CL 100  CO2 35*  BUN 10  CREATININE 0.7  GLUCOSE 86    Recent Labs Lab 10/29/13 1713  HGB 15.9*  HCT 48.0*  WBC 11.4*  PLT 233.0       Assessment & Plan:

## 2013-12-24 NOTE — Patient Instructions (Addendum)
For nasty mucus > Zpak as needed   Ok to change back to losartan to see if you dizziness gets better and if so continue it unless the cough/ breathing get worse but let Dr Tenny Craw make the final call on what's the best option for your blood pressure (there are many many alternatives that should work)  Facilities manager today   See calendar for specific medication instructions and bring it back for each and every office visit for every healthcare provider you see.  Without it,  you may not receive the best quality medical care that we feel you deserve.  You will note that the calendar groups together  your maintenance  medications that are timed at particular times of the day.  Think of this as your checklist for what your doctor has instructed you to do until your next evaluation to see what benefit  there is  to staying on a consistent group of medications intended to keep you well.  The other group at the bottom is entirely up to you to use as you see fit  for specific symptoms that may arise between visits that require you to treat them on an as needed basis.  Think of this as your action plan or "what if" list.   Separating the top medications from the bottom group is fundamental to providing you adequate care going forward.

## 2013-12-25 NOTE — Assessment & Plan Note (Signed)
-   CT chest and sinus September 08, 2009 >> mild diffuse bronchiectasis with mild sphenoid sinusitis  -  HFA 90% 05/20/2011    - PFT's 05/27/2012 FEV1  0.67 (37%)  Ratio 36 and no better p B2 DLCO 71  - Spiriva started 05/27/2012  > d/c 10/15/2012   - Alpha One AT 05/27/12 > 135  Phenotype: MM   -med calendar 11/16/2012   Adequate control on present rx, reviewed > no change in rx needed      Each maintenance medication was reviewed in detail including most importantly the difference between maintenance and as needed and under what circumstances the prns are to be used. This was done in the context of a medication calendar review which provided the patient with a user-friendly unambiguous mechanism for medication administration and reconciliation and provides an action plan for all active problems. It is critical that this be shown to every doctor  for modification during the office visit if necessary so the patient can use it as a working document.

## 2013-12-25 NOTE — Assessment & Plan Note (Signed)
-   10/29/13    Walked RA x one lap @ 185 stopped due to  desat to 82% with HCO3 35 c/w hypercarbia - ono RA 11/03/13 > 02 sat < 89% x 7h 54 m >  11/15/2013 rec 2lpm and repeat ono on 2lpm > no desats 11/16/13   rx  2lpm hs and prn daytime > Adequate control on present rx, reviewed > no change in rx needed

## 2013-12-25 NOTE — Assessment & Plan Note (Addendum)
cozar changed to diovan 10/30/2013  Added bystolic 5 mg daily 10/30/2013   bp perfect on present rx but having mild positional light headedeness  Ok to try back on losartan as long as upper airway symptoms don't flare   See instructions for specific recommendations which were reviewed directly with the patient who was given a copy with highlighter outlining the key components.

## 2014-03-10 ENCOUNTER — Encounter: Payer: Self-pay | Admitting: *Deleted

## 2014-03-24 ENCOUNTER — Ambulatory Visit (INDEPENDENT_AMBULATORY_CARE_PROVIDER_SITE_OTHER): Payer: Medicare Other | Admitting: Internal Medicine

## 2014-03-24 ENCOUNTER — Encounter: Payer: Self-pay | Admitting: Internal Medicine

## 2014-03-24 ENCOUNTER — Telehealth: Payer: Self-pay | Admitting: Internal Medicine

## 2014-03-24 VITALS — BP 138/60 | HR 68 | Ht 62.25 in | Wt 117.8 lb

## 2014-03-24 DIAGNOSIS — J479 Bronchiectasis, uncomplicated: Secondary | ICD-10-CM

## 2014-03-24 DIAGNOSIS — I10 Essential (primary) hypertension: Secondary | ICD-10-CM

## 2014-03-24 DIAGNOSIS — K219 Gastro-esophageal reflux disease without esophagitis: Secondary | ICD-10-CM

## 2014-03-24 NOTE — Patient Instructions (Addendum)
Try off pantoprazole and take an extra pepcid 20 mg after bfast to see if makes a difference with your urge to have bm  Try using xopenex before activity where you know you will be short of breath   Please see patient coordinator before you leave today  to schedule pulmonary rehab  Please schedule a follow up visit in 3 months but call sooner if needed with Tammy NP and all meds for new calendar

## 2014-03-24 NOTE — Assessment & Plan Note (Addendum)
Diarrhea/ fecal urgency ? Anxiety or related to ppi > try off and just use the h2 bid> f/u with Dr Randa EvensEdwards if not better     Each maintenance medication was reviewed in detail including most importantly the difference between maintenance and as needed and under what circumstances the prns are to be used. This was done in the context of a medication calendar review which provided the patient with a user-friendly unambiguous mechanism for medication administration and reconciliation and provides an action plan for all active problems. It is critical that this be shown to every doctor  for modification during the office visit if necessary so the patient can use it as a working document.

## 2014-03-24 NOTE — Progress Notes (Signed)
Subjective:    Patient ID: Tonya [name], female    DOB: [birth year]   MRN: [id number]  Brief patient profile:  [age] yowf never smoker with documented bronchiectasis with chronic cough with all of her symptoms improved on Symbicort 80/4.5 two puffs b.i.d. despite evidence of significant airflow obstruction by PFTs.(prev failed qvar).     HPI     03/18/2013 f/u ov/Wert re bronchiectassis/ "copd" Chief Complaint  Patient presents with  . Follow-up    Pt states overall doing well, she c/o PND and runny nose for the past month.   has not tried zyrtec  No nasty mucus No sob or need for saba on symbicort 160 2bid rec Prn zyrtec   10/29/2013 acute ov/Wert re: sob/ chest burning  Chief Complaint  Patient presents with  . Acute Visit    Pt c/o increased DOE for the past wk. She states that she gets SOB walking from room to room. She states she had "burning feeling" in chest and arms last night that lasted approx 10 min.   indolent onset gradually progressive doe assoc with More hoarse x one week assoc with ? HB despite max acid rx, no ex cp since episode of chest burning at rest s diaphoresis or nausea. Has not acitivated meds from action plan part of of the med calendar. Not really coughing much >>d/c cozaar, rx diovan and bystolic  Bnp, troponin and cxr neg for acute process   11/12/2013 Follow up  Returns for 2 week follow up .  Reports breathing is improved approx 90% since last ov.  Doing well on the Bystolic and Diovan Last ov labs were unrevealing w/ neg bnp, troponin and cxr .  She was changed off cozaar to diovan and bystolic Today in office Sats were 89% on RA w/ walking w/ quick rebound to 91% at rest.  No flare in cough or congestion   rec Continue on current regimen        12/24/2013 f/u ov/Wert re:  Chief Complaint  Patient presents with  . Follow-up    c/o SOB with exertion, prod cough with green to clear mucous.  Increased dizziness when bending down after  switching BP meds.   overall much better in terms of resp symptoms but light headed if gets up too quickly sometimes since change to valsartan. Not limited by breathing from desired activities  / rare need for saba  rec For nasty mucus > Zpak as needed  Ok to change back to losartan to see if you dizziness gets better and if so continue it unless the cough/ breathing get worse but let Dr Tenny Crawoss make the final call on what's the best option for your blood pressure (there are many many alternatives that should work) Facilities managerrevnar given     03/24/2014 f/u ov/Wert re: bronchiectasis with  ? Component uacs  Chief Complaint  Patient presents with  . Follow-up    Pt states that cough has been increased--using Mucinex for increased mucus. Pt states that anxiety levels have worsened some and she notices this contributes to her breathing.     freq urge to have bm ? From PPI? No increase in purulent sputum but worse cough when flutter valve broke and ordering a new one on line  Only sob when over does it like long walks or lifting   No obvious day to day or daytime variabilty or assoc  or chest tightness, subjective wheeze overt sinus or hb symptoms. No unusual exp hx or  h/o childhood pna/ asthma or knowledge of premature birth.  Sleeping ok without nocturnal  or early am exacerbation  of respiratory  c/o's or need for noct saba. Also denies any obvious fluctuation of symptoms with weather or environmental changes or other aggravating or alleviating factors except as outlined above   Current Medications, Allergies, Complete Past Medical History, Past Surgical History, Family History, and Social History were reviewed in [employer] record.  ROS  The following are not active complaints unless bolded sore throat, dysphagia, dental problems, itching, sneezing,  nasal congestion or excess/ purulent secretions, ear ache,   fever, chills, sweats, unintended wt loss, pleuritic or exertional cp,  hemoptysis,  orthopnea pnd or leg swelling, presyncope, palpitations, heartburn, abdominal pain, anorexia, nausea, vomiting, diarrhea  or change in bowel or urinary habits, change in stools or urine, dysuria,hematuria,  rash, arthralgias, visual complaints, headache, numbness weakness or ataxia or problems with walking or coordination,  change in mood/affect or memory.           .                 Past Medical History:  Bronchiectasis  - CT chest and sinus September 08, 2009 >> mild diffuse bronchiectasis with mild sphenoid sinusitis  - HFA poor September 05, 2009 > 75 % with coaching January 30, 2010  Hypertension  GERD..................................................................................Marland KitchenEdwards  - Pos EGD 05/13/08  Complex med regimen  ---Meds reviewed with pt education and computerized med calendar completed/adjusted October 17, 2009 , 02/18/2011 , 11/16/2012         Objective:   Physical Exam  Amb wf nad  / nad   wt 1 132 October 04, 2008 >    126 January 30, 2010 > 02/12/2012 124 > 05/27/2012  126 > 122 08/26/2012 > 10/15/2012  119>118 11/16/2012 > 118 03/18/2013 > 114 10/29/2013 >113 11/12/2013 > 116 12/24/2013 > 03/24/2014  118   HEENT mild turbinate edema, non-tender sinus, clear discharge. Oropharynx no thrush or excess pnd or cobblestoning. No JVD or cervical adenopathy. Mild accessory muscle hypertrophy. Trachea midline, nl thryroid. Chest was hyperinflated by percussion with diminished breath sounds in bases ,no wheezing. Regular rate and rhythm without murmur gallop or rub or increase P2. No edema Abd: no hsm, nl excursion. Ext warm without cyanosis or clubbing.        cxr 3/281/5  No acute cardiopulmonary disease. Basilar interstitial prominence consistent with interstitial fibrosis noted. Chest is stable from prior exam.   Lab Results  Component Value Date   PROBNP 81.0 10/29/2013     Recent Labs Lab 10/29/13 1713  NA 141  K 3.8  CL 100  CO2 35*  BUN 10   CREATININE 0.7  GLUCOSE 86    Recent Labs Lab 10/29/13 1713  HGB 15.9*  HCT 48.0*  WBC 11.4*  PLT 233.0       Assessment & Plan:

## 2014-03-24 NOTE — Assessment & Plan Note (Addendum)
cozar changed to diovan 10/30/2013  Added bystolic 5 mg daily 10/30/2013  Changed back to losarten 12/25/13 > sensation of globus some worse but bp good control and no longer orthostatic   Consider trial back on valsartan if globus sensation persists as For reasons that may related to vascular permability and nitric oxide pathways but not elevated  bradykinin levels (as seen with  ACEi use) losartan in the generic form has been reported now from mulitple sources  to cause a similar pattern of non-specific  upper airway symptoms as seen with acei.   This has not been reported with exposure to the other ARB's to date, so it seems reasonable for now to try either generic diovan or avapro if ARB needed or use an alternative class altogether.  See:  Dewayne HatchAnn Allergy Asthma Immunol  2008: 101: p [phone]

## 2014-03-24 NOTE — Assessment & Plan Note (Signed)
-   CT chest and sinus September 08, 2009 >> mild diffuse bronchiectasis with mild sphenoid sinusitis  -  HFA 90% 05/20/2011    - PFT's 05/27/2012 FEV1  0.67 (37%)  Ratio 36 and no better p B2 DLCO 71  - Spiriva started 05/27/2012  > d/c 10/15/2012   - Alpha One AT 05/27/12 > 135  Phenotype: MM   -med calendar 11/16/2012     Each maintenance medication was reviewed in detail including most importantly the difference between maintenance and as needed and under what circumstances the prns are to be used. This was done in the context of a medication calendar review which provided the patient with a user-friendly unambiguous mechanism for medication administration and reconciliation and provides an action plan for all active problems. It is critical that this be shown to every doctor  for modification during the office visit if necessary so the patient can use it as a working document.      Referral to rehab next step

## 2014-03-24 NOTE — Telephone Encounter (Signed)
Called and spoke with pt and she stated that she is having a hard time getting the green flutter device.  She stated that where she took this they will not sell to her.  She stated that MW told her we have these in our office.  MW do you want the pt to come here to get this or go through DME.  Please advise. Thanks  Allergies  Allergen Reactions  . Alprazolam     REACTION: "I just feel funny"  . Doxycycline     REACTION: rash, redness in face, slight swelling in lips  . Levaquin [Levofloxacin]     REACTION: redness in face, rash, slight swelling in lips  . Metoclopramide Hcl   . Moxifloxacin   . Penicillins   . Sulfonamide Derivatives   . Tramadol     REACTION: made pt "feel funny"    Current Outpatient Prescriptions on File Prior to Visit  Medication Sig Dispense Refill  . aspirin 81 MG tablet Take 81 mg by mouth daily.        . Calcium Carbonate (CALTRATE 600) 1500 MG TABS Take 1 tablet by mouth daily.        . cetirizine (ZYRTEC) 10 MG tablet Take 1 tablet (10 mg total) by mouth daily as needed (for nasal drainage, throat tickle).      Marland Kitchen. dextromethorphan-guaiFENesin (MUCINEX DM) 30-600 MG per 12 hr tablet Take 1-2 tablets by mouth 2 (two) times daily as needed (for thick mucus, congestion, cough). For cough and congestion      . famotidine (PEPCID) 20 MG tablet Take 20 mg by mouth at bedtime.      . fluticasone (FLONASE) 50 MCG/ACT nasal spray USE 1 TO 2 SPRAYS IN EACH NOSTRIL IN THE MORNING AND AT BEDTIME  16 g  11  . HYDROcodone-acetaminophen (VICODIN) 5-500 MG per tablet Take 1 tablet by mouth every 4 (four) hours as needed for pain.  40 tablet  0  . levalbuterol (XOPENEX HFA) 45 MCG/ACT inhaler Inhale 1-2 puffs into the lungs every 4 (four) hours as needed for wheezing.  1 Inhaler  12  . losartan-hydrochlorothiazide (HYZAAR) 100-12.5 MG per tablet Take 1 tablet by mouth daily.      . Multiple Vitamin (MULTIVITAMIN) tablet Take 1 tablet by mouth daily.        . Nasal Saline 0.65 %  SOLN Use several times a day as needed for nasal congestion      . oxymetazoline (AFRIN) 0.05 % nasal spray 2 puffs twice daily for five days and as needed for nasal congestion      . pantoprazole (PROTONIX) 40 MG tablet Take 30- 60 min before your first and last meals of the day  30 tablet  2  . Respiratory Therapy Supplies (FLUTTER) DEVI Use several times a day as needed for thick mucus, congestion, cough      . sertraline (ZOLOFT) 25 MG tablet 1/2 tab by mouth every morning      . SYMBICORT 160-4.5 MCG/ACT inhaler USE 2 PUFFS TWICE DAILY  1 Inhaler  11   No current facility-administered medications on file prior to visit.

## 2014-03-24 NOTE — Telephone Encounter (Signed)
Whatever works best for her

## 2014-03-25 ENCOUNTER — Encounter: Payer: Self-pay | Admitting: Internal Medicine

## 2014-03-25 NOTE — Telephone Encounter (Signed)
Pt will pick up from our office.

## 2014-03-31 ENCOUNTER — Other Ambulatory Visit: Payer: Self-pay | Admitting: Internal Medicine

## 2014-04-04 ENCOUNTER — Telehealth: Payer: Self-pay | Admitting: Internal Medicine

## 2014-04-04 MED ORDER — HYDROCODONE-ACETAMINOPHEN 7.5-325 MG PO TABS: 1.0000 | ORAL_TABLET | ORAL | Status: DC | PRN

## 2014-04-04 NOTE — Telephone Encounter (Signed)
Pt states that Benzonatate  Rx is too expensive--cannot afford this. Requests an alternative. Pt also requesting refill of Hydrocodone 5/500 - Take 1 Every 4 hours PRN #[id number]  [facility] [address]  Please advise Dr Sherene Sires. Thanks.

## 2014-04-04 NOTE — Telephone Encounter (Signed)
I spoke with the pt and notified of recs per MW  She verbalized understanding  Rx printed and up front for pick up

## 2014-04-04 NOTE — Telephone Encounter (Signed)
mucinex dm is the best non narcoticcough med max dose is 1200 mg every 12 h plus fltter and can be supplemented with the hydrocodone ok to refill same amt  tessilon really not very effective anyway and has no alternative

## 2014-04-12 ENCOUNTER — Telehealth: Payer: Self-pay | Admitting: Internal Medicine

## 2014-04-12 NOTE — Telephone Encounter (Signed)
Called pt. appt scheduled for tomorrow to see MW at 3:45. Pt aware to bring all meds/ med calendar. Nothing further needed

## 2014-04-12 NOTE — Telephone Encounter (Signed)
Called and spoke with pt and she stated that she was seen by MW on 03/24/14.  She stated that on 8/25 she called and was given zpak and told to take mucinex dm and this didn't help with the cough.  She called back on 04/04/14 and was told to take the mucinex dm 1200 mg bid and use the flutter and hydrocodone that was called in.  She had allergy to the hydrocodone and she did not pick this up.  She stated that she had to pay out of pocket for the benzonatate and she only got 15 tablets.  She stated that this has helped some, she is still having a productive cough with light yellow/clear sputum.  She does feel terrible and the cough will not go away.  MW please advise. Thanks  Allergies  Allergen Reactions  . Alprazolam     REACTION: "I just feel funny"  . Doxycycline     REACTION: rash, redness in face, slight swelling in lips  . Levaquin [Levofloxacin]     REACTION: redness in face, rash, slight swelling in lips  . Metoclopramide Hcl   . Moxifloxacin   . Penicillins   . Sulfonamide Derivatives   . Tramadol     REACTION: made pt "feel funny"    Current Outpatient Prescriptions on File Prior to Visit  Medication Sig Dispense Refill  . aspirin 81 MG tablet Take 81 mg by mouth daily.        . benzonatate (TESSALON) 200 MG capsule TAKE 1 CAPSULE (200 MG TOTAL) BY MOUTH 3 (THREE) TIMES DAILY AS NEEDED FOR COUGH.  90 capsule  0  . Calcium Carbonate (CALTRATE 600) 1500 MG TABS Take 1 tablet by mouth daily.        . cetirizine (ZYRTEC) 10 MG tablet Take 1 tablet (10 mg total) by mouth daily as needed (for nasal drainage, throat tickle).      Marland Kitchen dextromethorphan-guaiFENesin (MUCINEX DM) 30-600 MG per 12 hr tablet Take 1-2 tablets by mouth 2 (two) times daily as needed (for thick mucus, congestion, cough). For cough and congestion      . famotidine (PEPCID) 20 MG tablet Take 20 mg by mouth at bedtime.      . fluticasone (FLONASE) 50 MCG/ACT nasal spray USE 1 TO 2 SPRAYS IN EACH NOSTRIL IN THE MORNING AND  AT BEDTIME  16 g  11  . HYDROcodone-acetaminophen (NORCO) 7.5-325 MG per tablet Take 1 tablet by mouth every 4 (four) hours as needed.  40 tablet  0  . HYDROcodone-acetaminophen (VICODIN) 5-500 MG per tablet Take 1 tablet by mouth every 4 (four) hours as needed for pain.  40 tablet  0  . levalbuterol (XOPENEX HFA) 45 MCG/ACT inhaler Inhale 1-2 puffs into the lungs every 4 (four) hours as needed for wheezing.  1 Inhaler  12  . losartan-hydrochlorothiazide (HYZAAR) 100-12.5 MG per tablet Take 1 tablet by mouth daily.      . Multiple Vitamin (MULTIVITAMIN) tablet Take 1 tablet by mouth daily.        . Nasal Saline 0.65 % SOLN Use several times a day as needed for nasal congestion      . oxymetazoline (AFRIN) 0.05 % nasal spray 2 puffs twice daily for five days and as needed for nasal congestion      . pantoprazole (PROTONIX) 40 MG tablet Take 30- 60 min before your first and last meals of the day  30 tablet  2  . Respiratory Therapy Supplies (FLUTTER) DEVI  Use several times a day as needed for thick mucus, congestion, cough      . sertraline (ZOLOFT) 25 MG tablet 1/2 tab by mouth every morning      . SYMBICORT 160-4.5 MCG/ACT inhaler USE 2 PUFFS TWICE DAILY  1 Inhaler  11   No current facility-administered medications on file prior to visit.

## 2014-04-12 NOTE — Telephone Encounter (Signed)
Can't call in anything stronger than tessilon but ok to call in more if she desires  Needs ov to regroup with all meds/ prefer she see tammy for this but if can't work it out see me this week, bring all meds and med calendar

## 2014-04-13 ENCOUNTER — Encounter: Payer: Self-pay | Admitting: Internal Medicine

## 2014-04-13 ENCOUNTER — Ambulatory Visit (INDEPENDENT_AMBULATORY_CARE_PROVIDER_SITE_OTHER): Payer: Medicare Other | Admitting: Internal Medicine

## 2014-04-13 VITALS — BP 162/80 | HR 77 | Temp 98.1°F | Ht 61.75 in | Wt 113.0 lb

## 2014-04-13 DIAGNOSIS — R059 Cough, unspecified: Secondary | ICD-10-CM

## 2014-04-13 DIAGNOSIS — R058 Other specified cough: Secondary | ICD-10-CM

## 2014-04-13 DIAGNOSIS — J479 Bronchiectasis, uncomplicated: Secondary | ICD-10-CM

## 2014-04-13 DIAGNOSIS — J9610 Chronic respiratory failure, unspecified whether with hypoxia or hypercapnia: Secondary | ICD-10-CM

## 2014-04-13 DIAGNOSIS — R05 Cough: Secondary | ICD-10-CM

## 2014-04-13 DIAGNOSIS — J9612 Chronic respiratory failure with hypercapnia: Secondary | ICD-10-CM

## 2014-04-13 NOTE — Progress Notes (Signed)
Subjective:    Patient ID: Tonya [name], female    DOB: April 08, 1946   MRN: [id number]  Brief patient profile:  76 yowf never smoker with documented bronchiectasis with chronic cough with all of her symptoms improved on Symbicort 80/4.5 two puffs b.i.d.    History of Present Illness  03/18/2013 f/u ov/Wert re bronchiectassis/ "copd" Chief Complaint  Patient presents with  . Follow-up    Pt states overall doing well, she c/o PND and runny nose for the past month.   has not tried zyrtec  No nasty mucus No sob or need for saba on symbicort 160 2bid rec Prn zyrtec     12/24/2013 f/u ov/Wert re:  Chief Complaint  Patient presents with  . Follow-up    c/o SOB with exertion, prod cough with green to clear mucous.  Increased dizziness when bending down after switching BP meds.   overall much better in terms of resp symptoms but light headed if gets up too quickly sometimes since change to valsartan. Not limited by breathing from desired activities  / rare need for saba  rec For nasty mucus > Zpak as needed  Ok to change back to losartan to see if your dizziness gets better and if so continue it unless the cough/ breathing get worse but let Dr Tenny Craw make the final call on what's the best option for your blood pressure (there are many many alternatives that should work) Facilities manager given     03/24/2014 f/u ov/Wert re: bronchiectasis with  ? Component uacs  Chief Complaint  Patient presents with  . Follow-up    Pt states that cough has been increased--using Mucinex for increased mucus. Pt states that anxiety levels have worsened some and she notices this contributes to her breathing.   freq urge to have bm ? From PPI? No increase in purulent sputum but worse cough when flutter valve broke and ordering a new one on line  Only sob when over does it like long walks or lifting  rec Try off pantoprazole and take an extra pepcid 20 mg after bfast to see if makes a difference with your urge  to have bm Try using xopenex before activity where you know you will be short of breath   04/13/2014 f/u ov/Wert re: bronchiectasis/ refractory Chief Complaint  Patient presents with  . Acute Visit    Pt states cough started to worsen after last visit 03/24/14. She has taken zpack. Cough is prod with yellow sputum.   did not change am pantoprozole but the cough is worse in pm before taking pepcid at hs. Mucus still yellowish>  Less dark p zpak rx she uses prn  Not limited by breathing from desired activities    No obvious day to day or daytime variabilty or assoc  or chest tightness, subjective wheeze overt sinus or hb symptoms. No unusual exp hx or h/o childhood pna/ asthma or knowledge of premature birth.  Sleeping ok without nocturnal  or early am exacerbation  of respiratory  c/o's or need for noct saba. Also denies any obvious fluctuation of symptoms with weather or environmental changes or other aggravating or alleviating factors except as outlined above   Current Medications, Allergies, Complete Past Medical History, Past Surgical History, Family History, and Social History were reviewed in [employer] record.  ROS  The following are not active complaints unless bolded sore throat, dysphagia, dental problems, itching, sneezing,  nasal congestion or excess/ purulent secretions, ear ache,   fever,  chills, sweats, unintended wt loss, pleuritic or exertional cp, hemoptysis,  orthopnea pnd or leg swelling, presyncope, palpitations, heartburn, abdominal pain, anorexia, nausea, vomiting, diarrhea  or change in bowel or urinary habits, change in stools or urine, dysuria,hematuria,  rash, arthralgias, visual complaints, headache, numbness weakness or ataxia or problems with walking or coordination,  change in mood/affect or memory.          Past Medical History:  Bronchiectasis  - CT chest and sinus September 08, 2009 >> mild diffuse bronchiectasis with mild sphenoid  sinusitis  - HFA poor September 05, 2009 > 75 % with coaching January 30, 2010  Hypertension  GERD..................................................................................Marland KitchenEdwards  - Pos EGD 05/13/08  Complex med regimen  ---Meds reviewed with pt education and computerized med calendar completed/adjusted October 17, 2009 , 02/18/2011 , 11/16/2012         Objective:   Physical Exam  Amb wf nad  / nad   wt 1 132 October 04, 2008 >    126 January 30, 2010 > 02/12/2012 124 > 05/27/2012  126 > 122 08/26/2012 > 10/15/2012  119>118 11/16/2012 > 118 03/18/2013 > 114 10/29/2013 >113 11/12/2013 > 116 12/24/2013 > 03/24/2014  118 > 04/13/2014 113   HEENT mild turbinate edema, non-tender sinus, clear discharge. Oropharynx no thrush or excess pnd or cobblestoning. No JVD or cervical adenopathy. Mild accessory muscle hypertrophy. Trachea midline, nl thryroid. Chest was hyperinflated by percussion with diminished breath sounds in bases ,no wheezing. Regular rate and rhythm without murmur gallop or rub or increase P2. No edema Abd: no hsm, nl excursion. Ext warm without cyanosis or clubbing.        cxr 3/271/5  No acute cardiopulmonary disease. Basilar interstitial prominence consistent with interstitial fibrosis noted. Chest is stable from prior exam.   Lab Results  Component Value Date   PROBNP 81.0 10/29/2013     Recent Labs Lab 10/29/13 1713  NA 141  K 3.8  CL 100  CO2 35*  BUN 10  CREATININE 0.7  GLUCOSE 86    Recent Labs Lab 10/29/13 1713  HGB 15.9*  HCT 48.0*  WBC 11.4*  PLT 233.0       Assessment & Plan:

## 2014-04-13 NOTE — Patient Instructions (Signed)
Ok to continue the protonix Take 30-60 min before first meal of the day and take pepcid (famotidine) 20 mg at least an hour before bedtime to see if helps cough  Ok to use the flutter valve up to every hour as needed for cough   See calendar for specific medication instructions and bring it back for each and every office visit for every healthcare provider you see.  Without it,  you may not receive the best quality medical care that we feel you deserve.  You will note that the calendar groups together  your maintenance  medications that are timed at particular times of the day.  Think of this as your checklist for what your doctor has instructed you to do until your next evaluation to see what benefit  there is  to staying on a consistent group of medications intended to keep you well.  The other group at the bottom is entirely up to you to use as you see fit  for specific symptoms that may arise between visits that require you to treat them on an as needed basis.  Think of this as your action plan or "what if" list.   Separating the top medications from the bottom group is fundamental to providing you adequate care going forward.    Keep follow up appt with Tammy

## 2014-04-14 DIAGNOSIS — R058 Other specified cough: Secondary | ICD-10-CM | POA: Insufficient documentation

## 2014-04-14 DIAGNOSIS — R05 Cough: Secondary | ICD-10-CM | POA: Insufficient documentation

## 2014-04-14 NOTE — Assessment & Plan Note (Signed)
-   10/29/13    Walked RA x one lap @ 185 stopped due to  desat to 82% with HCO3 35 c/w hypercarbia - ono RA 11/03/13 > 02 sat < 89% x 7h 54 m >  11/15/2013 rec 2lpm and repeat ono on 2lpm > no desats 11/16/13   rec no change rx

## 2014-04-14 NOTE — Assessment & Plan Note (Addendum)
-   CT chest and sinus September 08, 2009 >> mild diffuse bronchiectasis with mild sphenoid sinusitis  -  HFA 90% 05/20/2011    - PFT's 05/27/2012 FEV1  0.67 (37%)  Ratio 36 and no better p B2 DLCO 71  - Spiriva started 05/27/2012  > d/c 10/15/2012   - Alpha One AT 05/27/12 > 135  Phenotype: MM   -med calendar 11/16/2012  - referred to rehab 03/24/14   Cough in pm is probably not bronchiectasis but related to uacs but reinforced use of flutter valve as much as possible in either case     Each maintenance medication was reviewed in detail including most importantly the difference between maintenance and as needed and under what circumstances the prns are to be used. This was done in the context of a medication calendar review which provided the patient with a user-friendly unambiguous mechanism for medication administration and reconciliation and provides an action plan for all active problems. It is critical that this be shown to every doctor  for modification during the office visit if necessary so the patient can use it as a working document.

## 2014-04-14 NOTE — Assessment & Plan Note (Signed)
Worse cough in pm p d/c ppi suggestive of acid reflux > rec change timing on pm pepcid to see if helps and if not rechallenge with second dose of ppi before supper with option of change ppi to aciphex best tol in my experience from a gi perspective

## 2014-04-22 ENCOUNTER — Telehealth (HOSPITAL_COMMUNITY): Payer: Self-pay

## 2014-04-22 NOTE — Telephone Encounter (Signed)
Called patient to discuss entrance into the Pulmonary Rehab program.  Patient is interested in the program but needs to look at her husbands schedule since he has parkinson's disease and she is his caretaker.  Patient was instructed to call her insurance company in the meantime to verify coverage for the program.  Will follow up with patient.

## 2014-05-02 ENCOUNTER — Encounter (HOSPITAL_COMMUNITY): Payer: Self-pay

## 2014-05-02 ENCOUNTER — Encounter (HOSPITAL_COMMUNITY)
Admission: RE | Admit: 2014-05-02 | Discharge: 2014-05-02 | Disposition: A | Discharge status: Home / Self Care | Payer: Medicare Other | Source: Ambulatory Visit | Attending: Internal Medicine | Admitting: Internal Medicine

## 2014-05-02 VITALS — BP 128/57 | HR 69

## 2014-05-02 DIAGNOSIS — J449 Chronic obstructive pulmonary disease, unspecified: Secondary | ICD-10-CM

## 2014-05-02 NOTE — Progress Notes (Addendum)
Tonya [name] 68 y.o. female Pulmonary Rehab Orientation Note Patient arrived today in Cardiac and Pulmonary Rehab for orientation to Pulmonary Rehab. She was transported from [facility] [employer] [facility] via wheel chair. She does not carry portable oxygen. Per pt, she uses oxygen at night when sleeping. Color good, skin warm and dry. Patient is oriented to time and place. Patient's medical history and medications reviewed. Heart rate is normal, breath sounds clear to auscultation, no wheezes, rales, or rhonchi, diminished breath sounds. Grip strength equal, strong. Distal pulses 2+ bilateral pulses present. Patient reports she does take medications as prescribed. Patient states she follows a regular diet. The patient reports no specific efforts to gain or lose weight. She has steadily lost weight over the last few years without trying.  I explained that pulmonary patients use more calories just from breathing than the average person.  I explained the need for her to eat small frequent meals and to drink milkshakes or smoothies with protein or whey powder to increase her calorie intake.  I also explained that she needs more reserves for if and when she gets sick. Patient's weight will be monitored closely. Demonstration and practice of PLB using pulse oximeter. Patient able to return demonstration satisfactorily.  Patient practiced pursed lip breathing while we walked to the parking deck and she stated that it definitely makes a difference in her ability to get her breath.  Safety and hand hygiene in the exercise area reviewed with patient. Patient voices understanding of the information reviewed. Department expectations discussed with patient and achievable goals were set. The patient shows enthusiasm about attending the program and we look forward to working with this nice lady. The patient is scheduled for a 6 min walk test on Tuesday, May 03, 2014 @ 4pm and to begin exercise on Thursday, May 05, 2014 in the  1030 class.   Haskel Khan RN [phone]

## 2014-05-02 NOTE — Progress Notes (Signed)
Tonya [name] 68 y.o. female  Initial Psychosocial Assessment  Pt psychosocial assessment reveals pt lives with their spouse. He has Parkinson's disease and just recently went through inpatient rehab after falling and fracturing a hip.  This was a very stressful time for the patient and she developed depression and was prescribed Zoloft and she states that has helped her cope with taking care of her husband.  Her husband is back to being able to care for himself.   Pt is currently retired. Pt hobbies include going to church and participating in the activities they have.  Pt reports her stress level is moderate. Areas of stress/anxiety include patient's health and being the strong one in the family.  Pt does not exhibit signs of depression. She scored 0 on the PHQ scale.   Pt shows good  coping skills with positive outlook .  Offered emotional support and reassurance.  Her goals for the program are to be able to manage her breathing  by getting stronger and in turn she hopes this will help her breathe better.  She also wants to learn more about COPD.   Monitor and evaluate progress toward psychosocial goal(s).  Goal(s): Improved management of COPD and breathing techniques Improved knowledge of patient's pulmonary disease Help patient work toward returning to meaningful activities that improve patient's QOL and are attainable with patient's lung disease   05/02/2014 11:29 AM

## 2014-05-03 ENCOUNTER — Encounter (HOSPITAL_COMMUNITY)
Admission: RE | Admit: 2014-05-03 | Discharge: 2014-05-03 | Disposition: A | Discharge status: Home / Self Care | Payer: Medicare Other | Source: Ambulatory Visit | Attending: Internal Medicine | Admitting: Internal Medicine

## 2014-05-03 NOTE — Progress Notes (Addendum)
Tonya [name] completed a Six-Minute Walk Test on 05/03/14 . Tonya [name] walked 893 feet with 0 breaks.  The patient's lowest oxygen saturation was 89% , highest heart rate was 83 bpm, and highest blood pressure was 150/74. The patient was on 3 liters of oxygen with a nasal cannula. Tonya [name] stated that leg fatigue toward the end hindered her walk test.  The patient stated that she was only on oxygen at night.  Before the patient started the walk test she was 88% on room air.  I put her on 2 liters and it brought her oxygen saturation up to 91%.

## 2014-05-05 ENCOUNTER — Encounter (HOSPITAL_COMMUNITY): Payer: Self-pay

## 2014-05-05 ENCOUNTER — Encounter (HOSPITAL_COMMUNITY)
Admission: RE | Admit: 2014-05-05 | Discharge: 2014-05-05 | Disposition: A | Discharge status: Home / Self Care | Payer: Medicare Other | Source: Ambulatory Visit | Attending: Internal Medicine | Admitting: Internal Medicine

## 2014-05-05 DIAGNOSIS — J449 Chronic obstructive pulmonary disease, unspecified: Secondary | ICD-10-CM | POA: Diagnosis not present

## 2014-05-05 DIAGNOSIS — J479 Bronchiectasis, uncomplicated: Secondary | ICD-10-CM | POA: Diagnosis not present

## 2014-05-05 DIAGNOSIS — Z5189 Encounter for other specified aftercare: Secondary | ICD-10-CM | POA: Diagnosis present

## 2014-05-05 NOTE — Progress Notes (Signed)
Today, Dois DavenportSandra exercised at [facility]. Service time was from 11:15am to 1:30pm.  The patient exercised for more than 31 minutes performing aerobic, strengthening, and stretching exercises. Oxygen saturation, heart rate, blood pressure, rate of perceived exertion, and shortness of breath were all monitored before, during, and after exercise. Dois DavenportSandra presented with no problems at today's exercise session. The patient attended MD Lecture Day with Dr. Molli KnockYacoub.  There was no workload change during today's exercise session.  Pre-exercise vitals:   Weight kg: 51.4   Liters of O2: 3   SpO2: 94   HR: 71   BP: 134/64   CBG: na  Exercise vitals:   Highest heartrate:  92   Lowest oxygen saturation: 96   Highest blood pressure: 142/74   Liters of 02: 3  Post-exercise vitals:   SpO2: 97   HR: 85   BP: 98/70   Liters of O2: 3   CBG: na  Dr. Kalman ShanMurali Ramaswamy, Medical Director Dr. David StallFeliz-Ortiz is immediately available during today's Pulmonary Rehab session for Lovie MacadamiaSandra H Schmaltz on 05/05/14 at 10:30am class time.

## 2014-05-05 NOTE — Outcomes Assessment (Signed)
[facility] [place] Pulmonary Rehabilitation Baseline Outcomes Assessment   Anthropometrics:    Height (inches): 61.5   Weight (kg): 51.1  Functional Status/Exercise Capacity:   Tonya DavenportSandra had a resting heart rate of 69 BPM, a resting blood pressure of 128/57, and an oxygen saturation of 92 % on 0 liters of O2.  Tonya DavenportSandra performed a 6-minute walk test on 05/03/14.  The patient completed 893 feet in 6 minutes with 0 rest breaks.  This quantifies 2.30 METS.   Dyspnea Measures:   The [facility]mMRC is a simple and standardized method of classifying disability in patients with COPD.  The assessment correlates disability and dyspnea.  At entrance the patient scored a 2. The scale is provided below.   0= I only get breathless with strenuous exercise. 1= I get short of breath when hurrying on level ground or walking up a slight incline. 2= On level ground, I walk slower than people of the same age because of breathlessness, or have to stop for breath when walking at my own pace. 3= I stop for breath after walking 100 yards or after a few minutes on level ground. 4=I am too breathless to leave the house or I am breathless when dressing.     The patient completed the [facility] Shortness Of Breath Questionnaire (UCSD CentralSOBQ).  This questionnaire relates activities of daily living and shortness of breath.  The score ranges from 0-120, a higher score relates to severe shortness of breath during activities of daily living. The patient's score at entrance was 54.  Quality of Life:   Ferrans and Powers Quality of Life Index Pulmonary Version is used to assess the patients satisfaction in different domains of their life; health and functioning, socioeconomic, psychological/spiritual, and family. The overall score is recorded out of 30 points.  The patient's goal is to achieve an overall score of 21 or higher.  Tonya DavenportSandra received a 25.31 at entrance.    The Patient Health Questionnaire  (PHQ-2) is a first step approach for the screening of depression.  If the patient scores positive on the PHQ-2 the patient should be further assessed with the PHQ-9.  The Patient Health Questionnaire (PHQ-9) assesses the degree of depression.  Depression is important to monitor and track in pulmonary patients due to its prevalence in the population.  If the patient advances to the PHQ-9 the goal is to score less than 4 on this assessment.  Tonya DavenportSandra scored a 0 at entrance.  Clinical Assessment Tools:   The COPD Assessment Test (CAT) is a measurement tool to quantify how much of an impact the disease has on the patient's life.  This assessment aids the Pulmonary Rehab Team in designing the patients individualized treatment plan.  A CAT score ranges from 0-40.  A score of 10 or below indicates that COPD has a low impact on the patient's life whereas a score of 30 or higher indicates a severe impact. The patient's goal is a decrease of 1 point from entrance to discharge.  Tonya DavenportSandra had a CAT score of 25 at entrance.  Nutrition:   The "Rate My Plate" is a dietary assessment that quantifies the balance of a patient's diet.  This tool allows the Pulmonary Rehab Team to key in on the areas of the patient's diet that needs improving.  The team can then focus their nutritional education on those areas.  If the patient scores 24-40, this means there are many ways they can make their eating  habits healthier, 41-57 states that there are some ways they can make their eating habits healthier and a score of 58-72 states that they are making many healthy choices.  The patient's goal is to achieve a score of 49 or higher on this assessment.  Tonya [name] scored a 49 at entrance.  Oxygen Compliance:   Patient is currently on 0 liters at rest, 2 liters at night, and 3 liters for exercise.  Tonya [name] is not currently using a  cpap/bipap at night.  The patient is currently compliant.  The patient states that they do not have barriers that  keep them from using their oxygen.   Education:   Tonya [name] will attend education classes during the course of Pulmonary Rehab.  Education classes that will be offered to the patient are Activities of Daily Living and Energy Conservation, Pursed Lip Breathing and Diaphragmatic Breathing, Nutrition, Exercise for the Pulmonary Patient, Warning Signs of Infection, Chronic Lung Disease, Advanced Directives, Medications, and Stress and Meditation.  The patient completed an assessment at the entrance of the program and will complete it again upon discharge to demonstrate the level of understanding provided by the educational classes.  This assessment includes 14 questions regarding all of the education topics above.  Tonya [name] achieved a score of 12/14 at entrance.  Smoking Cessation:  The patient is not currently smoking.  Exercise:   Tonya [name] will be provided with an individualized Home Exercise Prescription (HEP) at the entrance of the program.  The patient will be followed by the Pulmonary Exercise Physiologist throughout the program to assist with the progression of the frequency, intensity, time, and type of exercise. The patient's long-term goal is to be exercising 30-60 minutes, 3-5 days per week. At entrance, the patient was exercising 0 days at home.

## 2014-05-10 ENCOUNTER — Encounter (HOSPITAL_COMMUNITY)
Admission: RE | Admit: 2014-05-10 | Discharge: 2014-05-10 | Disposition: A | Discharge status: Home / Self Care | Payer: Medicare Other | Source: Ambulatory Visit | Attending: Internal Medicine | Admitting: Internal Medicine

## 2014-05-10 DIAGNOSIS — Z5189 Encounter for other specified aftercare: Secondary | ICD-10-CM | POA: Diagnosis not present

## 2014-05-10 NOTE — Progress Notes (Signed)
Today, Tonya [name] exercised at [facility]. Service time was from 1030 to 1215.  The patient exercised for more than 31 minutes performing aerobic, strengthening, and stretching exercises. Oxygen saturation, heart rate, blood pressure, rate of perceived exertion, and shortness of breath were all monitored before, during, and after exercise. Tonya [name] presented with no problems at today's exercise session.   There was no workload change during today's exercise session.  Pre-exercise vitals:   Weight kg: 51.7   Liters of O2: 3   SpO2: 95   HR: 65   BP: 122/70   CBG: NA  Exercise vitals:   Highest heartrate:  83   Lowest oxygen saturation: 92   Highest blood pressure: 134/74   Liters of 02: 3  Post-exercise vitals:   SpO2: 95   HR: 73   BP: 128/60   Liters of O2: 3   CBG: NA Dr. Kalman ShanMurali Ramaswamy, Medical Director Dr. David StallFeliz-Ortiz is immediately available during today's Pulmonary Rehab session for Tonya [name] on 05/10/2014 at 1030 class time.

## 2014-05-12 ENCOUNTER — Encounter (HOSPITAL_COMMUNITY)
Admission: RE | Admit: 2014-05-12 | Discharge: 2014-05-12 | Disposition: A | Discharge status: Home / Self Care | Payer: Medicare Other | Source: Ambulatory Visit | Attending: Internal Medicine | Admitting: Internal Medicine

## 2014-05-12 DIAGNOSIS — Z5189 Encounter for other specified aftercare: Secondary | ICD-10-CM | POA: Diagnosis not present

## 2014-05-12 NOTE — Progress Notes (Signed)
Today, Tonya [name] exercised at [facility]. Service [name] was from 1030 to 1230.  The patient exercised for more than 31 minutes performing aerobic, strengthening, and stretching exercises. Oxygen saturation, heart rate, blood pressure, rate of perceived exertion, and shortness of breath were all monitored before, during, and after exercise. Tonya [name] presented with no problems at today's exercise session. Tonya [name] also attended an education session on pulmonary medications.  There was no workload change during today's exercise session.  Pre-exercise vitals:   Weight kg: 57.6   Liters of O2: 3L   SpO2: 94   HR: 77   BP: 122/70   CBG: na  Exercise vitals:   Highest heartrate:  75   Lowest oxygen saturation: 93   Highest blood pressure: 132/60   Liters of 02: 3L  Post-exercise vitals:   SpO2: 94   HR: 81   BP: 128/58   Liters of O2: 3L   CBG: na  Dr. Kalman ShanMurali Ramaswamy, Medical Director Dr. Gwenlyn PerkingMadera is immediately available during today's Pulmonary Rehab session for Tonya [name].

## 2014-05-17 ENCOUNTER — Encounter (HOSPITAL_COMMUNITY)
Admission: RE | Admit: 2014-05-17 | Discharge: 2014-05-17 | Disposition: A | Discharge status: Home / Self Care | Payer: Medicare Other | Source: Ambulatory Visit | Attending: Internal Medicine | Admitting: Internal Medicine

## 2014-05-17 DIAGNOSIS — Z5189 Encounter for other specified aftercare: Secondary | ICD-10-CM | POA: Diagnosis not present

## 2014-05-17 NOTE — Progress Notes (Signed)
Today, Tonya [name] exercised at [facility]. Service time was from 1030 to 1200.  The patient exercised for more than 31 minutes performing aerobic, strengthening, and stretching exercises. Oxygen saturation, heart rate, blood pressure, rate of perceived exertion, and shortness of breath were all monitored before, during, and after exercise. Tonya [name] presented with no problems at today's exercise session.   There was no workload change during today's exercise session.  Pre-exercise vitals:   Weight kg: 51.4   Liters of O2: 3   SpO2: 96   HR: 74   BP: 136/70   CBG: NA  Exercise vitals:   Highest heartrate:  88   Lowest oxygen saturation: 88 which increased to 92 with rest and purse lip breathing   Highest blood pressure: 124/60   Liters of 02: 3  Post-exercise vitals:   SpO2: 96   HR: 68   BP: 134/60   Liters of O2: 3   CBG: NA Dr. Kalman ShanMurali Ramaswamy, Medical Director Dr. Gwenlyn PerkingMadera is immediately available during today's Pulmonary Rehab session for Tonya [name] on 05/17/2014 at 1030 class time.

## 2014-05-19 ENCOUNTER — Encounter (HOSPITAL_COMMUNITY)
Admission: RE | Admit: 2014-05-19 | Discharge: 2014-05-19 | Disposition: A | Discharge status: Home / Self Care | Payer: Medicare Other | Source: Ambulatory Visit | Attending: Internal Medicine | Admitting: Internal Medicine

## 2014-05-19 DIAGNOSIS — Z5189 Encounter for other specified aftercare: Secondary | ICD-10-CM | POA: Diagnosis not present

## 2014-05-19 NOTE — Progress Notes (Signed)
Tonya MacadamiaSandra H Hollon 68 y.o. female Nutrition Note Spoke with pt. Pt is underweight for a pulmonary pt. Wt trends reviewed in EMR. Pt wt is down from her UBW of 118-122 lb. Pt wt is down 5 lb over the past year. 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 Pt would like to gain wt to a goal wt of 115-119 lb. Pt eats 3 meals a day.  Pt educated re: High Calorie, High Protein diet in lieu of pt's Rate Your Plate results. Pt avoids most salty foods; does not regularly use canned/ convenience food. Pt does not add salt to food. The role of sodium in lung disease reviewed with pt. Pt expressed understanding of the information reviewed.  Nutrition Diagnosis   Food-and nutrition-related knowledge deficit related to lack of exposure to information as related to diagnosis of pulmonary disease   Unintentional wt loss related to fatigue during food preparation and decreased food intake as evidenced by wt loss .    Nutrition Rx/Est. Daily Nutrition Needs for: ? wt gain 1800-2100 Kcal  80-90 gm protein   1500 mg or less sodium      Nutrition Intervention   Pt's individual nutrition plan and goals reviewed with pt.   Benefits of adopting healthy eating habits discussed when pt's Rate Your Plate reviewed.   Handouts given for: High Calorie, High Protein diet and recipes   Pt to add Boost/ Ensure or Whey protein powder to shakes at least once a day.   Pt to attend the Nutrition and Lung Disease class   Continual client-centered nutrition education by RD, as part of interdisciplinary care. Goal(s) 1. The pt will recognize symptoms that can interfere with adequate oral intake, such as shortness of breath, N/V, early satiety, fatigue, ability to secure and prepare food, taste and smell changes, chewing/swallowing difficulties, and/ or pain when eating. 2. Identify food quantities necessary to achieve wt gain  to a goal wt of 52.3-54.1 kg (115-119 lb) at graduation from pulmonary rehab. Monitor and Evaluate progress toward nutrition goal with team.   Mickle PlumbEdna Becci Batty, M.Ed, RD, LDN, CDE 05/19/2014 1:47 PM

## 2014-05-19 NOTE — Progress Notes (Addendum)
Today, Tonya [name] exercised at [facility]. Service time was from 1030 to 1230.  The patient exercised for more than 31 minutes performing aerobic, strengthening, and stretching exercises. Oxygen saturation, heart rate, blood pressure, rate of perceived exertion, and shortness of breath were all monitored before, during, and after exercise. Tonya [name] presented with no problems at today's exercise session. Tonya [name] also attended an education session on oxygen use and safety.  There was no workload change during today's exercise session.  Pre-exercise vitals:   Weight kg: 51.3   Liters of O2: 3L   SpO2: 95   HR: 76   BP: 134/64   CBG: na  Exercise vitals:   Highest heartrate:  81   Lowest oxygen saturation: 91   Highest blood pressure: 122/70   Liters of 02: 3L  Post-exercise vitals:   SpO2: 96   HR: 75   BP: 108/72   Liters of O2: 3L   CBG: na  Dr. Kalman ShanMurali Ramaswamy, Medical Director Dr. David StallFeliz-Ortiz is immediately available during today's Pulmonary Rehab session for Tonya [name] on 05/19/2014 at 1030 class time.

## 2014-05-24 ENCOUNTER — Encounter (HOSPITAL_COMMUNITY)
Admission: RE | Admit: 2014-05-24 | Discharge: 2014-05-24 | Disposition: A | Discharge status: Home / Self Care | Payer: Medicare Other | Source: Ambulatory Visit | Attending: Internal Medicine | Admitting: Internal Medicine

## 2014-05-24 DIAGNOSIS — Z5189 Encounter for other specified aftercare: Secondary | ICD-10-CM | POA: Diagnosis not present

## 2014-05-24 NOTE — Progress Notes (Signed)
Tonya MacadamiaSandra H Droz 68 y.o. female  30 day Psychosocial Note  Patient psychosocial assessment reveals no barriers to participation in Pulmonary Rehab.  Patient does feel she is making progress toward Pulmonary Rehab goals. Patient reports her health and activity level has improved very slightly in the past 30 days.  She feels that she needs a little more time to see an increase in her stamina and endurance.  She has improved on her purse lip breathing, but still needs to concentrate on the technique.   Patient states family/friends have not noticed changes in her activity or mood. Patient reports feeling positive about current and projected progression in Pulmonary Rehab.  [place] the [place].   After reviewing the patient's treatment plan, the patient is making progress toward Pulmonary Rehab goals. Patient's rate of progress toward rehab goals is fair. Plan of action to help patient continue to work towards rehab goals include increasing workloads as appropriate.  Continue education regarding COPD issues.   Will continue to monitor and evaluate progress toward psychosocial goal(s).  Goal(s) in progress Improved coping skills Help patient work toward returning to meaningful activities that improve patient's QOL and are attainable with patient's lung disease To become physically stronger and that in turn will allow her to be more active.

## 2014-05-24 NOTE — Progress Notes (Signed)
Today, Tonya [name] exercised at [facility]. Service time was from 10:30am to 12:15pm.  The patient exercised for more than 31 minutes performing aerobic, strengthening, and stretching exercises. Oxygen saturation, heart rate, blood pressure, rate of perceived exertion, and shortness of breath were all monitored before, during, and after exercise. Tonya [name] presented with no problems at today's exercise session.   There was no workload change during today's exercise session.  Pre-exercise vitals:   Weight kg: 51.3   Liters of O2: 3   SpO2: 95   HR: 81   BP: 124/60   CBG: na  Exercise vitals:   Highest heartrate:  89   Lowest oxygen saturation: 93   Highest blood pressure: 132/60   Liters of 02: 3  Post-exercise vitals:   SpO2: 95   HR: 75   BP: 102/54   Liters of O2: 3   CBG: na  Tonya [name], Medical Director Tonya [name] is immediately available during today's Pulmonary Rehab session for Tonya [name] on 05/24/14 at 10:30am class time.

## 2014-05-26 ENCOUNTER — Encounter (HOSPITAL_COMMUNITY)
Admission: RE | Admit: 2014-05-26 | Discharge: 2014-05-26 | Disposition: A | Discharge status: Home / Self Care | Payer: Medicare Other | Source: Ambulatory Visit | Attending: Internal Medicine | Admitting: Internal Medicine

## 2014-05-26 DIAGNOSIS — Z5189 Encounter for other specified aftercare: Secondary | ICD-10-CM | POA: Diagnosis not present

## 2014-05-26 NOTE — Progress Notes (Signed)
Today, Tonya [name] exercised at [facility]. Service time was from 10:30am to 12:30pm.  The patient exercised for more than 31 minutes performing aerobic, strengthening, and stretching exercises. Oxygen saturation, heart rate, blood pressure, rate of perceived exertion, and shortness of breath were all monitored before, during, and after exercise. Tonya [name] presented with no problems at today's exercise session. The patient attended education class [facility] with [facility] regarding Oxygen Use and Delivery Systems.  There was an increase in workload change during today's exercise session.  Pre-exercise vitals:   Weight kg: 51.3   Liters of O2: ra   SpO2: 91   HR: 70   BP: 100/52   CBG: na  Exercise vitals:   Highest heartrate:  84   Lowest oxygen saturation: 91   Highest blood pressure: 118/62   Liters of 02: 3  Post-exercise vitals:   SpO2: 96   HR: 82   BP: 110/60   Liters of O2: 3   CBG: na  Dr. Kalman ShanMurali Ramaswamy, Medical Director [name] is immediately available during today's Pulmonary Rehab session for Tonya [name] on 05/26/14 at 10:30am class time.

## 2014-05-31 ENCOUNTER — Encounter (HOSPITAL_COMMUNITY)
Admission: RE | Admit: 2014-05-31 | Discharge: 2014-05-31 | Disposition: A | Discharge status: Home / Self Care | Payer: Medicare Other | Source: Ambulatory Visit | Attending: Internal Medicine | Admitting: Internal Medicine

## 2014-05-31 DIAGNOSIS — Z5189 Encounter for other specified aftercare: Secondary | ICD-10-CM | POA: Diagnosis not present

## 2014-05-31 NOTE — Progress Notes (Signed)
Today, Tonya [name] exercised at [facility]. Service time was from 1030 to 1215.  The patient exercised for more than 31 minutes performing aerobic, strengthening, and stretching exercises. Oxygen saturation, heart rate, blood pressure, rate of perceived exertion, and shortness of breath were all monitored before, during, and after exercise. Tonya [name] presented with no problems at today's exercise session.   There was a workload change during today's exercise session.  Pre-exercise vitals:   Weight kg: 51.3   Liters of O2: 3L   SpO2: 95   HR: 70   BP: 124/60   CBG: na  Exercise vitals:   Highest heartrate:  89   Lowest oxygen saturation: 88 increased to 93 with PLB and rest break   Highest blood pressure: 128/70   Liters of 02: 3L  Post-exercise vitals:   SpO2: 96   HR: 71   BP: 116/60   Liters of O2: 3L   CBG: na  Dr. Kalman ShanMurali Ramaswamy, Medical Director [name] is immediately available during today's Pulmonary Rehab session for Tonya [name] on 05/31/2014 at 1030 class time.

## 2014-06-02 ENCOUNTER — Encounter (HOSPITAL_COMMUNITY)
Admission: RE | Admit: 2014-06-02 | Discharge: 2014-06-02 | Disposition: A | Discharge status: Home / Self Care | Payer: Medicare Other | Source: Ambulatory Visit | Attending: Internal Medicine | Admitting: Internal Medicine

## 2014-06-02 DIAGNOSIS — Z5189 Encounter for other specified aftercare: Secondary | ICD-10-CM | POA: Diagnosis not present

## 2014-06-02 NOTE — Progress Notes (Signed)
Today, Tonya [name] exercised at [facility]. Service time was from 10:30am to 12:30pm.  The patient exercised for more than 31 minutes performing aerobic, strengthening, and stretching exercises. Oxygen saturation, heart rate, blood pressure, rate of perceived exertion, and shortness of breath were all monitored before, during, and after exercise. Tonya [name] presented with no problems at today's exercise session. The patient attended education class with Theda BelfastBob Hamilton on Advanced Directives.  There was an increase in workload change during today's exercise session.  Pre-exercise vitals:   Weight kg: 51.5   Liters of O2: 3   SpO2: 93   HR: 73   BP: 148/58   CBG: na  Exercise vitals:   Highest heartrate:  84   Lowest oxygen saturation: 94   Highest blood pressure: 108/62   Liters of 02: 3  Post-exercise vitals:   SpO2: 95   HR: 76   BP: 114/60   Liters of O2: 3   CBG: na  Dr. Kalman ShanMurali Ramaswamy, Medical Director Dr. David StallFeliz-Ortiz is immediately available during today's Pulmonary Rehab session for Tonya [name] on 06/02/14 at 10:30am class time.

## 2014-06-07 ENCOUNTER — Encounter (HOSPITAL_COMMUNITY)
Admission: RE | Admit: 2014-06-07 | Discharge: 2014-06-07 | Disposition: A | Discharge status: Home / Self Care | Payer: Medicare Other | Source: Ambulatory Visit | Attending: Internal Medicine | Admitting: Internal Medicine

## 2014-06-07 ENCOUNTER — Telehealth: Payer: Self-pay | Admitting: Internal Medicine

## 2014-06-07 DIAGNOSIS — Z5189 Encounter for other specified aftercare: Secondary | ICD-10-CM | POA: Diagnosis not present

## 2014-06-07 DIAGNOSIS — J449 Chronic obstructive pulmonary disease, unspecified: Secondary | ICD-10-CM | POA: Diagnosis not present

## 2014-06-07 DIAGNOSIS — J479 Bronchiectasis, uncomplicated: Secondary | ICD-10-CM | POA: Insufficient documentation

## 2014-06-07 DIAGNOSIS — J9612 Chronic respiratory failure with hypercapnia: Secondary | ICD-10-CM

## 2014-06-07 NOTE — Progress Notes (Signed)
I have reviewed a Home Exercise Prescription with Lovie MacadamiaSandra H [name] . Dois DavenportSandra is currently exercising at home by performing chair exercises once a week.  The patient was advised to walk 2-3 days a week for 20-30 minutes.  Dois DavenportSandra and I discussed how to progress their exercise prescription.  The patient stated that their goals were to build walking endurance, breathe better, and get around better.  The patient stated that they understand the exercise prescription.  We reviewed exercise guidelines, target heart rate during exercise, oxygen use, weather, home pulse oximeter, endpoints for exercise, and goals.  Patient is encouraged to come to me with any questions. I will continue to follow up with the patient to assist them with progression and safety.

## 2014-06-07 NOTE — Progress Notes (Signed)
Today, Tonya [name] exercised at [facility]. Service time was from 10:30AM to 12:30PM.  The patient exercised for more than 31 minutes performing aerobic, strengthening, and stretching exercises. Oxygen saturation, heart rate, blood pressure, rate of perceived exertion, and shortness of breath were all monitored before, during, and after exercise. Tonya [name] presented with no problems at today's exercise session.   There was an increase in workload change during today's exercise session.  Pre-exercise vitals: . Weight kg: 51.2 . Liters of O2: 3 . SpO2: 93 . HR: 65 . BP: 136/66 . CBG: na  Exercise vitals: . Highest heartrate:  94 . Lowest oxygen saturation: 94 . Highest blood pressure: 144/70 . Liters of 02: 3  Post-exercise vitals: . SpO2: 99 . HR: 69 . BP: 110/58 . Liters of O2: 3 . CBG: na  Dr. Kalman ShanMurali Ramaswamy, Medical Director Dr. David StallFeliz-Ortiz is immediately available during today's Pulmonary Rehab session for Tonya [name] on 06/07/14 at 10:30am class time.

## 2014-06-07 NOTE — Telephone Encounter (Signed)
MW please advise if okay to order POC for pt? thanks

## 2014-06-07 NOTE — Telephone Encounter (Signed)
That's fine with me if it's legit to use their numbers for 02 qualification purposes

## 2014-06-08 NOTE — Telephone Encounter (Signed)
Needs orders thanks Tobe SosSally E Ottinger

## 2014-06-08 NOTE — Telephone Encounter (Signed)
Order has been placed. Nothing further needed. 

## 2014-06-09 ENCOUNTER — Telehealth: Payer: Self-pay | Admitting: Internal Medicine

## 2014-06-09 ENCOUNTER — Encounter (HOSPITAL_COMMUNITY)
Admission: RE | Admit: 2014-06-09 | Discharge: 2014-06-09 | Disposition: A | Discharge status: Home / Self Care | Payer: Medicare Other | Source: Ambulatory Visit | Attending: Internal Medicine | Admitting: Internal Medicine

## 2014-06-09 DIAGNOSIS — Z5189 Encounter for other specified aftercare: Secondary | ICD-10-CM | POA: Diagnosis not present

## 2014-06-09 NOTE — Telephone Encounter (Signed)
Could not find qualifying sats more recent than march 2015  Spoke with Melissa and advised we will obtain qualifying sats at pending appt here on 06/29/14  Notes regarding this were added to appt notes

## 2014-06-09 NOTE — Telephone Encounter (Signed)
Decided not to leave a message yet.  Antionette FairyHolly D Pryor

## 2014-06-09 NOTE — Progress Notes (Signed)
Today, Tonya [name] exercised at [facility]. Service time was from 10:30am to 12:30pm.  The patient exercised for more than 31 minutes performing aerobic, strengthening, and stretching exercises. Oxygen saturation, heart rate, blood pressure, rate of perceived exertion, and shortness of breath were all monitored before, during, and after exercise. Tonya [name] presented with no problems at today's exercise session. Patient attended education with Gardiner FantiEdna Frank on [employer].  There was no workload change during today's exercise session.  Pre-exercise vitals: . Weight kg: 51.3 . Liters of O2: ra . SpO2: 90 . HR: 72 . BP: 130/60 . CBG: na  Exercise vitals: . Highest heartrate:  72 . Lowest oxygen saturation: 94 . Highest blood pressure: 122/70 . Liters of 02: 3  Post-exercise vitals: . SpO2: 94 . HR: 69 . BP: 106/54 . Liters of O2: 3 . CBG: na  Dr. Kalman ShanMurali Ramaswamy, Medical Director [name] is immediately available during today's Pulmonary Rehab session for Tonya [name] on 06/09/14 at 10:30am class time.

## 2014-06-14 ENCOUNTER — Encounter (HOSPITAL_COMMUNITY)
Admission: RE | Admit: 2014-06-14 | Discharge: 2014-06-14 | Disposition: A | Discharge status: Home / Self Care | Payer: Medicare Other | Source: Ambulatory Visit | Attending: Internal Medicine | Admitting: Internal Medicine

## 2014-06-14 DIAGNOSIS — Z5189 Encounter for other specified aftercare: Secondary | ICD-10-CM | POA: Diagnosis not present

## 2014-06-14 NOTE — Psychosocial Assessment (Signed)
Tonya MacadamiaSandra H [name] 68 y.o. female  6760 day Psychosocial Note  Patient psychosocial assessment reveals no barriers to participation in Pulmonary Rehab. Patient does feel she is making progress toward Pulmonary Rehab goals. Patient reports her health and activity level has improved in the past 30 days as evidenced by patient's report of increased ability to be more active.  She has actually made  two weekend trips recently, one to the beach and [facility].  She cares for her husband at home and this is very strenuous and demanding.  Her daughter and son in law have helped care for her husband when theses two trips occurred.  She feels she has more energy, and has been able to go shopping with greater ease.  She still goes a slow pace, takes rest breaks frequently,  and purse lip breathes.  She feels these 3 things have made a great difference in her activity.   Patient states family/friends have noticed changes in her activity or mood. Patient reports feeling positive about current and projected progression in Pulmonary Rehab. After reviewing the patient's treatment plan, the patient is making progress toward Pulmonary Rehab goals. Patient's rate of progress toward rehab goals is good. Plan of action to help patient continue to work towards rehab goals include increasing her workloads as appropriate. Will continue to monitor and evaluate progress toward psychosocial goal(s).  Goal(s) in progress: To become physically stronger Help patient work toward returning to meaningful activities that improve patient's QOL and are attainable with patient's lung disease To manage her breathing while being active.

## 2014-06-14 NOTE — Progress Notes (Signed)
Today, Tonya [name] exercised at [facility]. Service time was from 1030 to 1215.  The patient exercised for more than 31 minutes performing aerobic, strengthening, and stretching exercises. Oxygen saturation, heart rate, blood pressure, rate of perceived exertion, and shortness of breath were all monitored before, during, and after exercise. Tonya [name] presented with no problems at today's exercise session.   There was one workload change during today's exercise session.  Pre-exercise vitals: . Weight kg: 51.3 . Liters of O2: 3 . SpO2: 94 . HR: 77 . BP: 140/66 . CBG: na  Exercise vitals: . Highest heartrate:  85 . Lowest oxygen saturation: 88 which increased to 92 with purse lip breathing .  Marland Kitchen. Highest blood pressure: 122/60 . Liters of 02: 3  Post-exercise vitals: . SpO2: 97 . HR: 80 . BP: 124/60 . Liters of O2: 3 . CBG: na Dr. Kalman ShanMurali Ramaswamy, Medical Director [name] is immediately available during today's Pulmonary Rehab session for Tonya [name] on 06/14/2014 at 1030 class time.  .Marland Kitchen

## 2014-06-16 ENCOUNTER — Encounter (HOSPITAL_COMMUNITY)
Admission: RE | Admit: 2014-06-16 | Discharge: 2014-06-16 | Disposition: A | Discharge status: Home / Self Care | Payer: Medicare Other | Source: Ambulatory Visit | Attending: Internal Medicine | Admitting: Internal Medicine

## 2014-06-16 ENCOUNTER — Telehealth: Payer: Self-pay | Admitting: Internal Medicine

## 2014-06-16 DIAGNOSIS — Z5189 Encounter for other specified aftercare: Secondary | ICD-10-CM | POA: Diagnosis not present

## 2014-06-16 MED ORDER — BUDESONIDE-FORMOTEROL FUMARATE 160-4.5 MCG/ACT IN AERO: INHALATION_SPRAY | RESPIRATORY_TRACT | Status: DC

## 2014-06-16 NOTE — Telephone Encounter (Signed)
[phone] returning call

## 2014-06-16 NOTE — Telephone Encounter (Signed)
Spoke with pt and advised that [facility] for us to give them qualifying sats to complete order for POC.  Moved pt's appt with TP earlier so we can process this order.  Also left sample of Symbicort for pt to pick up.

## 2014-06-16 NOTE — Telephone Encounter (Signed)
lmtcb for pt.  

## 2014-06-16 NOTE — Progress Notes (Signed)
Today, Tonya [name] exercised at [facility]. Service time was from 10:30am to 12:30pm.  The patient exercised for more than 31 minutes performing aerobic, strengthening, and stretching exercises. Oxygen saturation, heart rate, blood pressure, rate of perceived exertion, and shortness of breath were all monitored before, during, and after exercise. Tonya [name] presented with no problems at today's exercise session.  Patient attended education class today with Yvone NeuPortia Payne on Oxygen Use and Safety.  There was no workload change during today's exercise session.  Pre-exercise vitals: . Weight kg: 51.3 . Liters of O2: 3 . SpO2: 94 . HR: 83 . BP: 122/62 . CBG: na  Exercise vitals: . Highest heartrate:  92 . Lowest oxygen saturation: 90 . Highest blood pressure: 120/54 . Liters of 02: 3  Post-exercise vitals: . SpO2: 98 . HR: 68 . BP: 110/54 . Liters of O2: 3 . CBG: na  Dr. Kalman ShanMurali Ramaswamy, Medical Director Dr. Rhona Leavenshiu is immediately available during today's Pulmonary Rehab session for Tonya MacadamiaSandra H [name] on 06/16/14 at 10:30am class time.

## 2014-06-21 ENCOUNTER — Encounter (HOSPITAL_COMMUNITY)
Admission: RE | Admit: 2014-06-21 | Discharge: 2014-06-21 | Disposition: A | Discharge status: Home / Self Care | Payer: Medicare Other | Source: Ambulatory Visit | Attending: Internal Medicine | Admitting: Internal Medicine

## 2014-06-21 DIAGNOSIS — Z5189 Encounter for other specified aftercare: Secondary | ICD-10-CM | POA: Diagnosis not present

## 2014-06-21 NOTE — Progress Notes (Signed)
Today, Tonya [name] exercised at [facility]. Service time was from 10:30am to 12:00pm.  The patient exercised for more than 31 minutes performing aerobic, strengthening, and stretching exercises. Oxygen saturation, heart rate, blood pressure, rate of perceived exertion, and shortness of breath were all monitored before, during, and after exercise. Tonya [name] presented with no problems at today's exercise session.   There was an increase in workload change during today's exercise session.  Pre-exercise vitals: . Weight kg: 51.5 . Liters of O2: 3 . SpO2: 93 . HR: 71 . BP: 132/58 . CBG: na  Exercise vitals: . Highest heartrate:  84 . Lowest oxygen saturation: 88% . Highest blood pressure: 132/60 . Liters of 02: 3  Post-exercise vitals: . SpO2: 97 . HR: 72 . BP: 110/60 . Liters of O2: 3 . CBG: na  Dr. Kalman ShanMurali Ramaswamy, Medical Director Dr. Rhona Leavenshiu is immediately available during today's Pulmonary Rehab session for Lovie MacadamiaSandra H Cullop on 06/21/14 at 10:30am class time.

## 2014-06-23 ENCOUNTER — Encounter (HOSPITAL_COMMUNITY)
Admission: RE | Admit: 2014-06-23 | Discharge: 2014-06-23 | Disposition: A | Discharge status: Home / Self Care | Payer: Medicare Other | Source: Ambulatory Visit | Attending: Internal Medicine | Admitting: Internal Medicine

## 2014-06-23 DIAGNOSIS — Z5189 Encounter for other specified aftercare: Secondary | ICD-10-CM | POA: Diagnosis not present

## 2014-06-23 NOTE — Progress Notes (Signed)
Today, Dois DavenportSandra exercised at [facility]. Service time was from 10:30a to 12:30p.  The patient exercised for more than 31 minutes performing aerobic, strengthening, and stretching exercises. Oxygen saturation, heart rate, blood pressure, rate of perceived exertion, and shortness of breath were all monitored before, during, and after exercise. Dois DavenportSandra presented with no problems at today's exercise session. The patient attended education class today with Lindaann SloughMolly Brady on "Exercise for the Pulmonary Patient".  There was no workload change during today's exercise session.  Pre-exercise vitals: . Weight kg: 51.5 . Liters of O2: 3 . SpO2: 94 . HR: 82 . BP: 112/44 . CBG: na  Exercise vitals: . Highest heartrate:  96 . Lowest oxygen saturation: 93 . Highest blood pressure: 122/70 . Liters of 02: 3  Post-exercise vitals: . SpO2: 94 . HR: 71 . BP: 126/74 . Liters of O2: 3 . CBG: na  Dr. Kalman ShanMurali Ramaswamy, Medical Director [name] is immediately available during today's Pulmonary Rehab session for Lovie MacadamiaSandra H Bowden on 06/23/14 at 10:30am class time.

## 2014-06-27 ENCOUNTER — Encounter: Payer: Self-pay | Admitting: Adult Health

## 2014-06-27 ENCOUNTER — Ambulatory Visit (INDEPENDENT_AMBULATORY_CARE_PROVIDER_SITE_OTHER): Payer: Medicare Other | Admitting: Adult Health

## 2014-06-27 VITALS — BP 122/66 | HR 78 | Temp 97.0°F | Ht 61.75 in | Wt 113.2 lb

## 2014-06-27 DIAGNOSIS — J9612 Chronic respiratory failure with hypercapnia: Secondary | ICD-10-CM

## 2014-06-27 DIAGNOSIS — J479 Bronchiectasis, uncomplicated: Secondary | ICD-10-CM

## 2014-06-27 MED ORDER — BUDESONIDE-FORMOTEROL FUMARATE 160-4.5 MCG/ACT IN AERO: 2.0000 | INHALATION_SPRAY | Freq: Two times a day (BID) | RESPIRATORY_TRACT | Status: DC

## 2014-06-27 NOTE — Addendum Note (Signed)
Addended by: Boone MasterJONES, Maelin Kurkowski E on: 06/27/2014 05:03 PM   Modules accepted: Orders

## 2014-06-27 NOTE — Progress Notes (Signed)
Subjective:    Patient ID: Tonya [name], female    DOB: 11/06/1945   MRN: [id number]  Brief patient profile:  [age] yowf never smoker with documented bronchiectasis with chronic cough with all of her symptoms improved on Symbicort 80/4.5 two puffs b.i.d.    History of Present Illness  03/18/2013 f/u ov/Wert re bronchiectassis/ "copd" Chief Complaint  Patient presents with  . Follow-up    Pt states overall doing well, she c/o PND and runny nose for the past month.   has not tried zyrtec  No nasty mucus No sob or need for saba on symbicort 160 2bid rec Prn zyrtec     12/24/2013 f/u ov/Wert re:  Chief Complaint  Patient presents with  . Follow-up    c/o SOB with exertion, prod cough with green to clear mucous.  Increased dizziness when bending down after switching BP meds.   overall much better in terms of resp symptoms but light headed if gets up too quickly sometimes since change to valsartan. Not limited by breathing from desired activities  / rare need for saba  rec For nasty mucus > Zpak as needed  Ok to change back to losartan to see if your dizziness gets better and if so continue it unless the cough/ breathing get worse but let Dr Tenny Crawoss make the final call on what's the best option for your blood pressure (there are many many alternatives that should work) Facilities managerrevnar given     03/24/2014 f/u ov/Wert re: bronchiectasis with  ? Component uacs  Chief Complaint  Patient presents with  . Follow-up    Pt states that cough has been increased--using Mucinex for increased mucus. Pt states that anxiety levels have worsened some and she notices this contributes to her breathing.   freq urge to have bm ? From PPI? No increase in purulent sputum but worse cough when flutter valve broke and ordering a new one on line  Only sob when over does it like long walks or lifting  rec Try off pantoprazole and take an extra pepcid 20 mg after bfast to see if makes a difference with your urge  to have bm Try using xopenex before activity where you know you will be short of breath   04/13/2014 f/u ov/Wert re: bronchiectasis/ refractory Chief Complaint  Patient presents with  . Acute Visit    Pt states cough started to worsen after last visit 03/24/14. She has taken zpack. Cough is prod with yellow sputum.   did not change am pantoprozole but the cough is worse in pm before taking pepcid at hs. Mucus still yellowish>  Less dark p zpak rx she uses prn  Not limited by breathing from desired activities   >>no changes     06/27/2014 Follow up and Med   re: bronchiectasis/ refractory  Patient returns for a follow-up and medication review  We reviewed all her medications organize them into a medication count with patient education. Cough and congestion are better, Mucinex and Flutter really help On O2 at 2l/m At bedtime  .  In pulmonary rehab , needs O2 with exercise.  Today in office sats drop 86% on RA  O2 at rest >90% .  O2 on 3lm with walking kept O2 sats >90%.  Denies chest pain, orthopnea, edema or hemoptysis or fever.    Current Medications, Allergies, Complete Past Medical History, Past Surgical History, Family History, and Social History were reviewed in [facility] electronic medical record.  ROS  The following  are not active complaints unless bolded sore throat, dysphagia, dental problems, itching, sneezing,  nasal congestion or excess/ purulent secretions, ear ache,   fever, chills, sweats, unintended wt loss, pleuritic or exertional cp, hemoptysis,  orthopnea pnd or leg swelling, presyncope, palpitations, heartburn, abdominal pain, anorexia, nausea, vomiting, diarrhea  or change in bowel or urinary habits, change in stools or urine, dysuria,hematuria,  rash, arthralgias, visual complaints, headache, numbness weakness or ataxia or problems with walking or coordination,  change in mood/affect or memory.          Past Medical History:  Bronchiectasis  - CT chest and  sinus September 08, 2009 >> mild diffuse bronchiectasis with mild sphenoid sinusitis  - HFA poor September 05, 2009 > 75 % with coaching January 30, 2010  Hypertension  GERD..................................................................................Marland Kitchen.Edwards  - Pos EGD 05/13/08  Complex med regimen  ---Meds reviewed with pt education and computerized med calendar completed/adjusted October 17, 2009 , 02/18/2011 , 11/16/2012 . 06/27/2014         Objective:   Physical Exam  Amb wf nad  / nad   wt 1 132 October 04, 2008 >    126 January 30, 2010 > 02/12/2012 124 > 05/27/2012  126 > 122 08/26/2012 > 10/15/2012  119>118 11/16/2012 > 118 03/18/2013 > 114 10/29/2013 >113 11/12/2013 > 116 12/24/2013 > 03/24/2014  118 > 04/13/2014 113 >113 06/27/2014   HEENT mild turbinate edema, non-tender sinus, clear discharge. Oropharynx no thrush or excess pnd or cobblestoning. No JVD or cervical adenopathy. Mild accessory muscle hypertrophy. Trachea midline, nl thryroid. Chest was hyperinflated by percussion with diminished breath sounds in bases ,no wheezing. Regular rate and rhythm without murmur gallop or rub or increase P2. No edema Abd: no hsm, nl excursion. Ext warm without cyanosis or clubbing.        cxr 3/271/5  No acute cardiopulmonary disease. Basilar interstitial prominence consistent with interstitial fibrosis noted. Chest is stable from prior exam.   Lab Results  Component Value Date   PROBNP 81.0 10/29/2013     Recent Labs Lab 10/29/13 1713  NA 141  K 3.8  CL 100  CO2 35*  BUN 10  CREATININE 0.7  GLUCOSE 86    Recent Labs Lab 10/29/13 1713  HGB 15.9*  HCT 48.0*  WBC 11.4*  PLT 233.0       Assessment & Plan:

## 2014-06-27 NOTE — Addendum Note (Signed)
Addended by: Boone MasterJONES, Gilberta Peeters E on: 06/27/2014 04:58 PM   Modules accepted: Orders

## 2014-06-27 NOTE — Patient Instructions (Signed)
Continue on current regimen Keep up good work  We are setting you up for Portable oxygen to wear with exercise/activity .  Follow med calendar closely and bring to each visit.  follow up Dr. Sherene SiresWert  In 4  months and As needed

## 2014-06-27 NOTE — Assessment & Plan Note (Signed)
O2 sats drop 86% on RA with walking  3l/m keep sats >90%.   Plan  Begin O2 with act and exercise at 3l/m

## 2014-06-27 NOTE — Assessment & Plan Note (Signed)
Stable without flare  Patient's medications were reviewed today and patient education was given. Computerized medication calendar was adjusted/completed   Plan  Continue on current regimen Keep up good work  [employer] calendar closely and bring to each visit.  follow up Dr. Sherene SiresWert  In 4  months and As needed

## 2014-06-28 ENCOUNTER — Telehealth: Payer: Self-pay | Admitting: Adult Health

## 2014-06-28 ENCOUNTER — Encounter (HOSPITAL_COMMUNITY)
Admission: RE | Admit: 2014-06-28 | Discharge: 2014-06-28 | Disposition: A | Discharge status: Home / Self Care | Payer: Medicare Other | Source: Ambulatory Visit | Attending: Internal Medicine | Admitting: Internal Medicine

## 2014-06-28 DIAGNOSIS — Z5189 Encounter for other specified aftercare: Secondary | ICD-10-CM | POA: Diagnosis not present

## 2014-06-28 NOTE — Telephone Encounter (Signed)
Tonya [name] had already spoken with me regarding this Order addended to the 11.23.15 ov note Will sign off

## 2014-06-28 NOTE — Telephone Encounter (Signed)
Spoke with JJ about this; she will take care of this. Forwarded message as requested.

## 2014-06-28 NOTE — Progress Notes (Signed)
Today, Tonya [name] exercised at [facility]. Service time was from 1030 to 1210.  The patient exercised for more than 31 minutes performing aerobic, strengthening, and stretching exercises. Oxygen saturation, heart rate, blood pressure, rate of perceived exertion, and shortness of breath were all monitored before, during, and after exercise. Tonya [name] presented with no problems at today's exercise session.   There was no workload change during today's exercise session.  Pre-exercise vitals: . Weight kg: 51.1 . Liters of O2: 3 . SpO2: 94 . HR: 73 . BP: 120/74 . CBG: na  Exercise vitals: . Highest heartrate:  82 . Lowest oxygen saturation: 92 . Highest blood pressure: 110/70 . Liters of 02: 3  Post-exercise vitals: . SpO2: 97 . HR: 75 . BP: 110/58 . Liters of O2: 3 . CBG: na Dr. Kalman ShanMurali Ramaswamy, Medical Director [name] is immediately available during today's Pulmonary Rehab session for Lovie MacadamiaSandra H Dacy on 06/28/2014 at 1030 class time.  .Marland Kitchen

## 2014-06-28 NOTE — Addendum Note (Signed)
Addended by: Boone MasterJONES, JESSICA E on: 06/28/2014 03:27 PM   Modules accepted: Orders

## 2014-06-28 NOTE — Addendum Note (Signed)
Addended by: Boone MasterJONES, JESSICA E on: 06/28/2014 01:15 PM   Modules accepted: Orders

## 2014-06-28 NOTE — Psychosocial Assessment (Signed)
Tonya [name] 68 y.o. female  7490 day  Psychosocial Note  Patient psychosocial assessment reveals no barriers to participation in Pulmonary Rehab. Patient does feel she is making progress toward Pulmonary Rehab goals. Patient reports her health and activity level has improved in the past 30 days as evidenced by patient's report of increased ability to be more active at home, specifically shopping is easier and household chores. Patient states family/friends have noticed changes in her activity or mood.  Patient's daughter has noticed her increased stamina and a positive attitude.   Patient reports feeling positive about current and projected progression in Pulmonary Rehab. After reviewing the patient's treatment plan, the patient is making progress toward Pulmonary Rehab goals. Patient's rate of progress toward rehab goals is fair. Plan of action to help patient continue to work towards rehab goals include increasing workloads as appropriate.  Continue with education of her pulmonary disease.   Will continue to monitor and evaluate progress toward psychosocial goal(s).  Goal(s) in progress: Continue increasing strength and endurance Continue encouraging purse lip breathing Help patient work toward returning to meaningful activities that improve patient's QOL and are attainable with patient's lung disease

## 2014-06-29 ENCOUNTER — Encounter: Payer: Medicare Other | Admitting: Adult Health

## 2014-06-30 ENCOUNTER — Encounter (HOSPITAL_COMMUNITY): Payer: Medicare Other

## 2014-07-05 ENCOUNTER — Encounter (HOSPITAL_COMMUNITY)
Admission: RE | Admit: 2014-07-05 | Discharge: 2014-07-05 | Disposition: A | Discharge status: Home / Self Care | Payer: Medicare Other | Source: Ambulatory Visit | Attending: Internal Medicine | Admitting: Internal Medicine

## 2014-07-05 DIAGNOSIS — J479 Bronchiectasis, uncomplicated: Secondary | ICD-10-CM | POA: Insufficient documentation

## 2014-07-05 DIAGNOSIS — J449 Chronic obstructive pulmonary disease, unspecified: Secondary | ICD-10-CM | POA: Insufficient documentation

## 2014-07-05 DIAGNOSIS — Z5189 Encounter for other specified aftercare: Secondary | ICD-10-CM | POA: Diagnosis not present

## 2014-07-05 NOTE — Progress Notes (Signed)
Today, Tonya [name] exercised at [facility]. Service time was from 10:30am to 12:15pm.  The patient exercised for more than 31 minutes performing aerobic, strengthening, and stretching exercises. Oxygen saturation, heart rate, blood pressure, rate of perceived exertion, and shortness of breath were all monitored before, during, and after exercise. Tonya [name] presented with no problems at today's exercise session.   There was no workload change during today's exercise session.  Pre-exercise vitals: . Weight kg: 51.1 . Liters of O2: 3 . SpO2: 93 . HR: 81 . BP: 124/64 . CBG: na  Exercise vitals: . Highest heartrate:  95 . Lowest oxygen saturation: 94 . Highest blood pressure: 122/60 . Liters of 02: 3  Post-exercise vitals: . SpO2: 98 . HR: 77 . BP: 114/60 . Liters of O2: 3 . CBG: na  Dr. Kalman ShanMurali Ramaswamy, Medical Director Dr. Randol KernElgergawy is immediately available during today's Pulmonary Rehab session for Tonya [name] on 07/05/14 at 10:30am class time.

## 2014-07-07 ENCOUNTER — Encounter (HOSPITAL_COMMUNITY)
Admission: RE | Admit: 2014-07-07 | Discharge: 2014-07-07 | Disposition: A | Discharge status: Home / Self Care | Payer: Medicare Other | Source: Ambulatory Visit | Attending: Internal Medicine | Admitting: Internal Medicine

## 2014-07-07 DIAGNOSIS — Z5189 Encounter for other specified aftercare: Secondary | ICD-10-CM | POA: Diagnosis not present

## 2014-07-07 NOTE — Progress Notes (Addendum)
Today, Tonya [name] exercised at [facility]. Service time was from 1030 to 1225.  The patient exercised for more than 31 minutes performing aerobic, strengthening, and stretching exercises. Oxygen saturation, heart rate, blood pressure, rate of perceived exertion, and shortness of breath were all monitored before, during, and after exercise. Tonya [name] presented with no problems at today's exercise session. Tonya [name] also attended an education session on pulmonary medications.  There was no workload change during today's exercise session.  Pre-exercise vitals: . Weight kg: 50.9 . Liters of O2: 3L . SpO2: 94 . HR: 78 . BP: 112/62 . CBG: na  Exercise vitals: . Highest heartrate:  81 . Lowest oxygen saturation: 95 . Highest blood pressure: 122/76 . Liters of 02: 3L  Post-exercise vitals: . SpO2: 97 . HR: 71 . BP: 112/72 . Liters of O2: 3L . CBG: na  Dr. Kalman ShanMurali Ramaswamy, Medical Director Dr. Isidoro Donningai is immediately available during today's Pulmonary Rehab session for Tonya [name] on 07/07/2014 at 1030 class time.

## 2014-07-12 ENCOUNTER — Encounter (HOSPITAL_COMMUNITY)
Admission: RE | Admit: 2014-07-12 | Discharge: 2014-07-12 | Disposition: A | Discharge status: Home / Self Care | Payer: Medicare Other | Source: Ambulatory Visit | Attending: Internal Medicine | Admitting: Internal Medicine

## 2014-07-12 DIAGNOSIS — Z5189 Encounter for other specified aftercare: Secondary | ICD-10-CM | POA: Diagnosis not present

## 2014-07-12 NOTE — Progress Notes (Signed)
Today, Tonya [name] exercised at [facility]. Service time was from 10:30pm to 12:00pm.  The patient exercised for more than 31 minutes performing aerobic, strengthening, and stretching exercises. Oxygen saturation, heart rate, blood pressure, rate of perceived exertion, and shortness of breath were all monitored before, during, and after exercise. Tonya [name] presented with no problems at today's exercise session.   There was no workload change during today's exercise session.  Pre-exercise vitals: . Weight kg: 50.8 . Liters of O2: 3 . SpO2: 92 . HR: 67 . BP: 124/62 . CBG: na  Exercise vitals: . Highest heartrate:  86 . Lowest oxygen saturation: 92 . Highest blood pressure: 132/64 . Liters of 02: 3  Post-exercise vitals: . SpO2: 96 . HR: 77 . BP: 122/64 . Liters of O2: 3 . CBG: na  Dr. Kalman ShanMurali Ramaswamy, Medical Director Dr. Isidoro Donningai is immediately available during today's Pulmonary Rehab session for Tonya [name] on 07/12/14 at 10:30am class time.

## 2014-07-13 NOTE — Addendum Note (Signed)
Addended by: Boone MasterJONES, JESSICA E on: 07/13/2014 09:53 AM   Modules accepted: Orders, Medications

## 2014-07-14 ENCOUNTER — Encounter (HOSPITAL_COMMUNITY)
Admission: RE | Admit: 2014-07-14 | Discharge: 2014-07-14 | Disposition: A | Discharge status: Home / Self Care | Payer: Medicare Other | Source: Ambulatory Visit | Attending: Internal Medicine | Admitting: Internal Medicine

## 2014-07-14 DIAGNOSIS — Z5189 Encounter for other specified aftercare: Secondary | ICD-10-CM | POA: Diagnosis not present

## 2014-07-14 NOTE — Progress Notes (Signed)
Today, Tonya [name] exercised at [facility]. Service time was from 1030 to 1220.  The patient exercised for more than 31 minutes performing aerobic, strengthening, and stretching exercises. Oxygen saturation, heart rate, blood pressure, rate of perceived exertion, and shortness of breath were all monitored before, during, and after exercise. Tonya [name] presented with no problems at today's exercise session. Tonya [name] also attended an education session on advanced directives.  There was no workload change during today's exercise session.  Pre-exercise vitals: . Weight kg: 50.8 . Liters of O2: 3L . SpO2: 91 . HR: 73 . BP: 112/60 . CBG: na  Exercise vitals: . Highest heartrate:  78 . Lowest oxygen saturation: 90 . Highest blood pressure: 126/70 . Liters of 02: 3L  Post-exercise vitals: . SpO2: 98 . HR: 74 . BP: 110/60 . Liters of O2: 3L . CBG: na  Dr. Kalman ShanMurali Ramaswamy, Medical Director Dr. Vanessa BarbaraZamora is immediately available during today's Pulmonary Rehab session for Lovie MacadamiaSandra H Haller on 07/14/2014 at 1030 class time.

## 2014-07-19 ENCOUNTER — Encounter (HOSPITAL_COMMUNITY)
Admission: RE | Admit: 2014-07-19 | Discharge: 2014-07-19 | Disposition: A | Discharge status: Home / Self Care | Payer: Medicare Other | Source: Ambulatory Visit | Attending: Internal Medicine | Admitting: Internal Medicine

## 2014-07-19 DIAGNOSIS — Z5189 Encounter for other specified aftercare: Secondary | ICD-10-CM | POA: Diagnosis not present

## 2014-07-19 NOTE — Progress Notes (Signed)
Today, Tonya DavenportSandra exercised at [facility]. Service time was from 1030 to 1215.  The patient exercised for more than 31 minutes performing aerobic, strengthening, and stretching exercises. Oxygen saturation, heart rate, blood pressure, rate of perceived exertion, and shortness of breath were all monitored before, during, and after exercise. Tonya DavenportSandra presented with no problems at today's exercise session.   There was no workload change during today's exercise session.  Pre-exercise vitals: . Weight kg: 50.4 . Liters of O2: 3L . SpO2: 92 . HR: 80 . BP: 116/60 . CBG: na  Exercise vitals: . Highest heartrate:  89 . Lowest oxygen saturation: 91 . Highest blood pressure: 120/56 . Liters of 02: 3L  Post-exercise vitals: . SpO2: 97 . HR: 75 . BP: 110/56 . Liters of O2: 3L . CBG: na  Dr. Kalman ShanMurali Ramaswamy, Medical Director Dr. Vanessa BarbaraZamora is immediately available during today's Pulmonary Rehab session for Tonya [name] on 07/19/2014 at 1030 class time.

## 2014-07-21 ENCOUNTER — Encounter (HOSPITAL_COMMUNITY): Payer: Medicare Other

## 2014-07-21 ENCOUNTER — Encounter: Payer: Self-pay | Admitting: Adult Health

## 2014-07-21 ENCOUNTER — Ambulatory Visit (INDEPENDENT_AMBULATORY_CARE_PROVIDER_SITE_OTHER): Payer: Medicare Other | Admitting: Adult Health

## 2014-07-21 ENCOUNTER — Telehealth (HOSPITAL_COMMUNITY): Payer: Self-pay | Admitting: Family Medicine

## 2014-07-21 VITALS — BP 106/58 | HR 80 | Temp 98.0°F | Ht 61.75 in | Wt 111.4 lb

## 2014-07-21 DIAGNOSIS — J471 Bronchiectasis with (acute) exacerbation: Secondary | ICD-10-CM

## 2014-07-21 MED ORDER — PREDNISONE 10 MG PO TABS: ORAL_TABLET | ORAL | Status: DC

## 2014-07-21 MED ORDER — LEVALBUTEROL HCL 0.63 MG/3ML IN NEBU
0.6300 mg | INHALATION_SOLUTION | Freq: Once | RESPIRATORY_TRACT | Status: AC
  Administered 2014-07-21: 0.63 mg via RESPIRATORY_TRACT

## 2014-07-21 NOTE — Addendum Note (Signed)
Addended by: Boone MasterJONES, Jetaun Colbath E on: 07/21/2014 11:37 AM   Modules accepted: Orders

## 2014-07-21 NOTE — Patient Instructions (Addendum)
Finish Zpack .  Mucinex DM Twice daily  As needed  Congestion .  Prednisone taper over next week.  Need to wear oxygen at 3l/m with activity and 2l/m At bedtime  -continuous flow only.  We are setting you up for a CT chest -HRCT to evaluate your Bronchiectasis .  Follow up Dr. Sherene SiresWert  In 2 weeks and As needed

## 2014-07-21 NOTE — Progress Notes (Signed)
Subjective:    Patient ID: Tonya [name], female    DOB: 1946-04-25   MRN: [id number]  Brief patient profile:  [age] yowf never smoker with documented bronchiectasis with chronic cough with all of her symptoms improved on Symbicort 80/4.5 two puffs b.i.d.    History of Present Illness  03/18/2013 f/u ov/Wert re bronchiectassis/ "copd" Chief Complaint  Patient presents with  . Follow-up    Pt states overall doing well, she c/o PND and runny nose for the past month.   has not tried zyrtec  No nasty mucus No sob or need for saba on symbicort 160 2bid rec Prn zyrtec     12/24/2013 f/u ov/Wert re:  Chief Complaint  Patient presents with  . Follow-up    c/o SOB with exertion, prod cough with green to clear mucous.  Increased dizziness when bending down after switching BP meds.   overall much better in terms of resp symptoms but light headed if gets up too quickly sometimes since change to valsartan. Not limited by breathing from desired activities  / rare need for saba  rec For nasty mucus > Zpak as needed  Ok to change back to losartan to see if your dizziness gets better and if so continue it unless the cough/ breathing get worse but let Dr Tenny Crawoss make the final call on what's the best option for your blood pressure (there are many many alternatives that should work) Facilities managerrevnar given     03/24/2014 f/u ov/Wert re: bronchiectasis with  ? Component uacs  Chief Complaint  Patient presents with  . Follow-up    Pt states that cough has been increased--using Mucinex for increased mucus. Pt states that anxiety levels have worsened some and she notices this contributes to her breathing.   freq urge to have bm ? From PPI? No increase in purulent sputum but worse cough when flutter valve broke and ordering a new one on line  Only sob when over does it like long walks or lifting  rec Try off pantoprazole and take an extra pepcid 20 mg after bfast to see if makes a difference with your urge  to have bm Try using xopenex before activity where you know you will be short of breath   04/13/2014 f/u ov/Wert re: bronchiectasis/ refractory Chief Complaint  Patient presents with  . Acute Visit    Pt states cough started to worsen after last visit 03/24/14. She has taken zpack. Cough is prod with yellow sputum.   did not change am pantoprozole but the cough is worse in pm before taking pepcid at hs. Mucus still yellowish>  Less dark p zpak rx she uses prn  Not limited by breathing from desired activities   >>no changes     06/27/2014 Follow up and Med   re: bronchiectasis/ refractory  Patient returns for a follow-up and medication review  We reviewed all her medications organize them into a medication count with patient education. Cough and congestion are better, Mucinex and Flutter really help On O2 at 2l/m At bedtime  .  In pulmonary rehab , needs O2 with exercise.  Today in office sats drop 86% on RA  O2 at rest >90% .  O2 on 3lm with walking kept O2 sats >90%.  Denies chest pain, orthopnea, edema or hemoptysis or fever.  >>O2 rx   07/21/2014 Acute OV  Presents for an acute office visit.  Complains of increased SOB, decreased sats, tightness, prod cough with green mucus x3 days -  Began zpak yesterday.  Denies f/c/s, n/v/d, hemoptysis, PND, leg swelling Started on O2 1 month ago for activity desats. She is in pulmonary rehab and uses O2 with exercise. Has had known intermittent desats since 10/2013, trying to avoid O2 .  Previous PFT 2013 showed severe airflow obstruction in never smoker  Had significant second hand smoke exposure.  On Symbiocrt.  Previous CT chest showed mild diffuse bronchiectasis.     Current Medications, Allergies, Complete Past Medical History, Past Surgical History, Family History, and Social History were reviewed in [facility] electronic medical record.  ROS  The following are not active complaints unless bolded sore throat, dysphagia, dental  problems, itching, sneezing,  nasal congestion or excess/ purulent secretions, ear ache,   fever, chills, sweats, unintended wt loss, pleuritic or exertional cp, hemoptysis,  orthopnea pnd or leg swelling, presyncope, palpitations, heartburn, abdominal pain, anorexia, nausea, vomiting, diarrhea  or change in bowel or urinary habits, change in stools or urine, dysuria,hematuria,  rash, arthralgias, visual complaints, headache, numbness weakness or ataxia or problems with walking or coordination,  change in mood/affect or memory.          Past Medical History:  Bronchiectasis  - CT chest and sinus September 08, 2009 >> mild diffuse bronchiectasis with mild sphenoid sinusitis  - HFA poor September 05, 2009 > 75 % with coaching January 30, 2010  Hypertension  GERD..................................................................................Marland Kitchen.Edwards  - Pos EGD 05/13/08  Complex med regimen  ---Meds reviewed with pt education and computerized med calendar completed/adjusted October 17, 2009 , 02/18/2011 , 11/16/2012 . 06/27/2014         Objective:   Physical Exam  Amb wf nad  / nad   wt 1 132 October 04, 2008 >    126 January 30, 2010 > 02/12/2012 124 > 05/27/2012  126 > 122 08/26/2012 > 10/15/2012  119>118 11/16/2012 > 118 03/18/2013 > 114 10/29/2013 >113 11/12/2013 > 116 12/24/2013 > 03/24/2014  118 > 04/13/2014 113 >113 06/27/2014 >111 07/21/2014   HEENT mild turbinate edema, non-tender sinus, clear discharge. Oropharynx no thrush or excess pnd or cobblestoning. No JVD or cervical adenopathy. Mild accessory muscle hypertrophy. Trachea midline, nl thryroid. Chest with few scattered rhonchi ,no wheezing. Regular rate and rhythm without murmur gallop or rub or increase P2. No edema Abd: no hsm, nl excursion. Ext warm without cyanosis or clubbing.        cxr 3/271/5  No acute cardiopulmonary disease. Basilar interstitial prominence consistent with interstitial fibrosis noted. Chest is stable from prior exam.   Lab  Results  Component Value Date   PROBNP 81.0 10/29/2013     Recent Labs Lab 10/29/13 1713  NA 141  K 3.8  CL 100  CO2 35*  BUN 10  CREATININE 0.7  GLUCOSE 86    Recent Labs Lab 10/29/13 1713  HGB 15.9*  HCT 48.0*  WBC 11.4*  PLT 233.0       Assessment & Plan:

## 2014-07-21 NOTE — Assessment & Plan Note (Signed)
Flare  Need repeat CT to evaluate if bronchiectasis is worse , ? Fibrosis  Although previous PFT w/ minimal restrictive process  More of severe chronic airflow obstruction ? Asthma process  Plan  Finish Zpack .  Mucinex DM Twice daily  As needed  Congestion .  Prednisone taper over next week.  Need to wear oxygen at 3l/m with activity and 2l/m At bedtime  -continuous flow only.  We are setting you up for a CT chest -HRCT to evaluate your Bronchiectasis .  Follow up Dr. Sherene SiresWert  In 2 weeks and As needed

## 2014-07-22 ENCOUNTER — Ambulatory Visit (INDEPENDENT_AMBULATORY_CARE_PROVIDER_SITE_OTHER)
Admission: RE | Admit: 2014-07-22 | Discharge: 2014-07-22 | Disposition: A | Discharge status: Home / Self Care | Payer: Medicare Other | Source: Ambulatory Visit | Attending: Adult Health | Admitting: Adult Health

## 2014-07-22 DIAGNOSIS — J471 Bronchiectasis with (acute) exacerbation: Secondary | ICD-10-CM

## 2014-07-22 NOTE — Progress Notes (Signed)
Office notes, cxr and ov note reviewed and agree with a/p > CT chest next step

## 2014-07-26 ENCOUNTER — Telehealth (HOSPITAL_COMMUNITY): Payer: Self-pay | Admitting: Family Medicine

## 2014-07-26 ENCOUNTER — Encounter (HOSPITAL_COMMUNITY): Payer: Medicare Other

## 2014-07-26 NOTE — Progress Notes (Signed)
Quick Note:  Called spoke with patient, advised of CT results / recs as stated by TP. Pt verbalized her understanding and denied any questions. She will keep the 08/04/14 ov w/ MW and is aware of labs on day of next appt. CT sent to PCP via biscom. ______

## 2014-07-26 NOTE — Psychosocial Assessment (Signed)
Tonya [name] 68 y.o. female  120 day Psychosocial Note  Patient psychosocial assessment reveals no real barriers to participation in Pulmonary Rehab. She has attended every exercise session in the last 30 days with the exception of this week. She called out both Tuesday and today related to exhaustion. She had stated at her last visit that her husband has been requiring lots of care during the night hours. He has been diagnosed with Parkinson's disease and is unable to toilet by himself during the night. The fluxuation in weather has also played a part in her stamina over the last week. Patient does continue to exhibit positive coping skills to deal with her psychosocial concerns. Offered emotional support and reassurance. Patient does feel she is making progress toward Pulmonary Rehab goals. Patient reports her health and activity level has improved in the past 30 days as evidenced by patient's report of increased ability to do more household chores and shopping. Patient states family/friends have noticed changes in her activity or mood. Patient reports  feeling positive about current and projected progression in Pulmonary Rehab. After reviewing the patient's treatment plan, the patient is making progress toward Pulmonary Rehab goals. Patient's rate of progress toward rehab goals is good. Plan of action to help patient continue to work towards rehab goals include increasing workloads as appropriate. Will continue to monitor and evaluate progress toward psychosocial goal(s).  Goal(s) in progress: Continue increasing her strength and endurance  Help patient work toward returning to meaningful activities that improve patient's QOL and are attainable with patient's lung disease

## 2014-07-28 ENCOUNTER — Encounter (HOSPITAL_COMMUNITY): Payer: Medicare Other

## 2014-08-02 ENCOUNTER — Telehealth (HOSPITAL_COMMUNITY): Payer: Self-pay

## 2014-08-02 ENCOUNTER — Encounter (HOSPITAL_COMMUNITY): Payer: Medicare Other

## 2014-08-02 NOTE — Telephone Encounter (Signed)
Called patient regarding her missed appointments in pulmonary rehab. Patient had originally requested to graduate prior to Dec 31 r/t insurance coverage. She has missed her last 3 exercise appointments. Patient stated she is having a lot of holiday stress and her bronchiectasis has flared up. She also stated she has now been put on oxygen continuously and that has taken a toll on her mentally. Attempted to encourage patient. She stated she would possibly attend Thursdays class in order to complete her post program walk test. If she is a no show, she agrees to discharge.

## 2014-08-04 ENCOUNTER — Ambulatory Visit (INDEPENDENT_AMBULATORY_CARE_PROVIDER_SITE_OTHER): Payer: Medicare Other | Admitting: Internal Medicine

## 2014-08-04 ENCOUNTER — Encounter: Payer: Self-pay | Admitting: Internal Medicine

## 2014-08-04 ENCOUNTER — Other Ambulatory Visit (INDEPENDENT_AMBULATORY_CARE_PROVIDER_SITE_OTHER): Payer: Medicare Other

## 2014-08-04 ENCOUNTER — Encounter (HOSPITAL_COMMUNITY)
Admission: RE | Admit: 2014-08-04 | Discharge: 2014-08-04 | Disposition: A | Discharge status: Home / Self Care | Payer: Medicare Other | Source: Ambulatory Visit | Attending: Internal Medicine | Admitting: Internal Medicine

## 2014-08-04 VITALS — BP 112/60 | HR 70 | Ht 61.75 in | Wt 107.0 lb

## 2014-08-04 DIAGNOSIS — J9612 Chronic respiratory failure with hypercapnia: Secondary | ICD-10-CM

## 2014-08-04 DIAGNOSIS — J479 Bronchiectasis, uncomplicated: Secondary | ICD-10-CM

## 2014-08-04 LAB — CBC WITH DIFFERENTIAL/PLATELET
Basophils Absolute: 0.1 K/uL (ref 0.0–0.1)
Basophils Relative: 0.5 % (ref 0.0–3.0)
Eosinophils Absolute: 0.3 K/uL (ref 0.0–0.7)
Eosinophils Relative: 3.1 % (ref 0.0–5.0)
HCT: 46.8 % — ABNORMAL HIGH (ref 36.0–46.0)
Hemoglobin: 15.4 g/dL — ABNORMAL HIGH (ref 12.0–15.0)
Lymphocytes Relative: 18 % (ref 12.0–46.0)
Lymphs Abs: 2 K/uL (ref 0.7–4.0)
MCHC: 32.9 g/dL (ref 30.0–36.0)
MCV: 89.2 fl (ref 78.0–100.0)
Monocytes Absolute: 1 K/uL (ref 0.1–1.0)
Monocytes Relative: 8.9 % (ref 3.0–12.0)
Neutro Abs: 7.7 K/uL (ref 1.4–7.7)
Neutrophils Relative %: 69.5 % (ref 43.0–77.0)
Platelets: 230 K/uL (ref 150.0–400.0)
RBC: 5.24 Mil/uL — ABNORMAL HIGH (ref 3.87–5.11)
RDW: 13.2 % (ref 11.5–15.5)
WBC: 11.1 K/uL — ABNORMAL HIGH (ref 4.0–10.5)

## 2014-08-04 NOTE — Progress Notes (Signed)
Tonya [name] completed a Six-Minute Walk Test on 08/04/14 . Tonya [name] walked 1,065 feet with 0 breaks.  The patient's lowest oxygen saturation was 95% , highest heart rate was 96 , and highest blood pressure was 120/58. The patient was on 3 liters of oxygen with a nasal cannula. Tonya [name] stated that nothing hindered her walk test.

## 2014-08-04 NOTE — Progress Notes (Signed)
Subjective:    Patient ID: Tonya MacadamiaSandra H [name], female    DOB: 18-Nov-1945   MRN: [id number]  Brief patient profile:  [age] yowf never smoker with documented bronchiectasis with chronic cough with all of her symptoms improved on Symbicort 80/4.5 two puffs b.i.d.    History of Present Illness    06/27/2014 Follow up and Med   re: bronchiectasis/ refractory  Patient returns for a follow-up and medication review  We reviewed all her medications organize them into a medication count with patient education. Cough and congestion are better, Mucinex and Flutter really help On O2 at 2l/m At bedtime  In pulmonary rehab , needs O2 with exercise.  Today in office sats drop 86% on RA  O2 at rest >90% .  O2 on 3lm with walking kept O2 sats >90%.  Denies chest pain, orthopnea, edema or hemoptysis or fever.  >>O2 rx   07/21/2014 Acute OV  Presents for an acute office visit.  Complains of increased SOB, decreased sats, tightness, prod cough with green mucus x3 days -   Began zpak yesterday.  Denies f/c/s, n/v/d, hemoptysis, PND, leg swelling Started on O2 1 month ago for activity desats. She is in pulmonary rehab and uses O2 with exercise. Has had known intermittent desats since 10/2013, trying to avoid O2 .  Previous PFT 2013 showed severe airflow obstruction in never smoker  Had significant second hand smoke exposure.  On Symbiocrt.  Previous CT chest showed mild diffuse bronchiectasis.    08/04/2014 f/u ov/Lanette Ell re: bronchiectasis on symbicort 16 2bid / prn xopenex Chief Complaint  Patient presents with  . Follow-up    Pt states that her breathing has improved since her last visit. Her cough is better also, now mainly non prod, but occ produces minimal clear sputum.   on 02 3lpm with walking before exac  2 weeks prior to OV   >  Did fine x 15 min on bicycle at rehab and "graduating today"   No obvious day to day or daytime variabilty or assoc cp or chest tightness, subjective wheeze overt sinus  or hb symptoms. No unusual exp hx or h/o childhood pna/ asthma or knowledge of premature birth.  Sleeping ok without nocturnal  or early am exacerbation  of respiratory  c/o's or need for noct saba. Also denies any obvious fluctuation of symptoms with weather or environmental changes or other aggravating or alleviating factors except as outlined above   Current Medications, Allergies, Complete Past Medical History, Past Surgical History, Family History, and Social History were reviewed in [facility] electronic medical record.  ROS  The following are not active complaints unless bolded sore throat, dysphagia, dental problems, itching, sneezing,  nasal congestion or excess/ purulent secretions, ear ache,   fever, chills, sweats, unintended wt loss, pleuritic or exertional cp, hemoptysis,  orthopnea pnd or leg swelling, presyncope, palpitations, heartburn, abdominal pain, anorexia, nausea, vomiting, diarrhea  or change in bowel or urinary habits, change in stools or urine, dysuria,hematuria,  rash, arthralgias, visual complaints, headache, numbness weakness or ataxia or problems with walking or coordination,  change in mood/affect or memory.                    Past Medical History:  Bronchiectasis  - CT chest and sinus September 08, 2009 >> mild diffuse bronchiectasis with mild sphenoid sinusitis  - HFA poor September 05, 2009 > 75 % with coaching January 30, 2010  Hypertension  GERD..................................................................................Marland Kitchen.Edwards  - Pos EGD 05/13/08  Complex med regimen  ---Meds reviewed with pt education and computerized med calendar completed/adjusted October 17, 2009 , 02/18/2011 , 11/16/2012 . 06/27/2014         Objective:   Physical Exam  Amb wf nad  / nad   wt 1 132 October 04, 2008 >    126 January 30, 2010 > 02/12/2012 124 > 05/27/2012  126 > 122 08/26/2012 > 10/15/2012  119>118 11/16/2012 > 118 03/18/2013 > 114 10/29/2013 >113 11/12/2013 > 116 12/24/2013 >  03/24/2014  118 > 04/13/2014 113 >113 06/27/2014 >111 07/21/2014 > 08/04/14  107  HEENT mild turbinate edema, non-tender sinus, clear discharge. Oropharynx no thrush or excess pnd or cobblestoning. No JVD or cervical adenopathy. Mild accessory muscle hypertrophy. Trachea midline, nl thryroid. Chest with few scattered rhonchi ,no wheezing. Regular rate and rhythm without murmur gallop or rub or increase P2. No edema Abd: no hsm, nl excursion. Ext warm without cyanosis or clubbing.         07/22/14 HRCT chest  1. Widespread central predominant cylindrical and varicose bronchiectasis. 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 2. Scattered areas of peribronchovascular ground-glass attenuation, in addition to micronodularity and micronodularity, most of which likely reflects mucoid impaction within terminal bronchioles. 3. However, there is a peripheral area of airspace consolidation in the left lower lobe, concerning for acute infection. 4. Extensive air trapping, indicative of small airways disease.   Lab Results  Component Value Date   PROBNP 81.0 10/29/2013     Recent Labs Lab 10/29/13 1713  NA 141  K 3.8  CL 100  CO2 35*  BUN 10  CREATININE 0.7  GLUCOSE 86    Recent Labs Lab 10/29/13 1713  HGB 15.9*  HCT 48.0*  WBC 11.4*  PLT 233.0       Assessment & Plan:

## 2014-08-04 NOTE — Patient Instructions (Addendum)
Please see patient coordinator before you leave today  to schedule POC evaluation at home  [facility] to [facility] off 02 at rest and use 3lpm with activity (until you get rechecked) and 2 lpm at bedtime   To get the most out of exercise, you need to be continuously aware that you are short of breath, but never out of breath, for 30 minutes daily. As you improve, it will actually be easier for you to do the same amount of exercise  in  30 minutes so always push to the level where you are short of breath.     Please remember to go to the lab   department downstairs for your tests - we will call you with the results when they are available.     Please schedule a follow up office visit in 6 weeks, call sooner if needed

## 2014-08-05 LAB — ALLERGY FULL PROFILE
ALLERGEN, D PTERNOYSSINUS, D1: 0.14 kU/L — AB
ASPERGILLUS FUMIGATUS M3: 0.24 kU/L — AB
Allergen,Goose feathers, e70: <0.1 kU/L
Bahia Grass: <0.1 kU/L
Bermuda Grass: 0.1 kU/L — ABNORMAL HIGH
Box Elder IgE: <0.1 kU/L
Candida Albicans: 0.38 kU/L — ABNORMAL HIGH
Common Ragweed: <0.1 kU/L
Curvularia lunata: <0.1 kU/L
D. FARINAE: 0.11 kU/L — AB
Elm IgE: <0.1 kU/L
G005 Rye, Perennial: <0.1 kU/L
G009 Red Top: <0.1 kU/L
Helminthosporium halodes: <0.1 kU/L
House Dust Hollister: <0.1 kU/L
IgE (Immunoglobulin E), Serum: 83 kU/L (ref <115)
Oak: <0.1 kU/L
Stemphylium Botryosum: <0.1 kU/L
Sycamore Tree: <0.1 kU/L
Timothy Grass: <0.1 kU/L

## 2014-08-05 NOTE — Assessment & Plan Note (Signed)
-   CT chest and sinus September 08, 2009 >> mild diffuse bronchiectasis with mild sphenoid sinusitis  -  HFA 90% 05/20/2011    - PFT's 05/27/2012 FEV1  0.67 (37%)  Ratio 36 and no better p B2 DLCO 71  - Spiriva started 05/27/2012  > d/c 10/15/2012   - Alpha One AT 05/27/12 > 135  Phenotype: MM   -med calendar 11/16/2012 , 06/27/2014  - referred to rehab 03/24/14 > completed 08/04/14   I had an extended discussion with the patient and daughter reviewing all relevant studies completed to date and  lasting 15 to 20 minutes of a 25 minute visit on the following ongoing concerns:   There is no evidence clinically to support abpa but 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 This was done in the context of a medication calendar review which provided the patient with a user-friendly unambiguous mechanism for medication administration and reconciliation and provides an action plan for all active problems. It is critical that this be shown to every doctor  for modification during the office visit if necessary so the patient can use it as a working document.

## 2014-08-05 NOTE — Assessment & Plan Note (Addendum)
-   10/29/13  HCO3 35 >> Walked RA x one lap @ 185 stopped due to  desat to 82%   - ono RA 11/03/13 > 02 sat < 89% x 7h 54 m >  11/15/2013 rec 2lpm and repeat ono on 2lpm > no desats 11/16/13 on 2lpm  - 08/04/2014  Re-ordered POC @ 3lpm per rehab for sats with goal  > 90% with ex   Her sats are fine at rest but she is not having an exacerbation at present but when she is actively excerbating should not be exercising an ok to use 02 prin at rest.  Should now be able to use poc if qualifies > reordered

## 2014-08-09 ENCOUNTER — Encounter (HOSPITAL_COMMUNITY): Payer: Medicare Other

## 2014-08-11 ENCOUNTER — Encounter (HOSPITAL_COMMUNITY): Payer: Medicare Other

## 2014-08-12 ENCOUNTER — Encounter (HOSPITAL_COMMUNITY): Payer: Self-pay

## 2014-08-12 NOTE — Outcomes Assessment (Signed)
The . [facility] Pulmonary Rehabilitation Final/Discharge Outcome Results  Anthropometrics: . Height (inches): 61.5 . Weight (kg): 50.4 ? This is a change of -0.7kg from entrance.  Functional Status/Exercise Capacity: . Tonya [name] had a resting heart rate of 76 BPM, a resting blood pressure of 114/56, and an oxygen saturation of 96% % on 0 liters of O2.  Tonya [name] performed a discharge 6-minute walk test on 08/04/14.  The patient completed 1,065 feet in 6 minutes with 0 rest breaks.  This quantifies 2.53 METS. The patient's goal is to add 82 feet onto the baseline 6MWT.   ? The patient increased their 6-minute walk test distance by +172 feet and their MET level by +0.23 METs.  Dyspnea Measures: . The [facility] is a simple and standardized method of classifying disability in patients with COPD.  The assessment correlates disability and dyspnea.  Upon discharge the patients resting score was 2. The scale is provided below.  ? This is no change from entrance.  0= I only get breathless with strenuous exercise. 1= I get short of breath when hurrying on level ground or walking up a slight incline. 2= On level ground, I walk slower than people of the same age because of breathlessness, or have to stop for breath when walking at my own pace. 3= I stop for breath after walking 100 yards or after a few minutes on level ground. 4=I am too breathless to leave the house or I am breathless when dressing.   . The patient completed the [place] ([facility]).  This questionnaire relates activities of daily living and shortness of breath.  The score ranges from 0-120, a higher score relates to severe shortness of breath during activities of daily living. The patient's score at discharge was 38. ? This is a change of -16 from entrance.  Quality of Life: . Ferrans and Powers Quality of Life Index Pulmonary Version is used to assess the patients  satisfaction in different domains of their life; health and functioning, socioeconomic, psychological/spiritual, and family. The overall score is recorded out of 30 points.  The patient's goal is to achieve an overall score of 21 or higher.  Tonya [name] received a 27.82 upon discharge.  ? This is a change of +2.51 from entrance.  . The Patient Health Questionnaire (PHQ-2) is a first step approach for the screening of depression.  If the patient scores positive on the PHQ-2 the patient should be further assessed with the PHQ-9.  The Patient Health Questionnaire (PHQ-9) assesses the degree of depression.  Depression is important to monitor and track in pulmonary patients due to its prevalence in the population.  If the patient advances to the PHQ-9 the goal is to score less than 4 on this assessment.  Tonya [name] scored a 0 at discharge. ? This is a no change from entrance.   Clinical Assessment Tools: . The COPD Assessment Test (CAT) is a measurement tool to quantify how much of an impact the disease has on the patient's life.  This assessment aids the Pulmonary Rehab Team in designing the patients individualized treatment plan.  A CAT score ranges from 0-40.  A score of 10 or below indicates that COPD has a low impact on the patient's life whereas a score of 30 or higher indicates a severe impact. The patient's goal is a decrease of 1 point from entrance to discharge.  Tonya [name] did not complete the CAT upon discharge.  Nutrition: .  The "Rate My Plate" is a dietary assessment that quantifies the balance of a patient's diet.  This tool allows the Pulmonary Rehab Team to key in on the areas of the patient's diet that needs improving.  The team can then focus their nutritional education on those areas.  If the patient scores 24-40, this means there are many ways they can make their eating habits healthier, 41-57 states that there are some ways they can make their eating habits healthier and a score of 58-72 states that  they are making many healthy choices.  The patient's goal is to achieve a score of 49 or higher on this assessment.  Tonya [name] scored a 69 upon discharge.   ? This is no change from entrance.  Oxygen Compliance: . Patient is currently on room air at rest, 2 liters at night, and 3 liters for exercise.  Tonya [name] is not currently using cpap/bipap at night.  The patient is currently compliant.  The patient states that they do not have barriers that keep them from using their oxygen.   ? This is a no change result from entrance.    Education: . Tonya [name] attended more than 75% of education classes.  Tonya [name] completed a discharge educational assessment and achieved a score of 14/14.  ? This is a change of +2 from entrance.   Smoking Cessation:  The patient is not currently smoking.  Exercise: . Tonya [name] was provided with an individualized Home Exercise Prescription (HEP) at the entrance of the program.  The patient's goal is to be exercising 30-60 minutes, 3-5 days per week. Upon discharge the patient is exercising 0 days at home.  This is a change of 0 from entrance.  After graduation from Pulmonary Rehab, Tonya [name] will continue exercising at Home.

## 2014-08-15 NOTE — Progress Notes (Signed)
Pulmonary Rehabilitation Discharge Note: Keaja has been discharged from pulmonary rehabilitation after successful completion of the program. Katharine Look attended 22 exercise/education sessions. She met her personal goals of becoming physically stronger and having the ability to manage her breathing better. She increased her stamina and strength as evidenced by her ability to walk 1,065 feet in 6 min as opposed to 893 in 6 min in the beginning of the program. Davina states she is physically stronger and can do more around the home before she becomes short of breath. Latressa is somewhat disappointed because she has now been prescribed oxygen 24/7 because a recent URI. However she does feel better and able to do more activities with the addition of continuous oxygen. Her daughter accompanied her to her last visit and stated she was buying her mom a Recumbent bike for use during the winter months. Malena verbalized the importance of continuing to exercise.

## 2014-08-15 NOTE — Addendum Note (Signed)
Encounter addended by: Tina GriffithsPortia English Ellagrace Yoshida, RN on: 08/15/2014  2:18 PM<BR>     Documentation filed: Notes Section

## 2014-08-16 ENCOUNTER — Encounter (HOSPITAL_COMMUNITY): Payer: Medicare Other

## 2014-09-15 ENCOUNTER — Ambulatory Visit: Payer: Medicare Other | Admitting: Internal Medicine

## 2014-09-23 ENCOUNTER — Ambulatory Visit (INDEPENDENT_AMBULATORY_CARE_PROVIDER_SITE_OTHER): Payer: Medicare Other | Admitting: Internal Medicine

## 2014-09-23 ENCOUNTER — Encounter: Payer: Self-pay | Admitting: Internal Medicine

## 2014-09-23 VITALS — BP 120/60 | HR 81 | Temp 97.8°F | Ht 61.0 in | Wt 104.0 lb

## 2014-09-23 DIAGNOSIS — J471 Bronchiectasis with (acute) exacerbation: Secondary | ICD-10-CM

## 2014-09-23 DIAGNOSIS — J9612 Chronic respiratory failure with hypercapnia: Secondary | ICD-10-CM

## 2014-09-23 MED ORDER — PREDNISONE 10 MG PO TABS: ORAL_TABLET | ORAL | Status: DC

## 2014-09-23 NOTE — Progress Notes (Signed)
Subjective:    Patient ID: Tonya [name], female    DOB: [birth year]   MRN: [id number]  Brief patient profile:  81 yowf never smoker with documented bronchiectasis with chronic cough with all of her symptoms improved on Symbicort 80/4.5 two puffs b.i.d.    History of Present Illness    06/27/2014 Follow up and Med   re: bronchiectasis/ refractory  Patient returns for a follow-up and medication review  We reviewed all her medications organize them into a medication count with patient education. Cough and congestion are better, Mucinex and Flutter really help On O2 at 2l/m At bedtime  In pulmonary rehab , needs O2 with exercise.  Today in office sats drop 86% on RA  O2 at rest >90% .  O2 on 3lm with walking kept O2 sats >90%.  Denies chest pain, orthopnea, edema or hemoptysis or fever.  >>O2 rx   07/21/2014 Acute OV  Presents for an acute office visit.  Complains of increased SOB, decreased sats, tightness, prod cough with green mucus x3 days -   Began zpak yesterday.  Denies f/c/s, n/v/d, hemoptysis, PND, leg swelling Started on O2 1 month ago for activity desats. She is in pulmonary rehab and uses O2 with exercise. Has had known intermittent desats since 10/2013, trying to avoid O2 .  Previous PFT 2013 showed severe airflow obstruction in never smoker  Had significant second hand smoke exposure.  On Symbiocrt.  Previous CT chest showed mild diffuse bronchiectasis.    08/04/2014 f/u ov/Tonya [name] re: bronchiectasis on symbicort 16 2bid / prn xopenex Chief Complaint  Patient presents with  . Follow-up    Pt states that her breathing has improved since her last visit. Her cough is better also, now mainly non prod, but occ produces minimal clear sputum.   on 02 3lpm with walking before exac  2 weeks prior to OV   >  Did fine x 15 min on bicycle at rehab and "graduating today"  rec Please see patient coordinator before you leave today  to schedule POC evaluation at home [facility] to leave  off 02 at rest and use 3lpm with activity (until you get rechecked) and 2 lpm at bedtime To  get the most out of exercise, you need to be continuously aware that you are short of breath, but never out of breath, for 30 minutes daily. As you improve, it will actually be easier for you to do the same amount of exercise  in  30 minutes so always push to the level where you are short of breath.        09/23/2014 f/u ov/Tonya [name] re: bronchiectasis/ 02 dep / symbicort 160 2bid / rare use of xopenex  Chief Complaint  Patient presents with  . Follow-up    pts breathing unchanged, she gets sob with exhertion even with 02 she still gets sob. Pt has slight sore throat.She feels very tense. Pt has cough with greenish to yellow to clear mucus. She just picked up abx but has not started zpac.  does not have a prn abx on med calendar, not using 3lpm with activity as the instructions say "exercise" which she doesn't do. Sleeps ok    No obvious day to day or daytime variabilty or assoc cp or chest tightness, subjective wheeze overt sinus or hb symptoms. No unusual exp hx or h/o childhood pna/ asthma or knowledge of premature birth.  No  nocturnal  or early am exacerbation  of respiratory  c/o's or need for  noct saba. Also denies any obvious fluctuation of symptoms with weather or environmental changes or other aggravating or alleviating factors except as outlined above   Current Medications, Allergies, Complete Past Medical History, Past Surgical History, Family History, and Social History were reviewed in [facility] electronic medical record.  ROS  The following are not active complaints unless bolded sore throat, dysphagia, dental problems, itching, sneezing,  nasal congestion or excess/ purulent secretions, ear ache,   fever, chills, sweats, unintended wt loss, pleuritic or exertional cp, hemoptysis,  orthopnea pnd or leg swelling, presyncope, palpitations, heartburn, abdominal pain, anorexia, nausea,  vomiting, diarrhea  or change in bowel or urinary habits, change in stools or urine, dysuria,hematuria,  rash, arthralgias, visual complaints, headache, numbness weakness or ataxia or problems with walking or coordination,  change in mood/affect or memory.          Past Medical History:  Bronchiectasis  - CT chest and sinus September 08, 2009 >> mild diffuse bronchiectasis with mild sphenoid sinusitis  - HFA poor September 05, 2009 > 75 % with coaching January 30, 2010  Hypertension  GERD..................................................................................Marland Kitchen.Tonya [name]  - Pos EGD 05/13/08  Complex med regimen  ---Meds reviewed with pt education and computerized med calendar completed/adjusted October 17, 2009 , 02/18/2011 , 11/16/2012 . 06/27/2014         Objective:   Physical Exam  Amb wf nad  / nad but somewhat of a hopeless affect today   wt 1 132 October 04, 2008 >    126 January 30, 2010 > 02/12/2012 124 > 05/27/2012  126 > 122 08/26/2012 > 10/15/2012  119>118 11/16/2012 > 118 03/18/2013 > 114 10/29/2013 >113 11/12/2013 > 116 12/24/2013 > 03/24/2014  118 > 04/13/2014 113 >113 06/27/2014 >111 07/21/2014 > 08/04/14  107>  09/23/2014 104   HEENT mild turbinate edema, non-tender sinus, clear discharge. Oropharynx no thrush or excess pnd or cobblestoning. No JVD or cervical adenopathy. Mild accessory muscle hypertrophy. Trachea midline, nl thryroid. Chest with few scattered rhonchi ,no wheezing. Regular rate and rhythm without murmur gallop or rub or increase P2. No edema Abd: no hsm, nl excursion. Ext warm without cyanosis or clubbing.         07/22/14 HRCT chest  1. Widespread central predominant cylindrical and varicose bronchiectasis. 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 2. Scattered areas of peribronchovascular ground-glass attenuation, in addition to micronodularity and micronodularity, most of which likely reflects mucoid impaction within  terminal bronchioles. 3. However, there is a peripheral area of airspace consolidation in the left lower lobe, concerning for acute infection. 4. Extensive air trapping, indicative of small airways disease.   Lab Results  Component Value Date   PROBNP 81.0 10/29/2013     Recent Labs Lab 10/29/13 1713  NA 141  K 3.8  CL 100  CO2 35*  BUN 10  CREATININE 0.7  GLUCOSE 86    Recent Labs Lab 10/29/13 1713  HGB 15.9*  HCT 48.0*  WBC 11.4*  PLT 233.0       Assessment & Plan:

## 2014-09-23 NOTE — Patient Instructions (Addendum)
Increase 02 to 3lpm with activity or whatever you need to maintain 02 sats over 90% if your are monitoring it with a pulse oximeter  Zpak Prednisone 10 mg take  4 each am x 2 days,   2 each am x 2 days,  1 each am x 2 days and stop   Work on inhaler technique:  relax and gently blow all the way out then take a nice smooth deep breath back in, triggering the inhaler at same time you start breathing in.  Hold for up to 5 seconds if you can.  Rinse and gargle with water when done    See calendar for specific medication instructions and bring it back for each and every office visit for every healthcare provider you see.  Without it,  you may not receive the best quality medical care that we feel you deserve.  You will note that the calendar groups together  your maintenance  medications that are timed at particular times of the day.  Think of this as your checklist for what your doctor has instructed you to do until your next evaluation to see what benefit  there is  to staying on a consistent group of medications intended to keep you well.  The other group at the bottom is entirely up to you to use as you see fit  for specific symptoms that may arise between visits that require you to treat them on an as needed basis.  Think of this as your action plan or "what if" list.   Separating the top medications from the bottom group is fundamental to providing you adequate care going forward.    Please schedule a follow up visit in 3 months but call sooner if needed

## 2014-09-24 ENCOUNTER — Encounter: Payer: Self-pay | Admitting: Internal Medicine

## 2014-09-24 NOTE — Assessment & Plan Note (Signed)
-   CT chest and sinus September 08, 2009 >> mild diffuse bronchiectasis with mild sphenoid sinusitis  - PFT's 05/27/2012 FEV1  0.67 (37%)  Ratio 36 and no better p B2 DLCO 71  - Spiriva started 05/27/2012  > d/c 10/15/2012   - Alpha One AT 05/27/12 > 135  Phenotype: MM   -med calendar 11/16/2012 , 06/27/2014  - referred to rehab 03/24/14  -CT chest/HRCT  07/22/14 >>Widespread central predominant cylindrical and varicose bronchiectasis. 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 2. Scattered areas of peribronchovascular ground-glass attenuation, in addition to micronodularity and micronodularity, most of whichlikely reflects mucoid impaction within terminal bronchioles. However, there is a peripheral area of airspace consolidation inthe left lower lobe, concerning for acute infection.Marland Kitchen. Extensive air trapping, indicative of small airways disease. High-resolution imagesdemonstrate no significant subpleural reticulation, parenchymalbanding or honeycombing to suggest interstitial lung disease. - Allergy screen 08/04/2014 >  Eos 3.1 % IgE  83 mild Pos aspergillus       The proper method of use, as well as anticipated side effects, of a metered-dose inhaler are discussed and demonstrated to the patient. Improved effectiveness after extensive coaching during this visit to a level of approximately  75%   I had an extended discussion with the patient reviewing all relevant studies completed to date and  lasting 15 to 20 minutes of a 25 minute visit on the following ongoing concerns:   1) she is having a mild flare with discolored sputum so will try zpak and if helpful consider 500 tiw as a preventive, saving the fq's for more severe flares  2) Each maintenance medication was reviewed in detail including most importantly the difference between maintenance and as needed and under what circumstances the prns are to be used. This was done in the context of a  medication calendar review which provided the patient with a user-friendly unambiguous mechanism for medication administration and reconciliation and provides an action plan for all active problems. It is critical that this be shown to every doctor  for modification during the office visit if necessary so the patient can use it as a working document.

## 2014-09-24 NOTE — Assessment & Plan Note (Signed)
-   10/29/13  HCO3 35   Walked RA x one lap @ 185 stopped due to  desat to 82% with HCO3 35 c/w hypercarbia - ono RA 11/03/13 > 02 sat < 89% x 7h 54 m >  11/15/2013 rec 2lpm and repeat ono on 2lpm > no desats 11/16/13  - 08/04/2014  Re-ordered POC @ 3lpm per rehab for sats > 90% with ex  - 09/23/2014  Walked 3lpm POC x 3 laps @ 185 ft each stopped due to  Leg fatigue, no sob or desats  No change rx needed:  2lpm 24/7 but increase to 3lpm with activity or sat > 90% if monitoring by pulse ox

## 2014-11-02 ENCOUNTER — Telehealth: Payer: Self-pay | Admitting: Internal Medicine

## 2014-11-02 NOTE — Telephone Encounter (Signed)
We do not have samples of Symbicort at this time. Pt is aware. Nothing further was needed.

## 2014-11-25 ENCOUNTER — Telehealth: Payer: Self-pay | Admitting: Internal Medicine

## 2014-11-25 MED ORDER — PREDNISONE 10 MG PO TABS: ORAL_TABLET | ORAL | Status: DC

## 2014-11-25 NOTE — Telephone Encounter (Signed)
Prednisone 10 mg take  4 each am x 2 days,   2 each am x 2 days,  1 each am x 2 days and stop  

## 2014-11-25 NOTE — Telephone Encounter (Signed)
Pt seen 09/23/14 by MW Pt is wanting prednisone RX called in.  She c/o prod cough (green-yellow phlem), chest congestion, chest tx. Pt recently finished zpak. Taking mucinex DM. Please advise MW thanks  Allergies  Allergen Reactions  . Alprazolam     REACTION: "I just feel funny"  . Doxycycline     REACTION: rash, redness in face, slight swelling in lips  . Levaquin [Levofloxacin]     REACTION: redness in face, rash, slight swelling in lips  . Metoclopramide Hcl   . Moxifloxacin   . Penicillins   . Sulfonamide Derivatives   . Tramadol     REACTION: made pt "feel funny"

## 2014-11-25 NOTE — Telephone Encounter (Signed)
rx sent to pharmacy Patient notified. Nothing further needed.  

## 2014-12-27 ENCOUNTER — Other Ambulatory Visit: Payer: Self-pay | Admitting: Internal Medicine

## 2014-12-30 ENCOUNTER — Other Ambulatory Visit (INDEPENDENT_AMBULATORY_CARE_PROVIDER_SITE_OTHER): Payer: Medicare Other

## 2014-12-30 ENCOUNTER — Encounter: Payer: Self-pay | Admitting: Internal Medicine

## 2014-12-30 ENCOUNTER — Ambulatory Visit (INDEPENDENT_AMBULATORY_CARE_PROVIDER_SITE_OTHER): Payer: Medicare Other | Admitting: Internal Medicine

## 2014-12-30 ENCOUNTER — Ambulatory Visit (INDEPENDENT_AMBULATORY_CARE_PROVIDER_SITE_OTHER)
Admission: RE | Admit: 2014-12-30 | Discharge: 2014-12-30 | Disposition: A | Discharge status: Home / Self Care | Payer: Medicare Other | Source: Ambulatory Visit | Attending: Internal Medicine | Admitting: Internal Medicine

## 2014-12-30 VITALS — BP 112/62 | HR 72 | Ht 61.75 in | Wt 103.0 lb

## 2014-12-30 DIAGNOSIS — R0600 Dyspnea, unspecified: Secondary | ICD-10-CM | POA: Diagnosis not present

## 2014-12-30 DIAGNOSIS — J479 Bronchiectasis, uncomplicated: Secondary | ICD-10-CM

## 2014-12-30 DIAGNOSIS — J9612 Chronic respiratory failure with hypercapnia: Secondary | ICD-10-CM

## 2014-12-30 LAB — BASIC METABOLIC PANEL
BUN: 13 mg/dL (ref 6–23)
CHLORIDE: 99 meq/L (ref 96–112)
CO2: 36 meq/L — AB (ref 19–32)
Calcium: 9.7 mg/dL (ref 8.4–10.5)
Creatinine, Ser: 0.67 mg/dL (ref 0.40–1.20)
GFR: 92.4 mL/min (ref >60.00)
Glucose, Bld: 94 mg/dL (ref 70–99)
POTASSIUM: 3.7 meq/L (ref 3.5–5.1)
SODIUM: 140 meq/L (ref 135–145)

## 2014-12-30 LAB — CBC WITH DIFFERENTIAL/PLATELET
BASOS ABS: 0.1 10*3/uL (ref 0.0–0.1)
Basophils Relative: 0.6 % (ref 0.0–3.0)
EOS ABS: 0.4 10*3/uL (ref 0.0–0.7)
Eosinophils Relative: 4.3 % (ref 0.0–5.0)
HCT: 46.5 % — ABNORMAL HIGH (ref 36.0–46.0)
Hemoglobin: 15.5 g/dL — ABNORMAL HIGH (ref 12.0–15.0)
Lymphocytes Relative: 20.7 % (ref 12.0–46.0)
Lymphs Abs: 2.1 10*3/uL (ref 0.7–4.0)
MCHC: 33.3 g/dL (ref 30.0–36.0)
MCV: 89.4 fl (ref 78.0–100.0)
Monocytes Absolute: 0.8 10*3/uL (ref 0.1–1.0)
Monocytes Relative: 8.1 % (ref 3.0–12.0)
NEUTROS ABS: 6.6 10*3/uL (ref 1.4–7.7)
Neutrophils Relative %: 66.3 % (ref 43.0–77.0)
PLATELETS: 211 10*3/uL (ref 150.0–400.0)
RBC: 5.2 Mil/uL — AB (ref 3.87–5.11)
RDW: 13.4 % (ref 11.5–15.5)
WBC: 10 10*3/uL (ref 4.0–10.5)

## 2014-12-30 LAB — SEDIMENTATION RATE: Sed Rate: 9 mm/hr (ref 0–22)

## 2014-12-30 LAB — BRAIN NATRIURETIC PEPTIDE: Pro B Natriuretic peptide (BNP): 32 pg/mL (ref 0.0–100.0)

## 2014-12-30 LAB — TSH: TSH: 1.19 u[IU]/mL (ref 0.35–4.50)

## 2014-12-30 NOTE — Progress Notes (Addendum)
Subjective:    Patient ID: Tonya [name], female    DOB: 1946-06-02   MRN: [id number]  Brief patient profile:  43 yowf never smoker with documented bronchiectasis and moderate airflow obst with chronic cough with all of her symptoms improved on Symbicort 80/4.5 two puffs b.i.d.    History of Present Illness    06/27/2014 Follow up and Med   re: bronchiectasis/ refractory  Patient returns for a follow-up and medication review  We reviewed all her medications organize them into a medication count with patient education. Cough and congestion are better, Mucinex and Flutter really help On O2 at 2l/m At bedtime  In pulmonary rehab , needs O2 with exercise.  Today in office sats drop 86% on RA  O2 at rest >90% .  O2 on 3lm with walking kept O2 sats >90%.  Denies chest pain, orthopnea, edema or hemoptysis or fever.  >>O2 rx      09/23/2014 f/u ov/Tonya [name] re: bronchiectasis/ 02 dep / symbicort 160 2bid / rare use of xopenex  Chief Complaint  Patient presents with  . Follow-up    pts breathing unchanged, she gets sob with exhertion even with 02 she still gets sob. Pt has slight sore throat.She feels very tense. Pt has cough with greenish to yellow to clear mucus. She just picked up abx but has not started zpac.  does not have a prn abx on med calendar, not using 3lpm with activity as the instructions say "exercise" which she doesn't do. Sleeps ok  rec Increase 02 to 3lpm with activity or whatever you need to maintain 02 sats over 90% if your are monitoring it with a pulse oximeter Zpak Prednisone 10 mg take  4 each am x 2 days,   2 each am x 2 days,  1 each am x 2 days and stop  Work on inhaler technique        12/30/2014 f/u ov/Tonya [name] re: bronchiectasis Chief Complaint  Patient presents with  . Follow-up    Pt c/o decreased energy levels. Pt states that she only has increased SOB with exertion.     Not so much breathing/ coughing issues but poor energy and only on zoloft 12.5 mg  daily   Struggling to help husband who has parkinson's/ does ok on her own on 3lpm  Not using xopenex   No obvious day to day or daytime variabilty or assoc excess/ purulent sputum or cp or chest tightness, subjective wheeze overt sinus or hb symptoms. No unusual exp hx or h/o childhood pna/ asthma or knowledge of premature birth.  No  nocturnal  or early am exacerbation  of respiratory  c/o's or need for noct saba. Also denies any obvious fluctuation of symptoms with weather or environmental changes or other aggravating or alleviating factors except as outlined above   Current Medications, Allergies, Complete Past Medical History, Past Surgical History, Family History, and Social History were reviewed in [employer] record.  ROS  The following are not active complaints unless bolded sore throat, dysphagia, dental problems, itching, sneezing,  nasal congestion or excess/ purulent secretions, ear ache,   fever, chills, sweats, unintended wt loss, pleuritic or exertional cp, hemoptysis,  orthopnea pnd or leg swelling, presyncope, palpitations, heartburn, abdominal pain, anorexia, nausea, vomiting, diarrhea  or change in bowel or urinary habits, change in stools or urine, dysuria,hematuria,  rash, arthralgias, visual complaints, headache, numbness weakness or ataxia or problems with walking or coordination,  change in mood/affect or memory.  Past Medical History:  Bronchiectasis  - CT chest and sinus September 08, 2009 >> mild diffuse bronchiectasis with mild sphenoid sinusitis  - HFA poor September 05, 2009 > 75 % with coaching January 30, 2010  Hypertension  GERD..................................................................................Marland Kitchen.Edwards  - Pos EGD 05/13/08  Complex med regimen  ---Meds reviewed with pt education and computerized med calendar completed/adjusted October 17, 2009 , 02/18/2011 , 11/16/2012 . 06/27/2014         Objective:   Physical Exam  Amb  wf nad  / nad mod hoarse  wt 1 132 October 04, 2008 >    126 January 30, 2010 > 02/12/2012 124 > 05/27/2012  126 > 122 08/26/2012 > 10/15/2012  119>118 11/16/2012 > 118 03/18/2013 > 114 10/29/2013 >113 11/12/2013 > 116 12/24/2013 > 03/24/2014  118 > 04/13/2014 113 >113 06/27/2014 >111 07/21/2014 > 08/04/14  107>  09/23/2014 104 > 12/30/2014 103   HEENT mild turbinate edema, non-tender sinus, clear discharge. Oropharynx no thrush or excess pnd or cobblestoning. No JVD or cervical adenopathy. Mild accessory muscle hypertrophy. Trachea midline, nl thryroid. Chest with few scattered rhonchi ,no wheezing. Regular rate and rhythm without murmur gallop or rub or increase P2. No edema Abd: no hsm, nl excursion. Ext warm without cyanosis or clubbing.       Labs ordered/ reviewed:    Lab 12/30/14 1122  NA 140  K 3.7  CL 99  CO2 36*  BUN 13  CREATININE 0.67  GLUCOSE 94       Lab 12/30/14 1122  HGB 15.5*  HCT 46.5*  WBC 10.0  PLT 211.0     Lab Results  Component Value Date   TSH 1.19 12/30/2014     Lab Results  Component Value Date   PROBNP 32.0 12/30/2014     Lab Results  Component Value Date   ESRSEDRATE 9 12/30/2014       I personally reviewed images and agree with radiology impression as follows:  CXR:  12/30/14 COPD. There is no pneumonia, CHF, nor other acute cardiopulmonary abnormality.    Assessment & Plan:

## 2014-12-30 NOTE — Patient Instructions (Addendum)
Increase Zoloft to 25 mg per day and see DR Tenny Crawoss in 3 weeks (no sooner)  Please remember to go to the lab and x-ray department downstairs for your tests - we will call you with the results when they are available.  Wear 02 when walking outside of your house at 3 lpm      Please schedule a follow up visit in 3 months but call sooner if needed with all meds for new med calendar

## 2014-12-30 NOTE — Progress Notes (Signed)
Quick Note:  Spoke with pt and notified of results per Dr. Wert. Pt verbalized understanding and denied any questions.  ______ 

## 2014-12-30 NOTE — Progress Notes (Signed)
Quick Note:  Called and spoke with patient. Reviewed results and recs with pt. Pt voiced understanding and had no further questions ______

## 2015-01-01 ENCOUNTER — Encounter: Payer: Self-pay | Admitting: Internal Medicine

## 2015-01-01 NOTE — Assessment & Plan Note (Signed)
-   CT chest and sinus September 08, 2009 >> mild diffuse bronchiectasis with mild sphenoid sinusitis  - PFT's 05/27/2012 FEV1  0.67 (37%)  Ratio 36 and no better p B2 DLCO 71  - Spiriva started 05/27/2012  > d/c 10/15/2012   - Alpha One AT 05/27/12 > 135  Phenotype: MM   -med calendar 11/16/2012 , 06/27/2014  - referred to rehab 03/24/14  -CT chest/HRCT  07/22/14 >>Widespread central predominant cylindrical and varicose bronchiectasis. 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 2. Scattered areas of peribronchovascular ground-glass attenuation, in addition to micronodularity and micronodularity, most of whichlikely reflects mucoid impaction within terminal bronchioles. However, there is a peripheral area of airspace consolidation inthe left lower lobe, concerning for acute infection.Marland Kitchen. Extensive air trapping, indicative of small airways disease. High-resolution imagesdemonstrate no significant subpleural reticulation, parenchymalbanding or honeycombing to suggest interstitial lung disease. - Allergy screen 08/04/2014 >  Eos 3.1 % IgE  83 mild Pos aspergillus     The proper method of use, as well as anticipated side effects, of a metered-dose inhaler are discussed and demonstrated to the patient. Improved effectiveness after extensive coaching during this visit to a level of approximately  90%   I had an extended discussion with the patient reviewing all relevant studies completed to date and  lasting 15 to 20 minutes of a 25 minute visit on the following ongoing concerns:   1) optimal rx on present rx  2)  Each maintenance medication was reviewed in detail including most importantly the difference between maintenance and as needed and under what circumstances the prns are to be used. This was done in the context of a medication calendar review which provided the patient with a user-friendly unambiguous mechanism for medication administration and  reconciliation and provides an action plan for all active problems. It is critical that this be shown to every doctor  for modification during the office visit if necessary so the patient can use it as a working document.

## 2015-01-01 NOTE — Assessment & Plan Note (Signed)
Labs ok/ seems more fatigued than truly sob so rec increase zoloft to 25 mg daily and consider further titration up with f/u with Dr Tenny Crawoss p 3 weks on the 25 mg per day dose  See instructions for specific recommendations which were reviewed directly with the patient who was given a copy with highlighter outlining the key components.

## 2015-01-08 ENCOUNTER — Other Ambulatory Visit: Payer: Self-pay | Admitting: Internal Medicine

## 2015-01-11 ENCOUNTER — Other Ambulatory Visit: Payer: Self-pay | Admitting: Internal Medicine

## 2015-01-11 ENCOUNTER — Telehealth: Payer: Self-pay | Admitting: Internal Medicine

## 2015-01-11 MED ORDER — PREDNISONE 10 MG PO TABS: ORAL_TABLET | ORAL | Status: DC

## 2015-01-11 MED ORDER — AZITHROMYCIN 250 MG PO TABS
ORAL_TABLET | ORAL | Status: DC
Start: 2015-01-11 — End: 2015-03-22

## 2015-01-11 NOTE — Telephone Encounter (Signed)
Spoke with pt and advised of Dr Wert's recommendations.  Rx sent. 

## 2015-01-11 NOTE — Telephone Encounter (Signed)
Spoke with pt, states she needs a z-pak and prednisone.  Pt c/o prod cough with green mucus, some chest tightness X5 days.  Denies fever, sob.   Pt uses [facility] [address]    MW please advise on recs.  Thanks!

## 2015-01-11 NOTE — Telephone Encounter (Signed)
Zpak/ Prednisone 10 mg take  4 each am x 2 days,   2 each am x 2 days,  1 each am x 2 days and stop  

## 2015-01-22 ENCOUNTER — Other Ambulatory Visit: Payer: Self-pay | Admitting: Internal Medicine

## 2015-03-21 ENCOUNTER — Telehealth: Payer: Self-pay | Admitting: Internal Medicine

## 2015-03-21 NOTE — Telephone Encounter (Signed)
Just add on to end of the day

## 2015-03-21 NOTE — Telephone Encounter (Signed)
Called and spoke to pt. Pt c/o increase in SOB with activity, lacking energy, prod cough with yellow mucus, and intermittent chest tightness with activity. Pt denies f/c/s and swelling. Pt requesting pred. Offered pt appt with BQ, pt refused. Pt requesting MW's recs.   Dr. Sherene Sires please advise.

## 2015-03-21 NOTE — Telephone Encounter (Signed)
Spoke with patient-offered this afternoon with MW at 4:15pm; pt is unable to come today due to transportation issues. There was an opening for MW tomorrow at 2:45pm and patient will come in then. Nothing more needed at this time.

## 2015-03-22 ENCOUNTER — Ambulatory Visit (INDEPENDENT_AMBULATORY_CARE_PROVIDER_SITE_OTHER): Payer: Medicare Other | Admitting: Internal Medicine

## 2015-03-22 ENCOUNTER — Encounter: Payer: Self-pay | Admitting: Internal Medicine

## 2015-03-22 VITALS — BP 132/60 | HR 83 | Ht 62.0 in | Wt 102.0 lb

## 2015-03-22 DIAGNOSIS — J479 Bronchiectasis, uncomplicated: Secondary | ICD-10-CM | POA: Diagnosis not present

## 2015-03-22 DIAGNOSIS — J9612 Chronic respiratory failure with hypercapnia: Secondary | ICD-10-CM | POA: Diagnosis not present

## 2015-03-22 MED ORDER — AZITHROMYCIN 250 MG PO TABS: ORAL_TABLET | ORAL | Status: DC

## 2015-03-22 MED ORDER — LEVALBUTEROL TARTRATE 45 MCG/ACT IN AERO: 1.0000 | INHALATION_SPRAY | RESPIRATORY_TRACT | Status: AC | PRN

## 2015-03-22 MED ORDER — BUDESONIDE-FORMOTEROL FUMARATE 160-4.5 MCG/ACT IN AERO: INHALATION_SPRAY | RESPIRATORY_TRACT | Status: DC

## 2015-03-22 MED ORDER — PREDNISONE 10 MG PO TABS: ORAL_TABLET | ORAL | Status: DC

## 2015-03-22 NOTE — Progress Notes (Signed)
Subjective:   Patient ID: Tonya [name], female    DOB: May 17, 1946   MRN: [id number]    Brief patient profile:  89 yowf never smoker with documented bronchiectasis and moderate airflow obst with chronic cough with all of her symptoms improved on Symbicort 80/4.5 two puffs b.i.d.    History of Present Illness  06/27/2014 Follow up and Med   re: bronchiectasis/ refractory  Patient returns for a follow-up and medication review  We reviewed all her medications organize them into a medication count with patient education. Cough and congestion are better, Mucinex and Flutter really help On O2 at 2l/m At bedtime  In pulmonary rehab , needs O2 with exercise.  Today in office sats drop 86% on RA  O2 at rest >90% .  O2 on 3lm with walking kept O2 sats >90%.  Denies chest pain, orthopnea, edema or hemoptysis or fever.  >>O2 rx      09/23/2014 f/u ov/Tonya [name] re: bronchiectasis/ 02 dep / symbicort 160 2bid / rare use of xopenex  Chief Complaint  Patient presents with  . Follow-up    pts breathing unchanged, she gets sob with exhertion even with 02 she still gets sob. Pt has slight sore throat.She feels very tense. Pt has cough with greenish to yellow to clear mucus. She just picked up abx but has not started zpac.  does not have a prn abx on med calendar, not using 3lpm with activity as the instructions say "exercise" which she doesn't do. Sleeps ok  rec Increase 02 to 3lpm with activity or whatever you need to maintain 02 sats over 90% if your are monitoring it with a pulse oximeter Zpak Prednisone 10 mg take  4 each am x 2 days,   2 each am x 2 days,  1 each am x 2 days and stop  Work on inhaler technique        12/30/2014 f/u ov/Tonya [name] re: bronchiectasis Chief Complaint  Patient presents with  . Follow-up    Pt c/o decreased energy levels. Pt states that she only has increased SOB with exertion.     Not so much breathing/ coughing issues but poor energy and only on zoloft 12.5 mg  daily   Struggling to help husband who has parkinson's/ does ok on her own on 3lpm  Not using xopenex  rec Increase Zoloft to 25 mg per day and see DR Tenny Craw in 3 weeks (no sooner) Please remember to go to the lab and x-ray department downstairs for your tests - we will call you with the results when they are available. Wear 02 when walking outside of your house at 3 lpm     03/22/2015 f/u ov/Tonya [name] re: ftt x Jan 2016  Chief Complaint  Patient presents with  . Acute Visit    Pt c/o worsening SOB with exertion-2-3 weeks. Pt also states productive cough with clear ro yellow mucus and chest tightness. Pt denies wheezing, chest pain or recent fever    zoloft ? Lights changed/ blurry viz @ 50 mg per day so stopped it  Onset of sob was insidious, becoming more difficult to use hfa / now sob with more than slow walk = mmrc 2 even on 3lpm    No obvious day to day or daytime variabilty or assoc   cp or chest tightness, subjective wheeze overt sinus or hb symptoms. No unusual exp hx or h/o childhood pna/ asthma or knowledge of premature birth.  No  nocturnal  or early am  exacerbation  of respiratory  c/o's or need for noct saba. Also denies any obvious fluctuation of symptoms with weather or environmental changes or other aggravating or alleviating factors except as outlined above   Current Medications, Allergies, Complete Past Medical History, Past Surgical History, Family History, and Social History were reviewed in [employer] record.  ROS  The following are not active complaints unless bolded sore throat, dysphagia, dental problems, itching, sneezing,  nasal congestion or excess/ purulent secretions, ear ache,   fever, chills, sweats, unintended wt loss, pleuritic or exertional cp, hemoptysis,  orthopnea pnd or leg swelling, presyncope, palpitations, heartburn, abdominal pain, anorexia, nausea, vomiting, diarrhea  or change in bowel or urinary habits, change in stools or urine,  dysuria,hematuria,  rash, arthralgias, visual complaints, headache, numbness weakness or ataxia or problems with walking or coordination,  change in mood/affect or memory.          Past Medical History:  Bronchiectasis  - CT chest and sinus September 08, 2009 >> mild diffuse bronchiectasis with mild sphenoid sinusitis  - HFA poor September 05, 2009 > 75 % with coaching January 30, 2010  Hypertension  GERD..................................................................................Marland KitchenEdwards  - Pos EGD 05/13/08  Complex med regimen  ---Meds reviewed with pt education and computerized med calendar completed/adjusted October 17, 2009 , 02/18/2011 , 11/16/2012 . 06/27/2014         Objective:   Physical Exam  Amb wf nad  / nad mod hoarse with congested sounding cough  wt 1 132 October 04, 2008 >    126 January 30, 2010 > 02/12/2012 124 > 05/27/2012  126 > 122 08/26/2012 > 10/15/2012  119>118 11/16/2012 > 118 03/18/2013 > 114 10/29/2013 >113 11/12/2013 > 116 12/24/2013 > 03/24/2014  118 > 04/13/2014 113 >113 06/27/2014 >111 07/21/2014 > 08/04/14  107>  09/23/2014 104 > 12/30/2014 103 > 03/22/2015 102  HEENT mild turbinate edema, non-tender sinus, clear discharge. Oropharynx no thrush or excess pnd or cobblestoning. No JVD or cervical adenopathy. Mild accessory muscle hypertrophy. Trachea midline, nl thryroid. Chest with bilateral mid exp rhonchi ,no wheezing. Regular rate and rhythm without murmur gallop or rub or increase P2. No edema Abd: no hsm, nl excursion. Ext warm without cyanosis or clubbing.       Labs   reviewed:    Lab 12/30/14 1122  NA 140  K 3.7  CL 99  CO2 36*  BUN 13  CREATININE 0.67  GLUCOSE 94       Lab 12/30/14 1122  HGB 15.5*  HCT 46.5*  WBC 10.0  PLT 211.0     Lab Results  Component Value Date   TSH 1.19 12/30/2014     Lab Results  Component Value Date   PROBNP 32.0 12/30/2014     Lab Results  Component Value Date   ESRSEDRATE 9 12/30/2014       I personally reviewed  images and agree with radiology impression as follows:  CXR:  12/30/14 COPD. There is no pneumonia, CHF, nor other acute cardiopulmonary abnormality.    Assessment & Plan:

## 2015-03-22 NOTE — Patient Instructions (Addendum)
Prednisone 10 mg take  4 each am x 2 days,  2 each am x 2 days,  1 each am x 2 days and stop   Work on inhaler technique:  relax and gently blow all the way out then take a nice smooth deep breath back in, triggering the inhaler at same time you start breathing in.  Hold for up to 5 seconds if you can. Blow out thru nose. Rinse and gargle with water when done  See calendar for specific medication instructions and bring it back for each and every office visit for every healthcare provider you see.  Without it,  you may not receive the best quality medical care that we feel you deserve.  You will note that the calendar groups together  your maintenance  medications that are timed at particular times of the day.  Think of this as your checklist for what your doctor has instructed you to do until your next evaluation to see what benefit  there is  to staying on a consistent group of medications intended to keep you well.  The other group at the bottom is entirely up to you to use as you see fit  for specific symptoms that may arise between visits that require you to treat them on an as needed basis.  Think of this as your action plan or "what if" list.   Separating the top medications from the bottom group is fundamental to providing you adequate care going forward.    Keep appt to see Orvan Seen NP   Late add ? Consider  nebulizer with performist / budesonide also candidate for vest if cough / congestion no better

## 2015-03-25 ENCOUNTER — Encounter: Payer: Self-pay | Admitting: Internal Medicine

## 2015-03-25 NOTE — Assessment & Plan Note (Signed)
-   CT chest and sinus September 08, 2009 >> mild diffuse bronchiectasis with mild sphenoid sinusitis  - PFT's 05/27/2012 FEV1  0.67 (37%)  Ratio 36 and no better p B2 DLCO 71  - Spiriva started 05/27/2012  > d/c 10/15/2012   - Alpha One AT 05/27/12 > 135  Phenotype: MM   -med calendar 11/16/2012 , 06/27/2014  - referred to rehab 03/24/14  -CT chest/HRCT  07/22/14 >>Widespread central predominant cylindrical and varicose bronchiectasis. 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 2. Scattered areas of peribronchovascular ground-glass attenuation, in addition to micronodularity and micronodularity, most of whichlikely reflects mucoid impaction within terminal bronchioles. However, there is a peripheral area of airspace consolidation inthe left lower lobe, concerning for acute infection.Marland Kitchen Extensive air trapping, indicative of small airways disease. High-resolution imagesdemonstrate no significant subpleural reticulation, parenchymalbanding or honeycombing to suggest interstitial lung disease. - Allergy screen 08/04/2014 >  Eos 3.1 % IgE  83 mild Pos aspergillus  - 03/22/2015  p extensive coaching HFA effectiveness =    50%   The proper method of use, as well as anticipated side effects, of a metered-dose inhaler are discussed and demonstrated to the patient. Improved effectiveness after extensive coaching during this visit to a level of approximately  50% from a baseline of < 25% so it may well be she's progressed to the point where IC is limiting ability to use hfa and may need to consider laba/ics neb.  I had an extended discussion with the patient reviewing all relevant studies completed to date and  lasting 15 to 20 minutes of a 25 minute visit      Each maintenance medication was reviewed in detail including most importantly the difference between maintenance and as needed and under what circumstances the prns are to be used. This was done in the context  of a medication calendar review which provided the patient with a user-friendly unambiguous mechanism for medication administration and reconciliation and provides an action plan for all active problems. It is critical that this be shown to every doctor  for modification during the office visit if necessary so the patient can use it as a working document.      See instructions for specific recommendations which were reviewed directly with the patient who was given a copy with highlighter outlining the key components.

## 2015-04-03 ENCOUNTER — Encounter: Payer: Self-pay | Admitting: Adult Health

## 2015-04-03 ENCOUNTER — Ambulatory Visit (INDEPENDENT_AMBULATORY_CARE_PROVIDER_SITE_OTHER): Payer: Medicare Other | Admitting: Adult Health

## 2015-04-03 VITALS — BP 136/62 | HR 83 | Temp 97.8°F | Ht 61.0 in | Wt 101.0 lb

## 2015-04-03 DIAGNOSIS — J9612 Chronic respiratory failure with hypercapnia: Secondary | ICD-10-CM | POA: Diagnosis not present

## 2015-04-03 DIAGNOSIS — J479 Bronchiectasis, uncomplicated: Secondary | ICD-10-CM | POA: Diagnosis not present

## 2015-04-03 MED ORDER — E-Z SPACER DEVI
Status: DC
Start: 2015-04-03 — End: 2015-04-03

## 2015-04-03 MED ORDER — E-Z SPACER DEVI: Status: DC

## 2015-04-03 NOTE — Patient Instructions (Signed)
Continue on current regimen Order sent to begin VEST therapy .  Order sent for walker with seat.  May use spacer with symbicort .  Follow med calendar closely and bring to each visit.  follow up Dr. Sherene Sires  In 3- 4  months and As needed

## 2015-04-03 NOTE — Progress Notes (Signed)
Subjective:   Patient ID: Tonya [name], female    DOB: Nov 24, 1945   MRN: [id number]    Brief patient profile:  43 yowf never smoker with documented bronchiectasis and moderate airflow obst with chronic cough with all of her symptoms improved on Symbicort 80/4.5 two puffs b.i.d.    History of Present Illness  06/27/2014 Follow up and Med   re: bronchiectasis/ refractory  Patient returns for a follow-up and medication review  We reviewed all her medications organize them into a medication count with patient education. Cough and congestion are better, Mucinex and Flutter really help On O2 at 2l/m At bedtime  In pulmonary rehab , needs O2 with exercise.  Today in office sats drop 86% on RA  O2 at rest >90% .  O2 on 3lm with walking kept O2 sats >90%.  Denies chest pain, orthopnea, edema or hemoptysis or fever.  >>O2 rx      09/23/2014 f/u ov/Wert re: bronchiectasis/ 02 dep / symbicort 160 2bid / rare use of xopenex  Chief Complaint  Patient presents with  . Follow-up    pts breathing unchanged, she gets sob with exhertion even with 02 she still gets sob. Pt has slight sore throat.She feels very tense. Pt has cough with greenish to yellow to clear mucus. She just picked up abx but has not started zpac.  does not have a prn abx on med calendar, not using 3lpm with activity as the instructions say "exercise" which she doesn't do. Sleeps ok  rec Increase 02 to 3lpm with activity or whatever you need to maintain 02 sats over 90% if your are monitoring it with a pulse oximeter Zpak Prednisone 10 mg take  4 each am x 2 days,   2 each am x 2 days,  1 each am x 2 days and stop  Work on inhaler technique        12/30/2014 f/u ov/Wert re: bronchiectasis Chief Complaint  Patient presents with  . Follow-up    Pt c/o decreased energy levels. Pt states that she only has increased SOB with exertion.     Not so much breathing/ coughing issues but poor energy and only on zoloft 12.5 mg  daily   Struggling to help husband who has parkinson's/ does ok on her own on 3lpm  Not using xopenex  rec Increase Zoloft to 25 mg per day and see DR Tenny Craw in 3 weeks (no sooner) Please remember to go to the lab and x-ray department downstairs for your tests - we will call you with the results when they are available. Wear 02 when walking outside of your house at 3 lpm     03/22/2015 f/u ov/Wert re: ftt x Jan 2016  Chief Complaint  Patient presents with  . Acute Visit    Pt c/o worsening SOB with exertion-2-3 weeks. Pt also states productive cough with clear ro yellow mucus and chest tightness. Pt denies wheezing, chest pain or recent fever    zoloft ? Lights changed/ blurry viz @ 50 mg per day so stopped it  Onset of sob was insidious, becoming more difficult to use hfa / now sob with more than slow walk = mmrc 2 even on 3lpm   >pred taper     04/03/2015 Follow up : Bronchiectasis  Pt returns for 2 week follow up .  We reviewed her meds and organized them into a med calendar with pt education .  Complains that she gets winded with minimal activity and  hard to walk with oxygen tank.  Cant walk long with heavy o2  Still has thick mucus , hard to get up . Mucus is stikcy.  Wants spacer for inhalers.  Some better from last ov with bronchiectasis flare , tx steroid taper.  Still weak and low energy.    Current Medications, Allergies, Complete Past Medical History, Past Surgical History, Family History, and Social History were reviewed in [employer] record.  ROS  The following are not active complaints unless bolded sore throat, dysphagia, dental problems, itching, sneezing,  nasal congestion or excess/ purulent secretions, ear ache,   fever, chills, sweats, unintended wt loss, pleuritic or exertional cp, hemoptysis,  orthopnea pnd or leg swelling, presyncope, palpitations, heartburn, abdominal pain, anorexia, nausea, vomiting, diarrhea  or change in bowel or  urinary habits, change in stools or urine, dysuria,hematuria,  rash, arthralgias, visual complaints, headache, numbness weakness or ataxia or problems with walking or coordination,  change in mood/affect or memory.          Past Medical History:  Bronchiectasis  - CT chest and sinus September 08, 2009 >> mild diffuse bronchiectasis with mild sphenoid sinusitis  - HFA poor September 05, 2009 > 75 % with coaching January 30, 2010  Hypertension  GERD..................................................................................Marland KitchenEdwards  - Pos EGD 05/13/08  Complex med regimen  ---Meds reviewed with pt education and computerized med calendar completed/adjusted October 17, 2009 , 02/18/2011 , 11/16/2012 . 06/27/2014         Objective:   Physical Exam  Amb wf nad  / nad mod hoarse with congested sounding cough  wt 1 132 October 04, 2008 >    126 January 30, 2010 > 02/12/2012 124 > 05/27/2012  126 > 122 08/26/2012 > 10/15/2012  119>118 11/16/2012 > 118 03/18/2013 > 114 10/29/2013 >113 11/12/2013 > 116 12/24/2013 > 03/24/2014  118 > 04/13/2014 113 >113 06/27/2014 >111 07/21/2014 > 08/04/14  107>  09/23/2014 104 > 12/30/2014 103 > 03/22/2015 102 101 04/03/15 HEENT mild turbinate edema, non-tender sinus, clear discharge. Oropharynx no thrush or excess pnd or cobblestoning. No JVD or cervical adenopathy. Mild accessory muscle hypertrophy. Trachea midline, nl thryroid. Chest with bilateral mid exp rhonchi ,no wheezing. Regular rate and rhythm without murmur gallop or rub or increase P2. No edema Abd: no hsm, nl excursion. Ext warm without cyanosis or clubbing.       Labs   reviewed:    Lab 12/30/14 1122  NA 140  K 3.7  CL 99  CO2 36*  BUN 13  CREATININE 0.67  GLUCOSE 94       Lab 12/30/14 1122  HGB 15.5*  HCT 46.5*  WBC 10.0  PLT 211.0     Lab Results  Component Value Date   TSH 1.19 12/30/2014     Lab Results  Component Value Date   PROBNP 32.0 12/30/2014     Lab Results  Component Value Date    ESRSEDRATE 9 12/30/2014       I CXR:  12/30/14 COPD. There is no pneumonia, CHF, nor other acute cardiopulmonary abnormality.    Assessment & Plan:

## 2015-04-06 NOTE — Assessment & Plan Note (Signed)
Cont on O2 .  

## 2015-04-06 NOTE — Assessment & Plan Note (Addendum)
Frequent Bronchiectasis flare  Add VEST therapy  Use spacer with inhalers    Plan  Continue on current regimen Order sent to begin VEST therapy .  Order sent for walker with seat.  May use spacer with symbicort .  Follow med calendar closely and bring to each visit.  follow up Dr. Sherene Sires  In 3- 4  months and As needed     Dr Sherene Sires Add : I strongly support the VEST due to :  CT Done 07/22/14    I have reviewed this study and have found bronchiectasis to be present  No care giver available to administer effective chest physiotherapy Has not responded to alternative therapy - she has flutter valve and used it but it did not effectively mobilize lung secretions During today's visit pt  reports productive cough > 6 m duration or 3 exac last 12 months   Sandrea Hughs, MD Pulmonary and Critical Care Medicine [facility] Cell [phone] After 5:30 PM or weekends, call [phone]

## 2015-04-08 IMAGING — CR DG CHEST 2V
2 series · 2 of 2 positions shown · non-contrast
Comparison: DG CHEST 2 VIEW dated 10/15/2012

CLINICAL DATA: Shortness of breath.  Cough.

EXAM:
CHEST  2 VIEW

[view not recorded (1 of 2)]
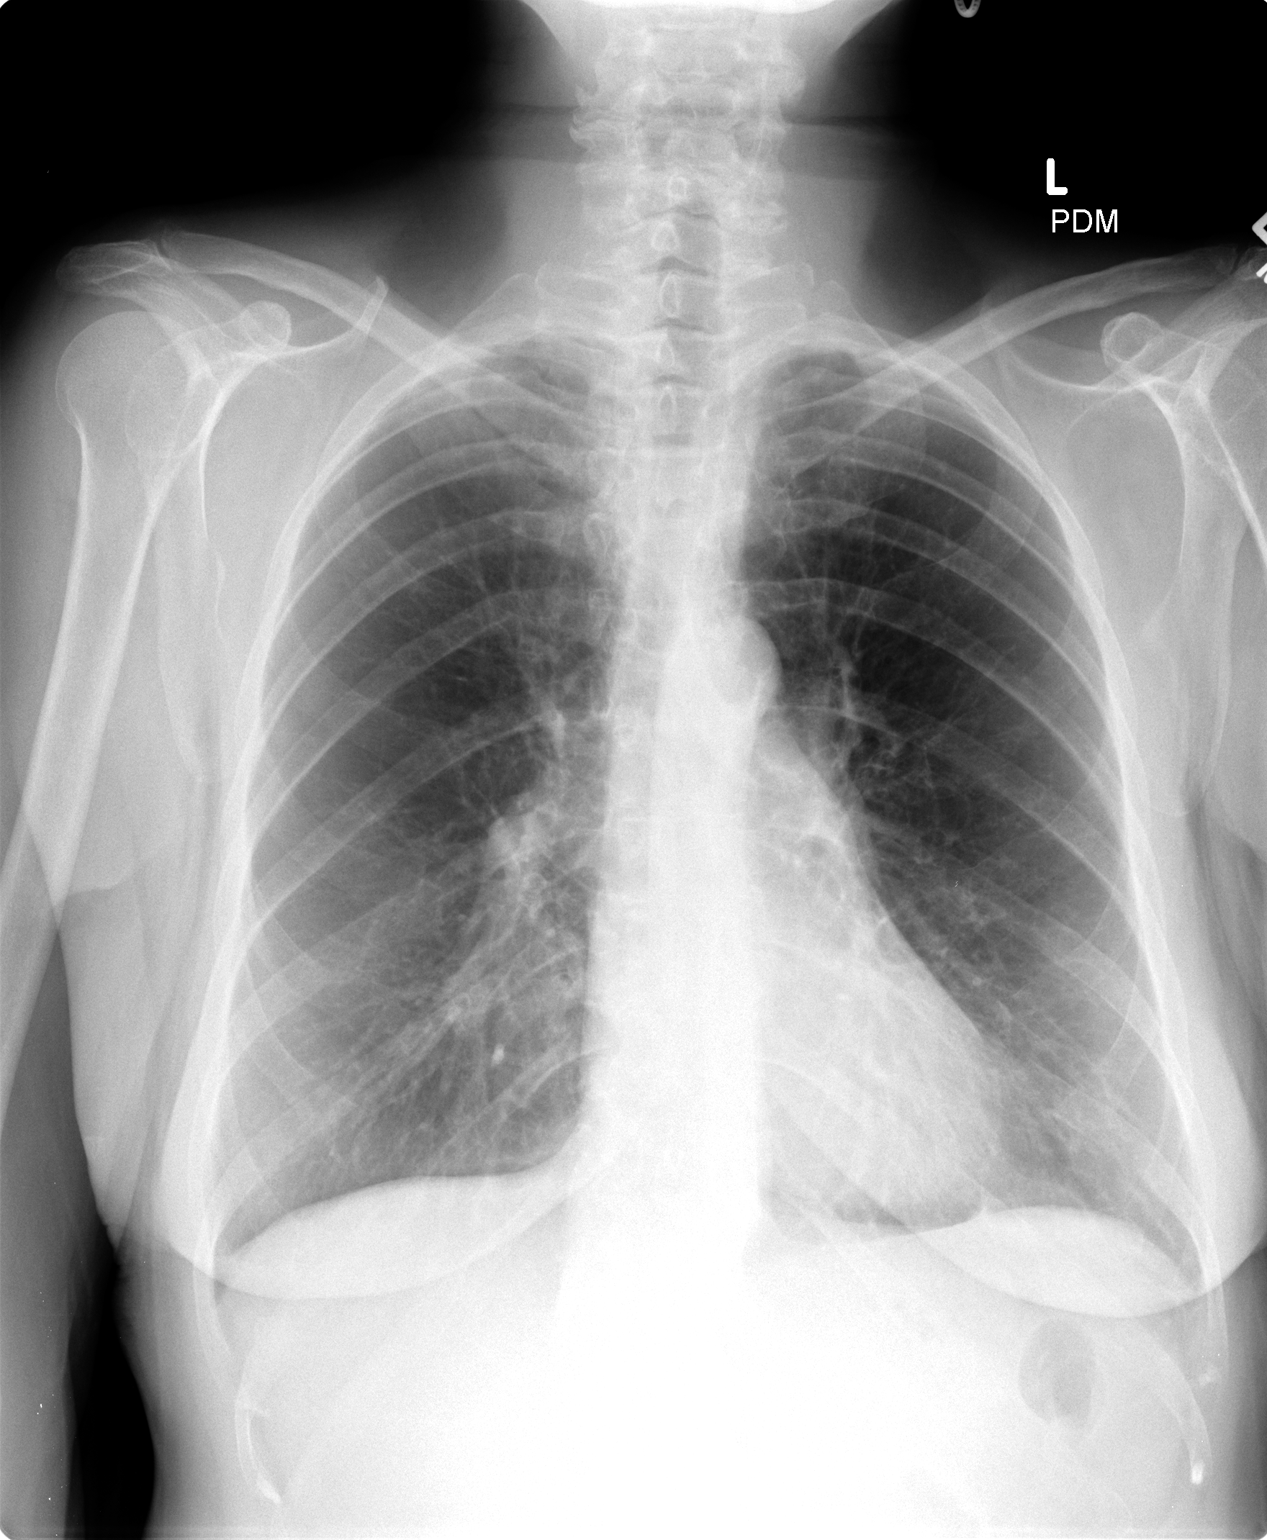

[view not recorded (2 of 2)]
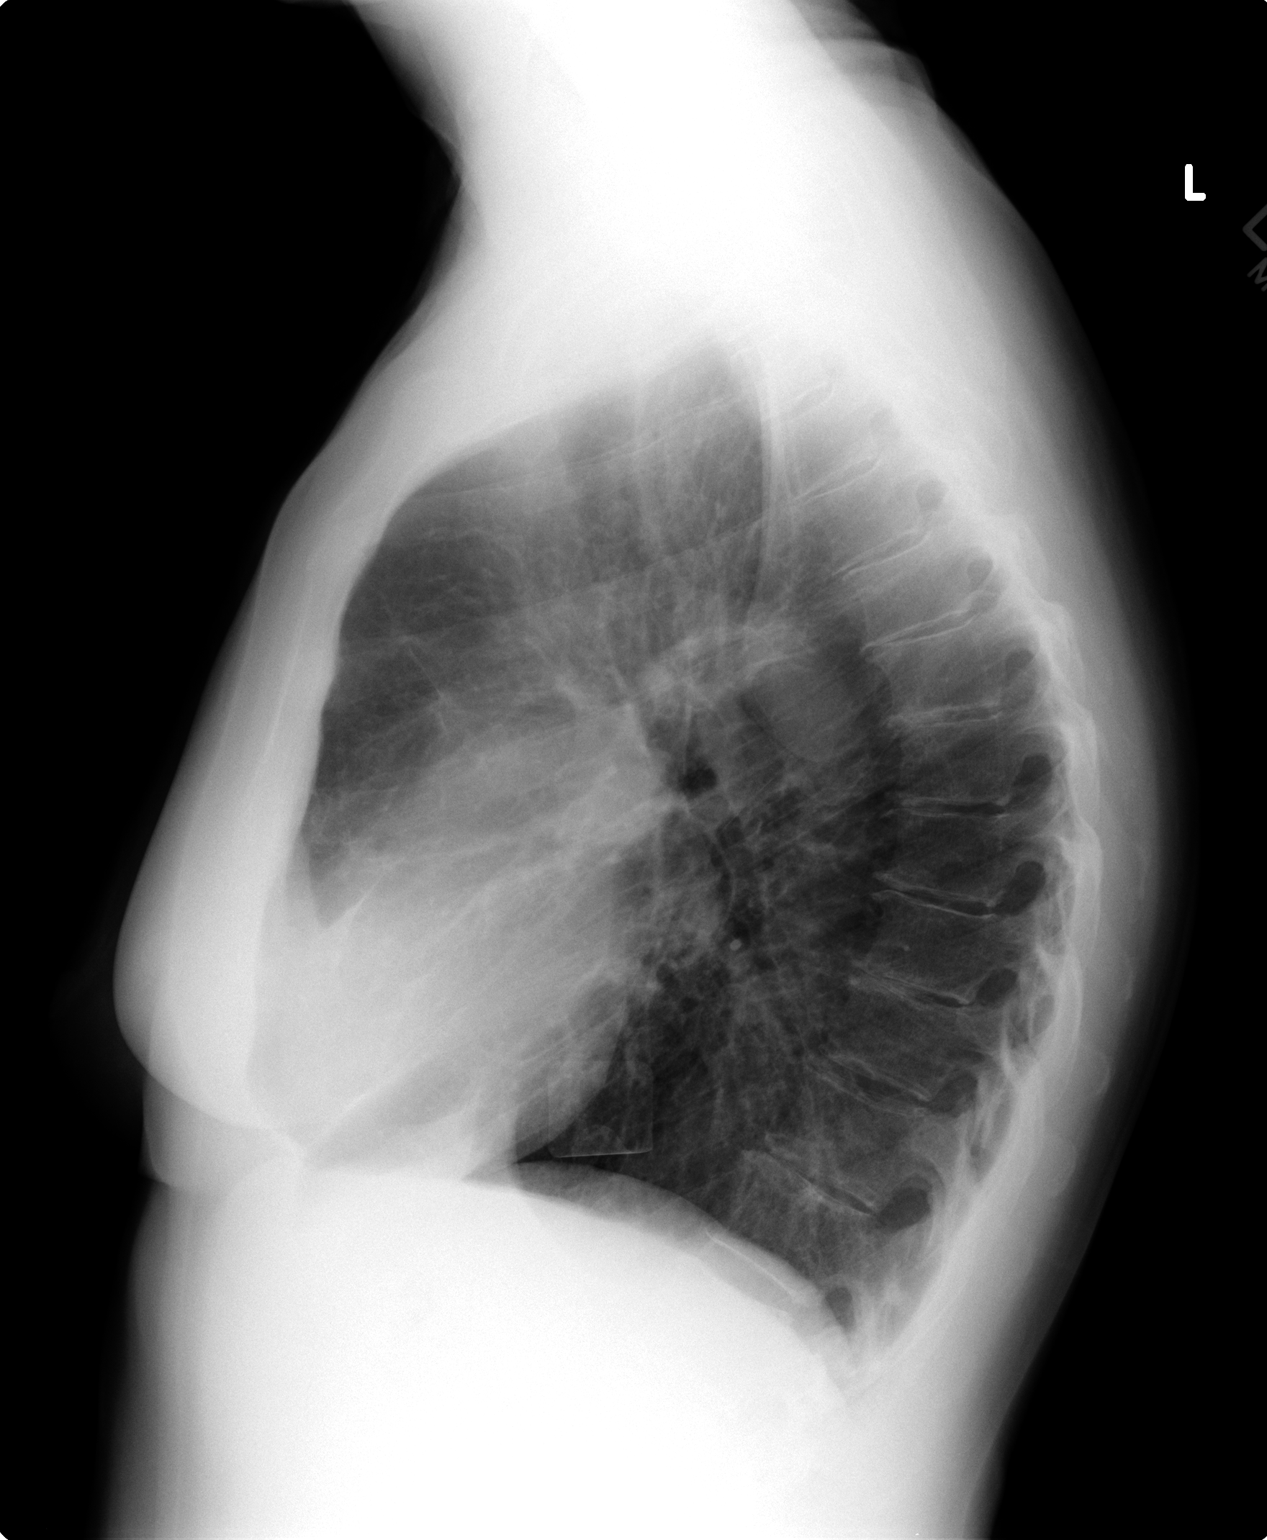

[2 of 2 positions shown; findings below may reference images not displayed]

FINDINGS: Mediastinum and hilar structures are normal. Lungs are clear of
acute infiltrates. Mild basilar interstitial prominence consistent
with interstitial fibrosis again noted. Heart size and pulmonary
vascularity normal. No pleural effusion or pneumothorax. No acute
bony abnormality identified.
IMPRESSION: No acute cardiopulmonary disease. Basilar interstitial prominence
consistent with interstitial fibrosis noted. Chest is stable from
prior exam.

## 2015-04-27 ENCOUNTER — Telehealth: Payer: Self-pay | Admitting: Adult Health

## 2015-04-27 NOTE — Telephone Encounter (Signed)
i did addendum on Tonya [name]'s last ov 04/03/15

## 2015-04-27 NOTE — Telephone Encounter (Signed)
Spoke with Tonya [name] at [facility], states that last ov note by MW needs to have an addendum added as to why he is recommending vest therapy.  Specifically, this addendum needs to say why a flutter valve wasn't effective for the patient.  Either because it was not moving secretions well, or if it was not effective as standalone therapy.    MW please advise if this addendum can be added.  Thanks!

## 2015-04-28 NOTE — Telephone Encounter (Signed)
lmtcb x1 

## 2015-04-30 ENCOUNTER — Emergency Department (HOSPITAL_COMMUNITY): Payer: Medicare Other

## 2015-04-30 ENCOUNTER — Encounter (HOSPITAL_COMMUNITY): Payer: Self-pay | Admitting: General Practice

## 2015-04-30 ENCOUNTER — Observation Stay (HOSPITAL_COMMUNITY)
Admission: EM | Admit: 2015-04-30 | Discharge: 2015-05-03 | Disposition: A | Discharge status: Home / Self Care | Payer: Medicare Other | Attending: Internal Medicine | Admitting: Internal Medicine

## 2015-04-30 DIAGNOSIS — K219 Gastro-esophageal reflux disease without esophagitis: Secondary | ICD-10-CM | POA: Diagnosis not present

## 2015-04-30 DIAGNOSIS — J441 Chronic obstructive pulmonary disease with (acute) exacerbation: Secondary | ICD-10-CM | POA: Diagnosis not present

## 2015-04-30 DIAGNOSIS — Z79899 Other long term (current) drug therapy: Secondary | ICD-10-CM | POA: Diagnosis not present

## 2015-04-30 DIAGNOSIS — J471 Bronchiectasis with (acute) exacerbation: Secondary | ICD-10-CM | POA: Diagnosis not present

## 2015-04-30 DIAGNOSIS — Z7951 Long term (current) use of inhaled steroids: Secondary | ICD-10-CM | POA: Diagnosis not present

## 2015-04-30 DIAGNOSIS — R0600 Dyspnea, unspecified: Secondary | ICD-10-CM

## 2015-04-30 DIAGNOSIS — E440 Moderate protein-calorie malnutrition: Secondary | ICD-10-CM | POA: Diagnosis not present

## 2015-04-30 DIAGNOSIS — F419 Anxiety disorder, unspecified: Secondary | ICD-10-CM | POA: Diagnosis present

## 2015-04-30 DIAGNOSIS — I10 Essential (primary) hypertension: Secondary | ICD-10-CM | POA: Diagnosis not present

## 2015-04-30 DIAGNOSIS — J479 Bronchiectasis, uncomplicated: Secondary | ICD-10-CM

## 2015-04-30 DIAGNOSIS — Z7982 Long term (current) use of aspirin: Secondary | ICD-10-CM | POA: Insufficient documentation

## 2015-04-30 DIAGNOSIS — E860 Dehydration: Secondary | ICD-10-CM | POA: Diagnosis not present

## 2015-04-30 DIAGNOSIS — J310 Chronic rhinitis: Secondary | ICD-10-CM | POA: Diagnosis not present

## 2015-04-30 DIAGNOSIS — Z79891 Long term (current) use of opiate analgesic: Secondary | ICD-10-CM | POA: Diagnosis not present

## 2015-04-30 DIAGNOSIS — Z9981 Dependence on supplemental oxygen: Secondary | ICD-10-CM | POA: Diagnosis not present

## 2015-04-30 DIAGNOSIS — R0602 Shortness of breath: Secondary | ICD-10-CM | POA: Diagnosis present

## 2015-04-30 DIAGNOSIS — Z792 Long term (current) use of antibiotics: Secondary | ICD-10-CM | POA: Insufficient documentation

## 2015-04-30 LAB — URINE MICROSCOPIC-ADD ON

## 2015-04-30 LAB — URINALYSIS, ROUTINE W REFLEX MICROSCOPIC
BILIRUBIN URINE: NEGATIVE
Glucose, UA: NEGATIVE mg/dL
KETONES UR: NEGATIVE mg/dL
LEUKOCYTES UA: NEGATIVE
NITRITE: NEGATIVE
PH: 5.5 (ref 5.0–8.0)
PROTEIN: NEGATIVE mg/dL
Specific Gravity, Urine: 1.015 (ref 1.005–1.030)
UROBILINOGEN UA: 0.2 mg/dL (ref 0.0–1.0)

## 2015-04-30 LAB — COMPREHENSIVE METABOLIC PANEL
ALBUMIN: 3.9 g/dL (ref 3.5–5.0)
ALK PHOS: 69 U/L (ref 38–126)
ALT: 20 U/L (ref 14–54)
ANION GAP: 9 (ref 5–15)
AST: 28 U/L (ref 15–41)
BUN: 14 mg/dL (ref 6–20)
CALCIUM: 9.5 mg/dL (ref 8.9–10.3)
CO2: 32 mmol/L (ref 22–32)
CREATININE: 0.88 mg/dL (ref 0.44–1.00)
Chloride: 101 mmol/L (ref 101–111)
GFR calc Af Amer: >60 mL/min (ref >60)
GFR calc non Af Amer: >60 mL/min (ref >60)
GLUCOSE: 108 mg/dL — AB (ref 65–99)
Potassium: 3.6 mmol/L (ref 3.5–5.1)
SODIUM: 142 mmol/L (ref 135–145)
Total Bilirubin: 0.5 mg/dL (ref 0.3–1.2)
Total Protein: 6.8 g/dL (ref 6.5–8.1)

## 2015-04-30 LAB — CBC WITH DIFFERENTIAL/PLATELET
BASOS ABS: 0 10*3/uL (ref 0.0–0.1)
BASOS PCT: 0 %
EOS ABS: 0.4 10*3/uL (ref 0.0–0.7)
Eosinophils Relative: 4 %
HCT: 45.3 % (ref 36.0–46.0)
HEMOGLOBIN: 15 g/dL (ref 12.0–15.0)
Lymphocytes Relative: 31 %
Lymphs Abs: 3.5 10*3/uL (ref 0.7–4.0)
MCH: 30.1 pg (ref 26.0–34.0)
MCHC: 33.1 g/dL (ref 30.0–36.0)
MCV: 90.8 fL (ref 78.0–100.0)
Monocytes Absolute: 1.1 10*3/uL — ABNORMAL HIGH (ref 0.1–1.0)
Monocytes Relative: 10 %
NEUTROS PCT: 55 %
Neutro Abs: 6.1 10*3/uL (ref 1.7–7.7)
Platelets: 183 10*3/uL (ref 150–400)
RBC: 4.99 MIL/uL (ref 3.87–5.11)
RDW: 12.8 % (ref 11.5–15.5)
WBC: 11.2 10*3/uL — AB (ref 4.0–10.5)

## 2015-04-30 LAB — TROPONIN I

## 2015-04-30 MED ORDER — ADULT MULTIVITAMIN W/MINERALS CH
1.0000 | ORAL_TABLET | Freq: Every evening | ORAL | Status: DC
  Administered 2015-05-01 – 2015-05-02 (×2): 1 via ORAL
  Filled 2015-04-30 (×2): qty 1

## 2015-04-30 MED ORDER — LOSARTAN POTASSIUM-HCTZ 100-12.5 MG PO TABS: 1.0000 | ORAL_TABLET | ORAL | Status: DC

## 2015-04-30 MED ORDER — METHYLPREDNISOLONE SODIUM SUCC 125 MG IJ SOLR
125.0000 mg | Freq: Once | INTRAMUSCULAR | Status: AC
  Administered 2015-04-30: 125 mg via INTRAVENOUS
  Filled 2015-04-30: qty 2

## 2015-04-30 MED ORDER — LOSARTAN POTASSIUM 50 MG PO TABS
100.0000 mg | ORAL_TABLET | Freq: Every day | ORAL | Status: DC
  Administered 2015-05-01 – 2015-05-03 (×3): 100 mg via ORAL
  Filled 2015-04-30 (×4): qty 2

## 2015-04-30 MED ORDER — LEVALBUTEROL HCL 1.25 MG/0.5ML IN NEBU
1.2500 mg | INHALATION_SOLUTION | Freq: Once | RESPIRATORY_TRACT | Status: AC
  Administered 2015-04-30: 1.25 mg via RESPIRATORY_TRACT
  Filled 2015-04-30: qty 0.5

## 2015-04-30 MED ORDER — ONDANSETRON HCL 4 MG PO TABS: 4.0000 mg | ORAL_TABLET | Freq: Four times a day (QID) | ORAL | Status: DC | PRN

## 2015-04-30 MED ORDER — SODIUM CHLORIDE 0.9 % IV BOLUS (SEPSIS)
500.0000 mL | Freq: Once | INTRAVENOUS | Status: AC
  Administered 2015-04-30: 500 mL via INTRAVENOUS

## 2015-04-30 MED ORDER — POTASSIUM CHLORIDE IN NACL 40-0.9 MEQ/L-% IV SOLN
INTRAVENOUS | Status: DC
  Administered 2015-05-01 – 2015-05-03 (×4): 50 mL/h via INTRAVENOUS
  Filled 2015-04-30 (×6): qty 1000

## 2015-04-30 MED ORDER — FAMOTIDINE 20 MG PO TABS
20.0000 mg | ORAL_TABLET | Freq: Every day | ORAL | Status: DC
  Administered 2015-05-01 – 2015-05-02 (×3): 20 mg via ORAL
  Filled 2015-04-30 (×3): qty 1

## 2015-04-30 MED ORDER — ONDANSETRON HCL 4 MG/2ML IJ SOLN: 4.0000 mg | Freq: Four times a day (QID) | INTRAMUSCULAR | Status: DC | PRN

## 2015-04-30 MED ORDER — IPRATROPIUM BROMIDE 0.02 % IN SOLN
0.5000 mg | Freq: Once | RESPIRATORY_TRACT | Status: AC
Start: 2015-04-30 — End: 2015-04-30
  Administered 2015-04-30: 0.5 mg via RESPIRATORY_TRACT
  Filled 2015-04-30: qty 2.5

## 2015-04-30 MED ORDER — MAGNESIUM SULFATE 2 GM/50ML IV SOLN
2.0000 g | Freq: Once | INTRAVENOUS | Status: AC
  Administered 2015-04-30: 2 g via INTRAVENOUS
  Filled 2015-04-30: qty 50

## 2015-04-30 MED ORDER — METHYLPREDNISOLONE SODIUM SUCC 40 MG IJ SOLR
40.0000 mg | Freq: Two times a day (BID) | INTRAMUSCULAR | Status: DC
  Administered 2015-05-01 – 2015-05-02 (×3): 40 mg via INTRAVENOUS
  Filled 2015-04-30 (×3): qty 1

## 2015-04-30 MED ORDER — ASPIRIN 81 MG PO CHEW
81.0000 mg | CHEWABLE_TABLET | Freq: Every evening | ORAL | Status: DC
  Administered 2015-05-01 – 2015-05-02 (×2): 81 mg via ORAL
  Filled 2015-04-30 (×2): qty 1

## 2015-04-30 MED ORDER — IPRATROPIUM BROMIDE 0.02 % IN SOLN
0.5000 mg | Freq: Four times a day (QID) | RESPIRATORY_TRACT | Status: DC
  Administered 2015-05-01 – 2015-05-02 (×6): 0.5 mg via RESPIRATORY_TRACT
  Filled 2015-04-30 (×6): qty 2.5

## 2015-04-30 MED ORDER — ENOXAPARIN SODIUM 30 MG/0.3ML ~~LOC~~ SOLN
30.0000 mg | SUBCUTANEOUS | Status: DC
Start: 2015-05-01 — End: 2015-05-03
  Administered 2015-05-01 – 2015-05-03 (×3): 30 mg via SUBCUTANEOUS
  Filled 2015-04-30 (×3): qty 0.3

## 2015-04-30 MED ORDER — LEVALBUTEROL HCL 0.63 MG/3ML IN NEBU
0.6300 mg | INHALATION_SOLUTION | Freq: Four times a day (QID) | RESPIRATORY_TRACT | Status: DC
  Administered 2015-05-01 – 2015-05-03 (×11): 0.63 mg via RESPIRATORY_TRACT
  Filled 2015-04-30 (×11): qty 3

## 2015-04-30 MED ORDER — HYDROCHLOROTHIAZIDE 12.5 MG PO CAPS
12.5000 mg | ORAL_CAPSULE | Freq: Every day | ORAL | Status: DC
  Administered 2015-05-01 – 2015-05-03 (×3): 12.5 mg via ORAL
  Filled 2015-04-30 (×3): qty 1

## 2015-04-30 MED ORDER — PANTOPRAZOLE SODIUM 40 MG PO TBEC
40.0000 mg | DELAYED_RELEASE_TABLET | Freq: Every day | ORAL | Status: DC
  Administered 2015-05-01 – 2015-05-03 (×3): 40 mg via ORAL
  Filled 2015-04-30 (×3): qty 1

## 2015-04-30 MED ORDER — DM-GUAIFENESIN ER 30-600 MG PO TB12: 1.0000 | ORAL_TABLET | Freq: Two times a day (BID) | ORAL | Status: DC | PRN

## 2015-04-30 NOTE — Progress Notes (Signed)
Pt arrived to unit alert and oriented. Oriented to room. Call bell at side. Bed alarm activated. Questions answered. Will continue to monitor.

## 2015-04-30 NOTE — ED Notes (Signed)
Admitting MD at bedside.

## 2015-04-30 NOTE — H&P (Signed)
[facility] History and Physical  Tonya [name] ZOX:[id number] DOB: 1946/07/24 DOA: 04/30/2015  Referring physician: Mirian Mo, MD PCP:  Duane Lope, MD   Chief Complaint: Shortness of breath.  HPI: Tonya [name] is a 68 y.o. female with a past medical history hypertension, COPD/bronchiectasis, GERD who comes to the ER due to complaints of progressively worse shortness of breath for several weeks, but it has gotten worse over the last 2-3 days to the point that the patient was getting dyspneic with very minimal of her at home. She usually helps take care of her husband, but has been so fatigued that has been unable to do her regular activities over the past 2 days. She denies fever, chills, but feels very fatigued. She has a productive yellowish sputum cough, but states that this has not changed in quality or quantity. She denies hemoptysis, pleuritic chest pain, rhonchi, but has occasional wheezing. She denies left-sided chest pain, palpitations, diaphoresis, dizziness, or pitting edema of the lower extremities.  Workup in the ER has been unremarkable, except for hyperinflated lungs on chest x-ray. The case was discussed by the emergency department with pulmonology on call, who recommended for the patient to be admitted and they will be consulting in the morning.  Review of Systems:  Constitutional:  Positive weight loss of about 20 pounds since last year. Positive fatigue.  Denies night sweats, Fevers, chills,  HEENT:  Positive nasal congestion, post nasal drip,  No headaches, Difficulty swallowing,Tooth/dental problems,Sore throat,  No sneezing, itching, ear ache, Cardio-vascular:  No chest pain, Orthopnea, PND, swelling in lower extremities, anasarca, dizziness, palpitations  GI:  Positive nausea, loss of appetite No heartburn, indigestion, abdominal pain,  vomiting, diarrhea, change in bowel habits,   Resp:  Positive shortness of breath with exertion or at rest,  positive wheezing. Positive excess mucus, positive productive cough. No non-productive cough, No hemoptysis.No .No chest wall deformity  Skin:  no rash or lesions. .No change in color of mucus GU:  no dysuria, change in color of urine, no urgency or frequency. No flank pain.  Musculoskeletal:  Frequent muscle or joint pain. No decreased range of motion. Occasional back pain.  Psych:  Frequent anxiety. No change in mood or affect. No depression or . No memory loss.   Past Medical History  Diagnosis Date  . Bronchiectasis   . Hypertension   . GERD (gastroesophageal reflux disease)    History reviewed. No pertinent past surgical history. Social History:  reports that she has never smoked. She has never used smokeless tobacco. She reports that she does not drink alcohol. Her drug history is not on file.  Allergies  Allergen Reactions  . Alprazolam     REACTION: "I just feel funny"  . Doxycycline     REACTION: rash, redness in face, slight swelling in lips  . Levaquin [Levofloxacin]     REACTION: redness in face, rash, slight swelling in lips  . Metoclopramide Hcl   . Moxifloxacin   . Penicillins   . Sulfonamide Derivatives   . Tramadol     REACTION: made pt "feel funny"    Family History  Problem Relation Age of Onset  . Emphysema Mother     smoker  . Lung cancer Father     Prior to Admission medications   Medication Sig Start Date End Date Taking? Authorizing Provider  aspirin 81 MG tablet Take 81 mg by mouth every evening.     Historical Provider, MD  azithromycin (ZITHROMAX) 250  MG tablet Take 2 tablets today then 1 tablet daily until gone Patient taking differently: Take as directed as needed for change in nasty mucus 03/22/15   Nyoka Cowden, MD  b complex vitamins tablet Take 1 tablet by mouth every morning.    Historical Provider, MD  benzonatate (TESSALON) 200 MG capsule TAKE 1 CAPSULE (200 MG TOTAL) BY MOUTH 3 (THREE) TIMES DAILY AS NEEDED FOR COUGH. Patient  not taking: Reported on 03/22/2015 03/31/14   Nyoka Cowden, MD  budesonide-formoterol [facility]) 160-4.5 MCG/ACT inhaler Take 2 puffs first thing in am and then another 2 puffs about 12 hours later. 03/22/15   Nyoka Cowden, MD  Calcium Carb-Cholecalciferol (CALCIUM PLUS VITAMIN D3 PO) Take 1 tablet by mouth every morning.     Historical Provider, MD  cetirizine (ZYRTEC) 10 MG tablet Take 10 mg by mouth at bedtime as needed (for nasal drainage and throat tickle).    Historical Provider, MD  dextromethorphan-guaiFENesin (MUCINEX DM) 30-600 MG per 12 hr tablet Take 1-2 tablets by mouth 2 (two) times daily as needed (for thick mucus, congestion, cough). For cough and congestion Patient taking differently: Take 1 tablet twice a day as needed for thick mucus, congestion, cough ((Plan A)) 02/18/11   Tammy S Parrett, NP  famotidine (PEPCID) 20 MG tablet Take 20 mg by mouth at bedtime.    Historical Provider, MD  fluticasone (FLONASE) 50 MCG/ACT nasal spray USE 1 TO 2 SPRAYS IN EACH NOSTRIL IN THE MORNING AND AT BEDTIME 12/28/14   Nyoka Cowden, MD  HYDROcodone-acetaminophen (VICODIN) 5-500 MG per tablet Take 1/2-1 tablet by mouth every 4 hours as needed for pain and cough ((Plan C))    Historical Provider, MD  levalbuterol (XOPENEX HFA) 45 MCG/ACT inhaler Inhale 1-2 puffs into the lungs every 4 (four) hours as needed for wheezing. Patient taking differently: Inhale 1-2 puffs into the lungs every 4 (four) hours as needed for wheezing or shortness of breath.  03/22/15   Nyoka Cowden, MD  losartan-hydrochlorothiazide (HYZAAR) 100-12.5 MG per tablet Take 1 tablet by mouth every morning.     Historical Provider, MD  Multiple Vitamin (MULTIVITAMIN) tablet Take 1 tablet by mouth every evening.     Historical Provider, MD  OXYGEN Inhale 2 L into the lungs at bedtime.    Historical Provider, MD  OXYGEN Use 3L of O2 with activity as needed    Historical Provider, MD  oxymetazoline (AFRIN) 0.05 % nasal spray 2 puffs  twice daily for five days and as needed for nasal congestion 02/18/11   Tammy S Parrett, NP  pantoprazole (PROTONIX) 40 MG tablet Take 30- 60 min before your first and last meals of the day Patient taking differently: Take 30- 60 min before breakfast every morning 08/26/12   Nyoka Cowden, MD  Respiratory Therapy Supplies (FLUTTER) DEVI Use several times a day as needed for thick mucus, congestion, cough Patient taking differently: Use every hour as needed for thick mucus, congestion, and cough 02/18/11   Julio Sicks, NP   Physical Exam: Filed Vitals:   04/30/15 1915 04/30/15 1930 04/30/15 2030 04/30/15 2045  BP: 123/67 135/53 125/58 141/66  Pulse: 92 88 86 83  Temp:      TempSrc:      Resp: 25 37 26 25  SpO2: 93% 96% 91% 93%    Wt Readings from Last 3 Encounters:  04/03/15 45.813 kg (101 lb)  03/22/15 46.267 kg (102 lb)  12/30/14 46.72 kg (103 lb)  General:  Appears calm and comfortable Eyes: PERRL, normal lids, irises & conjunctiva ENT: grossly normal hearing, lips & tongue are dry. Neck: no LAD, masses or thyromegaly Cardiovascular: RRR, no m/r/g. No LE edema. Telemetry: SR, no arrhythmias  Respiratory: Decreased breath sounds bilaterally, no rhonchi, mild wheezing. Abdomen: soft, ntnd Skin: no rash or induration seen on limited exam Musculoskeletal: grossly normal tone BUE/BLE Psychiatric: grossly normal mood and affect, speech fluent and appropriate Neurologic: grossly non-focal.          Labs on Admission:  Basic Metabolic Panel:  Recent Labs Lab 04/30/15 1713  NA 142  K 3.6  CL 101  CO2 32  GLUCOSE 108*  BUN 14  CREATININE 0.88  CALCIUM 9.5   Liver Function Tests:  Recent Labs Lab 04/30/15 1713  AST 28  ALT 20  ALKPHOS 69  BILITOT 0.5  PROT 6.8  ALBUMIN 3.9   CBC:  Recent Labs Lab 04/30/15 1713  WBC 11.2*  NEUTROABS 6.1  HGB 15.0  HCT 45.3  MCV 90.8  PLT 183   Cardiac Enzymes:  Recent Labs Lab 04/30/15 1713  TROPONINI <0.03     Radiological Exams on Admission: Dg Chest 2 View  04/30/2015   CLINICAL DATA:  Patient with shortness of breath for multiple months.  EXAM: CHEST  2 VIEW  COMPARISON:  Chest radiograph 12/30/2014  FINDINGS: Stable cardiac and mediastinal contours. No consolidative pulmonary opacities. No pleural effusion or pneumothorax. Lungs are hyperexpanded. Thoracic spine degenerative changes.  IMPRESSION: Pulmonary hyperinflation.  No acute cardiopulmonary process.   Electronically Signed   By: Annia Belt M.D.   On: 04/30/2015 17:50    EKG: Independently reviewed. Vent. rate 86 BPM PR interval 171 ms QRS duration 90 ms QT/QTc 363/434 ms P-R-T axes 84 40 71 Sinus rhythm Anterior infarct, old No significant change since last tracing  Assessment/Plan Principal Problem:   COPD exacerbation   Bronchiectasis. Admit for COPD exacerbation treatment with bronchodilators, IV Solu-Medrol and supplemental oxygen. Pulmonology will be evaluating the patient tomorrow morning.  Active Problems:   GERD Continue, giving and proton pump inhibitor.    Chronic rhinitis Continue when necessary treatment with nasal steroid and decongestant sprays.    Hypertension Continue Hyzaar and monitor blood pressure. Consider switching to losartan only since the patient has been losing weight, has been having trouble with oral intake for some time to avoid dehydration or electrolyte abnormalities.    Dehydration Gentle IV fluid hydration. I will supplement the IV fluids with potassium since the patient is using beta agonist nebulizations.    Anxiety Likely due to worsening shortness of breath. She is allergic to alprazolam. Consider BuSpar or Atarax if symptoms worsen. Patient to follow-up with primary care provider for long-term treatment.    Consult Pulmonology was consulted by the emergency department and will see the patient in the morning.  Code Status: Full code. DVT Prophylaxis: Lovenox SQ. Family  Communication:  Disposition Plan: Admit for treatment with bronchodilators, steroids, supplemental oxygen.  Time spent: Over 70 minutes were spent during the process of this admission.  Bobette Mo [facility] Pager [phone].

## 2015-04-30 NOTE — ED Notes (Signed)
Pt brought in via [facility] with complaints of shortness of breath over the past couple of months. Pt has a history of bronchiectasis and recently started using a vibrations vest. Pt stated "I am tired of feeling this way". Per EMS pt sounded very tight, and was using accessory muscles when they arrived. EMS gave pt 10 mg of albuterol, 1 of atrovent, and 125 of solumedrol. Pt is A/O. Pt normally wears 2L O2.

## 2015-04-30 NOTE — ED Provider Notes (Signed)
CSN: [id number]     Arrival date & time 04/30/15  1642 History   First MD Initiated Contact with Patient 04/30/15 1643     Chief Complaint  Patient presents with  . Shortness of Breath     (Consider location/radiation/quality/duration/timing/severity/associated sxs/prior Treatment) Patient is a 69 y.o. female presenting with shortness of breath.  Shortness of Breath Severity:  Moderate Onset quality:  Gradual Timing:  Constant Progression:  Worsening Chronicity:  Chronic Context comment:  Chronic dyspnea, 2L home O2 PRN Relieved by:  Nothing Worsened by:  Nothing tried Associated symptoms: cough (chronic)   Associated symptoms: no abdominal pain, no chest pain, no diaphoresis, no fever, no rash and no vomiting     Past Medical History  Diagnosis Date  . Bronchiectasis   . Hypertension   . GERD (gastroesophageal reflux disease)    History reviewed. No pertinent past surgical history. Family History  Problem Relation Age of Onset  . Emphysema Mother     smoker  . Lung cancer Father    Social History  Substance Use Topics  . Smoking status: Never Smoker   . Smokeless tobacco: Never Used  . Alcohol Use: No   OB History    No data available     Review of Systems  Constitutional: Negative for fever and diaphoresis.  Respiratory: Positive for cough (chronic) and shortness of breath.   Cardiovascular: Negative for chest pain.  Gastrointestinal: Negative for vomiting and abdominal pain.  Skin: Negative for rash.  All other systems reviewed and are negative.     Allergies  Alprazolam; Metoclopramide hcl; Tramadol; Doxycycline; Levaquin; Moxifloxacin; Penicillins; and Sulfonamide derivatives  Home Medications   Prior to Admission medications   Medication Sig Start Date End Date Taking? Authorizing Provider  aspirin EC 81 MG tablet Take 81 mg by mouth at bedtime.   Yes Historical Provider, MD  b complex vitamins tablet Take 1 tablet by mouth daily.    Yes  Historical Provider, MD  budesonide-formoterol (SYMBICORT) 160-4.5 MCG/ACT inhaler Take 2 puffs first thing in am and then another 2 puffs about 12 hours later. Patient taking differently: Inhale 2 puffs into the lungs every 12 (twelve) hours.  03/22/15  Yes Nyoka Cowden, MD  Calcium Carb-Cholecalciferol (CALCIUM PLUS VITAMIN D3 PO) Take 1 tablet by mouth daily.    Yes Historical Provider, MD  Dextromethorphan-Guaifenesin (MUCINEX DM MAXIMUM STRENGTH) 60-1200 MG TB12 Take 1 tablet by mouth 2 (two) times daily as needed (congestion/cough).   Yes Historical Provider, MD  famotidine (PEPCID) 20 MG tablet Take 20 mg by mouth at bedtime.   Yes Historical Provider, MD  fluticasone (FLONASE) 50 MCG/ACT nasal spray USE 1 TO 2 SPRAYS IN EACH NOSTRIL IN THE MORNING AND AT BEDTIME Patient taking differently: USE 1 TO 2 SPRAYS IN EACH NOSTRIL IN THE MORNING  AS NEEDED FOR CONGESTION  AND EVERY NIGHT AT BEDTIME 12/28/14  Yes Nyoka Cowden, MD  levalbuterol [facility] HFA) 45 MCG/ACT inhaler Inhale 1-2 puffs into the lungs every 4 (four) hours as needed for wheezing. Patient taking differently: Inhale 2 puffs into the lungs every 4 (four) hours as needed for wheezing or shortness of breath.  03/22/15  Yes Nyoka Cowden, MD  losartan-hydrochlorothiazide (HYZAAR) 100-12.5 MG per tablet Take 1 tablet by mouth daily.    Yes Historical Provider, MD  Multiple Vitamin (MULTIVITAMIN WITH MINERALS) TABS tablet Take 1 tablet by mouth at bedtime.   Yes Historical Provider, MD  OXYGEN Inhale 2 L into the  lungs at bedtime.   Yes Historical Provider, MD  OXYGEN Use 3L of O2 with activity as needed   Yes Historical Provider, MD  pantoprazole (PROTONIX) 40 MG tablet Take 30- 60 min before your first and last meals of the day Patient taking differently: Take 40 mg by mouth daily before breakfast. Take 30- 60 min before breakfast every morning 08/26/12  Yes Nyoka Cowden, MD  Respiratory Therapy Supplies (FLUTTER) DEVI Use several  times a day as needed for thick mucus, congestion, cough Patient taking differently: Use every hour as needed for thick mucus, congestion, and cough 02/18/11  Yes Tammy S Parrett, NP  albuterol (PROVENTIL) (2.5 MG/3ML) 0.083% nebulizer solution Take 3 mLs (2.5 mg total) by nebulization every 6 (six) hours as needed for wheezing or shortness of breath. 05/03/15   Leana Roe [name], MD  benzonatate (TESSALON) 200 MG capsule TAKE 1 CAPSULE (200 MG TOTAL) BY MOUTH 3 (THREE) TIMES DAILY AS NEEDED FOR COUGH. Patient not taking: Reported on 03/22/2015 03/31/14   Nyoka Cowden, MD  cefUROXime (CEFTIN) 250 MG tablet Take 1 tablet (250 mg total) by mouth 2 (two) times daily with a meal. 05/03/15   Starleen Arms, MD  dextromethorphan-guaiFENesin (MUCINEX DM) 30-600 MG per 12 hr tablet Take 1-2 tablets by mouth 2 (two) times daily as needed (for thick mucus, congestion, cough). For cough and congestion Patient not taking: Reported on 04/30/2015 02/18/11   Virgel Bouquet Parrett, NP  oxymetazoline (AFRIN) 0.05 % nasal spray 2 puffs twice daily for five days and as needed for nasal congestion Patient not taking: Reported on 04/30/2015 02/18/11   Tammy S Parrett, NP  predniSONE (DELTASONE) 10 MG tablet Take  po daily for 2 days, then take  po daily for 3 days, then take  po daily for two days, then take  po daily for 2 daysthen stop. 05/03/15   Leana Roe [name], MD  saccharomyces boulardii (FLORASTOR) 250 MG capsule Take 1 capsule (250 mg total) by mouth 2 (two) times daily. 05/03/15   Leana Roe [name], MD   BP 153/62 mmHg  Pulse 99  Temp(Src) 98.2 F (36.8 C) (Oral)  Resp 22  Ht  (1.549 m)  Wt 103 lb 13.4 oz (47.1 kg)  BMI 19.63 kg/m2  SpO2 94% Physical Exam  Constitutional: She is oriented to person, place, and time. She appears well-developed and well-nourished.  HENT:  Head: Normocephalic and atraumatic.  Right Ear: External ear normal.  Left Ear: External ear normal.  Eyes:  Conjunctivae and EOM are normal. Pupils are equal, round, and reactive to light.  Neck: Normal range of motion. Neck supple.  Cardiovascular: Normal rate, regular rhythm, normal heart sounds and intact distal pulses.   Pulmonary/Chest: Effort normal and breath sounds normal.  Abdominal: Soft. Bowel sounds are normal. There is no tenderness.  Musculoskeletal: Normal range of motion.  Neurological: She is alert and oriented to person, place, and time.  Skin: Skin is warm and dry.  Vitals reviewed.   ED Course  Procedures (including critical care time) Labs Review Labs Reviewed  CBC WITH DIFFERENTIAL/PLATELET - Abnormal; Notable for the following:    WBC 11.2 (*)    Monocytes Absolute 1.1 (*)    All other components within normal limits  COMPREHENSIVE METABOLIC PANEL - Abnormal; Notable for the following:    Glucose, Bld 108 (*)    All other components within normal limits  URINALYSIS, ROUTINE W REFLEX MICROSCOPIC (NOT AT [facility]) - Abnormal; Notable for  the following:    Hgb urine dipstick SMALL (*)    All other components within normal limits  CBC - Abnormal; Notable for the following:    WBC 16.7 (*)    All other components within normal limits  BASIC METABOLIC PANEL - Abnormal; Notable for the following:    Glucose, Bld 130 (*)    Calcium 8.7 (*)    All other components within normal limits  CULTURE, EXPECTORATED SPUTUM-ASSESSMENT  CULTURE, EXPECTORATED SPUTUM-ASSESSMENT  TROPONIN I  URINE MICROSCOPIC-ADD ON  MAGNESIUM  PHOSPHORUS    Imaging Review No results found. I have personally reviewed and evaluated these images and lab results as part of my medical decision-making.   EKG Interpretation   Date/Time:  Sunday April 30 2015 16:48:43 EDT Ventricular Rate:  86 PR Interval:  171 QRS Duration: 90 QT Interval:  363 QTC Calculation: 434 R Axis:   40 Text Interpretation:  Sinus rhythm Anterior infarct, old No significant  change since last tracing Confirmed by  Mirian Mo [phone]) on  04/30/2015 5:02:07 PM      MDM   Final diagnoses:  None    69 y.o. female with pertinent PMH of HTN, GERD presents with dyspnea x 2 months.  Has chronic dyspnea, feels it is worse.  Requiring O2.  Admitted in stable condition.    I have reviewed all laboratory and imaging studies if ordered as above  No diagnosis found.      Mirian Mo, MD 05/06/15 [phone]

## 2015-04-30 NOTE — ED Notes (Signed)
Family requesting update; Dr.Gentry at bedside

## 2015-05-01 DIAGNOSIS — J441 Chronic obstructive pulmonary disease with (acute) exacerbation: Secondary | ICD-10-CM | POA: Diagnosis not present

## 2015-05-01 DIAGNOSIS — F419 Anxiety disorder, unspecified: Secondary | ICD-10-CM | POA: Diagnosis not present

## 2015-05-01 DIAGNOSIS — K219 Gastro-esophageal reflux disease without esophagitis: Secondary | ICD-10-CM

## 2015-05-01 DIAGNOSIS — I10 Essential (primary) hypertension: Secondary | ICD-10-CM | POA: Diagnosis not present

## 2015-05-01 DIAGNOSIS — E860 Dehydration: Secondary | ICD-10-CM

## 2015-05-01 DIAGNOSIS — J471 Bronchiectasis with (acute) exacerbation: Secondary | ICD-10-CM | POA: Diagnosis not present

## 2015-05-01 DIAGNOSIS — E440 Moderate protein-calorie malnutrition: Secondary | ICD-10-CM | POA: Insufficient documentation

## 2015-05-01 LAB — EXPECTORATED SPUTUM ASSESSMENT W GRAM STAIN, RFLX TO RESP C

## 2015-05-01 LAB — MAGNESIUM: MAGNESIUM: 2.1 mg/dL (ref 1.7–2.4)

## 2015-05-01 LAB — PHOSPHORUS: PHOSPHORUS: 3.6 mg/dL (ref 2.5–4.6)

## 2015-05-01 LAB — EXPECTORATED SPUTUM ASSESSMENT W REFEX TO RESP CULTURE

## 2015-05-01 MED ORDER — SALINE SPRAY 0.65 % NA SOLN
1.0000 | NASAL | Status: DC | PRN
  Administered 2015-05-01: 1 via NASAL
  Filled 2015-05-01: qty 44

## 2015-05-01 MED ORDER — ENSURE ENLIVE PO LIQD
237.0000 mL | Freq: Two times a day (BID) | ORAL | Status: DC
  Administered 2015-05-01 – 2015-05-03 (×3): 237 mL via ORAL

## 2015-05-01 NOTE — Care Management Note (Addendum)
Case Management Note  Patient Details  Name: MARIANY MACKINTOSH MRN: [id number] Date of Birth: 1946-03-09  Subjective/Objective:                  Date: 05-01-15 Monday Spoke with patient at the bedside along with daughter Jadene Pierini [phone]. Introduced self as Sports coach and explained role in discharge planning and how to be reached. Verified patient lives in [place] in house, with spouse who has Parkinsons who needs some assistance with ADLs due to decrease muscle control in hands and feet and legs.. Verified patient anticipates to go home with family,  But may need ALF in the future.  Patient and daughter given information on [employer], and private duty list of caregivers at time of discharge and will have part-time supervision by family at this time to best of their knowledge. Patient has DME rolator and oxygen through [facility] at 2L during the night and PRN with activity.  Expressed potential need for no other DME. Patient  confirmed  needing help with their medication. Patient drives  to MD appointments. Verified patient has PCP Dr Tenny Craw.  Patient was provided choice and selected [facility] for home health needs if they arise.  Plan: CM will continue to follow for discharge planning and [facility] resources.   Lawerance Sabal RN BSN CM [phone]   Action/Plan:  05-02-15 Tuesday Spoke with patient and family, daughter Toniann Fail requesting PT/ OT [facility] RN [facility], as suggested by Dr Kendrick Fries, [facility] chosen for [facility] services. Patient will require nebulizer prior to discharge, referral made to [name] and will be delivered to room. 05-03-15 Referral made to [facility] for [facility] services.  Expected Discharge Date:                  Expected Discharge Plan:  Home w Home Health Services  In-House Referral:     Discharge planning Services  CM Consult  Post Acute Care Choice:    Choice offered to:     DME Arranged:    DME Agency:     HH Arranged:    HH Agency:     Status of Service:  In process, will continue to  follow  Medicare Important Message Given:    Date Medicare IM Given:    Medicare IM give by:    Date Additional Medicare IM Given:    Additional Medicare Important Message give by:     If discussed at Long Length of Stay Meetings, dates discussed:    Additional Comments:  Lawerance Sabal, RN 05/01/2015, 10:29 AM

## 2015-05-01 NOTE — Evaluation (Signed)
Occupational Therapy Evaluation Patient Details Name: Tonya [name] MRN: [id number] DOB: 1946-03-12 Today's Date: 05/01/2015    History of Present Illness 69 y.o. female with a past medical history hypertension, COPD/bronchiectasis, GERD who comes to the ER due to complaints of progressively worse shortness of breath    Clinical Impression   Pt was performing self care, light housekeeping and light meal prep at a modified independent level prior to admission. She was assisted with housekeeping and more recently with errands. Pt presents with decreased activity tolerance requiring pursed lip breathing, slower pace and standing rest breaks during ADL.  Educated pt at length in energy conservation and provided handout.  Pt would like to return to pulmonary rehab upon discharge. No further OT needs.    Follow Up Recommendations  No OT follow up    Equipment Recommendations  None recommended by OT    Recommendations for Other Services       Precautions / Restrictions Precautions Precautions: Fall      Mobility Bed Mobility Overal bed mobility: Modified Independent                Transfers Overall transfer level: Modified independent                    Balance Overall balance assessment: Needs assistance   Sitting balance-Leahy Scale: Good       Standing balance-Leahy Scale: Good                              ADL Overall ADL's : Modified independent                                       General ADL Comments: Educated pt at length in energy conservation strategies.  Pt employed breathing techniques during exertion without cues.     Vision     Perception     Praxis      Pertinent Vitals/Pain Pain Assessment: No/denies pain     Hand Dominance Right   Extremity/Trunk Assessment Upper Extremity Assessment Upper Extremity Assessment: Overall WFL for tasks assessed   Lower Extremity Assessment Lower Extremity  Assessment: Overall WFL for tasks assessed   Cervical / Trunk Assessment Cervical / Trunk Assessment: Kyphotic   Communication Communication Communication: No difficulties   Cognition Arousal/Alertness: Awake/alert Behavior During Therapy: WFL for tasks assessed/performed Overall Cognitive Status: Within Functional Limits for tasks assessed                     General Comments       Exercises       Shoulder Instructions      Home Living Family/patient expects to be discharged to:: Private residence Living Arrangements: Spouse/significant other Available Help at Discharge: Family;Available PRN/intermittently Type of Home: House Home Access: Stairs to enter [employer] of Steps: 5 Entrance Stairs-Rails: Right Home Layout: One level     Bathroom Shower/Tub: Chief Strategy Officer: Standard     Home Equipment: Environmental consultant - 4 wheels;Shower seat;Hand held shower head          Prior Functioning/Environment Level of Independence: Needs assistance  Gait / Transfers Assistance Needed: using rollator when out, no device inside ADL's / Homemaking Assistance Needed: has a housekeeper every 2 weeks, family has been helping to get groceries lately.   Comments: pt cares  for her husband who has Parkinsons    OT Diagnosis:     OT Problem List:     OT Treatment/Interventions:      OT Goals(Current goals can be found in the care plan section)    OT Frequency:     Barriers to D/C:            Co-evaluation              End of Session    Activity Tolerance: Patient limited by fatigue Patient left: in chair;with call bell/phone within reach;with family/visitor present   Time: 1455-1525 OT Time Calculation (min): 30 min Charges:  OT General Charges $OT Visit: 1 Procedure OT Evaluation $Initial OT Evaluation Tier I: 1 Procedure G-Codes: OT G-codes **NOT FOR INPATIENT CLASS** Functional Assessment Tool Used: clinical  judgement Functional Limitation: Self care Self Care Current Status (Z6109): At least 1 percent but less than 20 percent impaired, limited or restricted Self Care Goal Status (U0454): At least 1 percent but less than 20 percent impaired, limited or restricted Self Care Discharge Status [phone]): At least 1 percent but less than 20 percent impaired, limited or restricted  Evern Bio 05/01/2015, 4:05 PM  [phone]

## 2015-05-01 NOTE — Evaluation (Signed)
Physical Therapy Evaluation Patient Details Name: Tonya [name] MRN: [id number] DOB: [birth year] Today's Date: 05/01/2015   History of Present Illness  69 y.o. female with a past medical history hypertension, COPD/bronchiectasis, GERD who comes to the ER due to complaints of progressively worse shortness of breath   Clinical Impression  Pt very pleasant and moving well. 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 Pt with need for HHA for balance and comfort with gait with rollator use at home. Pt with sats 88% on 2L at rest with sats 90-92% on 4L with gait with 2 periods of sats dropping to 86% during gait and recovered with standing rest. Pt educated for energy conservation, monitoring oxygen sats and recommendation for longterm ALF planning to be initiated. No acute therapy needs at this time with pt encouraged to continue gait trials with nursing supervision acutely.     Follow Up Recommendations No PT follow up;Other (comment) (pulmonary rehab)    Equipment Recommendations  None recommended by PT    Recommendations for Other Services       Precautions / Restrictions Precautions Precautions: Fall      Mobility  Bed Mobility Overal bed mobility: Modified Independent                Transfers Overall transfer level: Modified independent                  Ambulation/Gait Ambulation/Gait assistance: Modified independent (Device/Increase time) Ambulation Distance (Feet): 400 Feet Assistive device: 1 person hand held assist;None     Gait velocity interpretation: Below normal speed for age/gender General Gait Details: pt with slow controlled gait to maintain oxygen saturation with 3 standing rest breaks. Pt required 4L of oxygen for gait for sats 90-92% with gait with 2 drops to 86% but pt able to self regulate and  stop to recover during those times  Stairs Stairs: Yes Stairs assistance: Modified independent (Device/Increase time) Stair Management: One rail Right;Alternating pattern;Forwards Number of Stairs: 3    Wheelchair Mobility    Modified Rankin (Stroke Patients Only)       Balance Overall balance assessment: Needs assistance   Sitting balance-Leahy Scale: Good       Standing balance-Leahy Scale: Good                               Pertinent Vitals/Pain Pain Assessment: No/denies pain    Home Living Family/patient expects to be discharged to:: Private residence Living Arrangements: Spouse/significant other Available Help at Discharge: Family;Available PRN/intermittently Type of Home: House Home Access: Stairs to enter Entrance Stairs-Rails: Right Entrance Stairs-Number of Steps: 5 Home Layout: One level Home Equipment: Walker - 4 wheels;Shower seat      Prior Function Level of Independence: Independent         Comments: pt has recently started using rollator when going out so she doesn't have to carry her oxygen     Hand Dominance        Extremity/Trunk Assessment   Upper Extremity Assessment: Overall WFL for tasks assessed           Lower Extremity Assessment: Overall WFL for tasks assessed      Cervical / Trunk Assessment: Kyphotic  Communication   Communication: No difficulties  Cognition Arousal/Alertness: Awake/alert Behavior During Therapy: [facility]  for tasks assessed/performed Overall Cognitive Status: Within Functional Limits for tasks assessed                      General Comments      Exercises        Assessment/Plan    PT Assessment Patent does not need any further PT services  PT Diagnosis Difficulty walking   PT Problem List    PT Treatment Interventions     PT Goals (Current goals can be found in the Care Plan section) Acute Rehab PT Goals PT Goal Formulation: All assessment and education complete, DC  therapy    Frequency     Barriers to discharge        Co-evaluation               End of Session Equipment Utilized During Treatment: Oxygen Activity Tolerance: Patient tolerated treatment well Patient left: in chair;with call bell/phone within reach;with family/visitor present Nurse Communication: Mobility status    Functional Assessment Tool Used: clinical judgement Functional Limitation: Mobility: Walking and moving around Mobility: Walking and Moving Around Current Status (Z6109): At least 1 percent but less than 20 percent impaired, limited or restricted Mobility: Walking and Moving Around Goal Status [phone]): At least 1 percent but less than 20 percent impaired, limited or restricted Mobility: Walking and Moving Around Discharge Status [phone]): At least 1 percent but less than 20 percent impaired, limited or restricted    Time: 9147-8295 PT Time Calculation (min) (ACUTE ONLY): 20 min   Charges:   PT Evaluation $Initial PT Evaluation Tier I: 1 Procedure     PT G Codes:   PT G-Codes **NOT FOR INPATIENT CLASS** Functional Assessment Tool Used: clinical judgement Functional Limitation: Mobility: Walking and moving around Mobility: Walking and Moving Around Current Status (A2130): At least 1 percent but less than 20 percent impaired, limited or restricted Mobility: Walking and Moving Around Goal Status [phone]): At least 1 percent but less than 20 percent impaired, limited or restricted Mobility: Walking and Moving Around Discharge Status [phone]): At least 1 percent but less than 20 percent impaired, limited or restricted    Delorse Lek 05/01/2015, 2:01 PM Delaney Meigs, PT [phone]

## 2015-05-01 NOTE — Consult Note (Addendum)
Name: Tonya [name] MRN: [id number] DOB: 29-Jun-1946    ADMISSION DATE:  04/30/2015 CONSULTATION DATE:  9/26  REFERRING MD :  [facility]   CHIEF COMPLAINT:  Dyspnea   BRIEF PATIENT DESCRIPTION:  69yo female never smoker with hx HTN, bronchiectasis on home O2, GERD presented 9/25 with 2-3 day hx increased SOB and fatigue.  Admitted by [facility] and PCCM consulted.   SIGNIFICANT EVENTS    STUDIES:     HISTORY OF PRESENT ILLNESS:   69yo female never smoker with hx HTN, bronchiectasis, GERD presented 9/25 with 2-3 day hx increased SOB and fatigue.  Admitted by [facility] and PCCM consulted.  Dyspnea has been worsening over last several weeks, acutely worse in last 2-3 days.  SHe just received her vest and has been using this, wonders if this tired her out. Reports cough with minimal yellow sputum. Her husband has Parkinson's, she was just given a walker but is having difficulty using his  PAST MEDICAL HISTORY :   has a past medical history of Bronchiectasis; Hypertension; and GERD (gastroesophageal reflux disease).  has no past surgical history on file. Prior to Admission medications   Medication Sig Start Date End Date Taking? Authorizing Provider  aspirin EC 81 MG tablet Take 81 mg by mouth at bedtime.   Yes Historical Provider, MD  azithromycin (ZITHROMAX) 250 MG tablet Take 2 tablets today then 1 tablet daily until gone Patient taking differently: Take 250-500 mg by mouth See admin instructions. Take as needed for colored mucus:  Take 2 tablets (500 mg) by mouth 1st day, then take 1 tablet (250 mg) daily on days 2-5 03/22/15  Yes Nyoka Cowden, MD  b complex vitamins tablet Take 1 tablet by mouth daily.    Yes Historical Provider, MD  budesonide-formoterol (SYMBICORT) 160-4.5 MCG/ACT inhaler Take 2 puffs first thing in am and then another 2 puffs about 12 hours later. Patient taking differently: Inhale 2 puffs into the lungs every 12 (twelve) hours.  03/22/15  Yes Nyoka Cowden, MD  Calcium  Carb-Cholecalciferol (CALCIUM PLUS VITAMIN D3 PO) Take 1 tablet by mouth daily.    Yes Historical Provider, MD  Dextromethorphan-Guaifenesin (MUCINEX DM MAXIMUM STRENGTH) 60-1200 MG TB12 Take 1 tablet by mouth 2 (two) times daily as needed (congestion/cough).   Yes Historical Provider, MD  famotidine (PEPCID) 20 MG tablet Take 20 mg by mouth at bedtime.   Yes Historical Provider, MD  fluticasone (FLONASE) 50 MCG/ACT nasal spray USE 1 TO 2 SPRAYS IN EACH NOSTRIL IN THE MORNING AND AT BEDTIME Patient taking differently: USE 1 TO 2 SPRAYS IN EACH NOSTRIL IN THE MORNING  AS NEEDED FOR CONGESTION  AND EVERY NIGHT AT BEDTIME 12/28/14  Yes Nyoka Cowden, MD  levalbuterol [facility] HFA) 45 MCG/ACT inhaler Inhale 1-2 puffs into the lungs every 4 (four) hours as needed for wheezing. Patient taking differently: Inhale 2 puffs into the lungs every 4 (four) hours as needed for wheezing or shortness of breath.  03/22/15  Yes Nyoka Cowden, MD  losartan-hydrochlorothiazide (HYZAAR) 100-12.5 MG per tablet Take 1 tablet by mouth daily.    Yes Historical Provider, MD  Multiple Vitamin (MULTIVITAMIN WITH MINERALS) TABS tablet Take 1 tablet by mouth at bedtime.   Yes Historical Provider, MD  OXYGEN Inhale 2 L into the lungs at bedtime.   Yes Historical Provider, MD  OXYGEN Use 3L of O2 with activity as needed   Yes Historical Provider, MD  pantoprazole (PROTONIX) 40 MG tablet Take 30-  60 min before your first and last meals of the day Patient taking differently: Take 40 mg by mouth daily before breakfast. Take 30- 60 min before breakfast every morning 08/26/12  Yes Nyoka Cowden, MD  Respiratory Therapy Supplies (FLUTTER) DEVI Use several times a day as needed for thick mucus, congestion, cough Patient taking differently: Use every hour as needed for thick mucus, congestion, and cough 02/18/11  Yes Tammy S Parrett, NP  benzonatate (TESSALON) 200 MG capsule TAKE 1 CAPSULE (200 MG TOTAL) BY MOUTH 3 (THREE) TIMES DAILY AS  NEEDED FOR COUGH. Patient not taking: Reported on 03/22/2015 03/31/14   Nyoka Cowden, MD  dextromethorphan-guaiFENesin [facility] DM) 30-600 MG per 12 hr tablet Take 1-2 tablets by mouth 2 (two) times daily as needed (for thick mucus, congestion, cough). For cough and congestion Patient not taking: Reported on 04/30/2015 02/18/11   Julio Sicks, NP  oxymetazoline (AFRIN) 0.05 % nasal spray 2 puffs twice daily for five days and as needed for nasal congestion Patient not taking: Reported on 04/30/2015 02/18/11   Julio Sicks, NP   Allergies  Allergen Reactions  . Alprazolam Other (See Comments)    REACTION: "I just feel funny"  . Metoclopramide Hcl Other (See Comments)    Either swelling or funny feeling  . Tramadol     REACTION: made pt "feel funny"  . Doxycycline Swelling and Rash     rash, redness in face, slight swelling in lips  . Levaquin [Levofloxacin] Swelling and Rash     redness in face, rash, slight swelling in lips  . Moxifloxacin Swelling and Rash    Slight swelling in lips  . Penicillins Rash    Has patient had a PCN reaction causing immediate rash, facial/tongue/throat swelling, SOB or lightheadedness with hypotension: Yes Has patient had a PCN reaction causing severe rash involving mucus membranes or skin necrosis: No Has patient had a PCN reaction that required hospitalization No Has patient had a PCN reaction occurring within the last 10 years: No If all of the above answers are "NO", then may proceed with Cephalosporin use.  . Sulfonamide Derivatives Swelling and Rash    Slight swelling in lips    FAMILY HISTORY:  family history includes Emphysema in her mother; Lung cancer in her father. SOCIAL HISTORY:  reports that she has never smoked. She has never used smokeless tobacco. She reports that she does not drink alcohol.  REVIEW OF SYSTEMS:   As per HPI - All other systems reviewed and were neg.    SUBJECTIVE:   VITAL SIGNS: Temp:  [97.6 F (36.4 C)-98 F  (36.7 C)] 98 F (36.7 C) (09/26 0533) Pulse Rate:  [76-101] 76 (09/26 0533) Resp:  [18-37] 18 (09/26 0533) BP: (98-159)/(48-73) 110/48 mmHg (09/26 0533) SpO2:  [89 %-100 %] 94 % (09/26 0533) Weight:  [103 lb 13.4 oz (47.1 kg)] 103 lb 13.4 oz (47.1 kg) (09/25 2353)  PHYSICAL EXAMINATION: Gen. Pleasant, well-nourished, in no distress, normal affect ENT - no lesions, no post nasal drip Neck: No JVD, no thyromegaly, no carotid bruits Lungs: no use of accessory muscles, no dullness to percussion, bibasal rales, no rhonchi  Cardiovascular: Rhythm regular, heart sounds  normal, no murmurs, no peripheral edema Abdomen: soft and non-tender, no hepatosplenomegaly, BS normal. Musculoskeletal: No deformities, no cyanosis or clubbing Neuro:  alert, non focal Skin:  Warm, no lesions/ rash    Recent Labs Lab 04/30/15 1713  NA 142  K 3.6  CL 101  CO2 32  BUN 14  CREATININE 0.88  GLUCOSE 108*    Recent Labs Lab 04/30/15 1713  HGB 15.0  HCT 45.3  WBC 11.2*  PLT 183   Dg Chest 2 View  04/30/2015   CLINICAL DATA:  Patient with shortness of breath for multiple months.  EXAM: CHEST  2 VIEW  COMPARISON:  Chest radiograph 12/30/2014  FINDINGS: Stable cardiac and mediastinal contours. No consolidative pulmonary opacities. No pleural effusion or pneumothorax. Lungs are hyperexpanded. Thoracic spine degenerative changes.  IMPRESSION: Pulmonary hyperinflation.  No acute cardiopulmonary process.   Electronically Signed   By: Annia Belt M.D.   On: 04/30/2015 17:50   08/04/14:  Aspergillus fumigatus =0.24 (H)  Serum IgE =83   HRCT 07/22/14>>> 1. Widespread central predominant cylindrical and varicose  bronchiectasis. This pattern of bronchiectasis suggests underlying  allergic bronchopulmonary aspergillosis.  2. Scattered areas of peribronchovascular ground-glass attenuation,  in addition to micronodularity and micronodularity, most of which  likely reflects mucoid impaction within terminal  bronchioles.  3. However, there is a peripheral area of airspace consolidation in  the left lower lobe, concerning for acute infection.  4. Extensive air trapping, indicative of small airways disease.  5. Atherosclerosis, including 3 vessel coronary artery disease.   ASSESSMENT / PLAN:  AECOPD /Bronchiectasis flare - previous HRCT suggestive of ABPA.  Aspergillus and serum IgE positive at the time but reportedly no significant clinical s/s to suggest.  Eosinophils wnl today.  CXR ess neg.   REC -  Sputum for culture if able  Cont systemic steroids  Continue nebulized BD's - suspect she may need to transition to nebs for maintenance  Max PPI  Supplemental O2  Pulmonary hygiene  ?recheck IgE Consider f/u CT if no improvement    Dirk Dress, NP 05/01/2015  9:08 AM Pager: [phone] or [phone]  ATTENDING NOTE: I have personally reviewed patient's available data, including medical history, events of note, physical examination and test results as part of my evaluation.  Workup so far for bronchiectasis in this never smoker-  PFT's 05/27/2012 FEV1 0.67 (37%) Ratio 36 and no better p B2 DLCO 71 CT chest/HRCT 07/22/14 >>Widespread central predominant cylindrical and varicose bronchiectasis.  Scattered areas of peribronchovascular ground-glass attenuation, in addition to micronodularity and micronodularity, most of whichlikely reflects mucoid impaction within terminal bronchioles. However, there is a peripheral area of airspace consolidation inthe left lower lobe, concerning for acute infection.Marland Kitchen Extensive air trapping, indicative of small airways disease  Allergy screen 08/04/2014 > Eos 3.1 % IgE 83 mild Pos aspergillus   She reports improvement with humidity and while in the shower, the Vest does not seems to have worked for her, she has minimal sputum production, she denies ever wheezing  Exam again shows bibasal crackles no wheezes  Not convinced this is ABPA-  have to consider MAC in this age group Recommend-will continue IV steroids for another 24 hours then transitioned to oral, will taper duo nebs 4 times/day. Would consider adding saline nebs based on symptomatic benefit this may have also help with secretion clearance. Home health care referral [facility] per NP/medical resident whose note is outlined above and that I agree with and edited in full.   Oretha Milch MD 230 [phone]

## 2015-05-01 NOTE — Telephone Encounter (Signed)
lmtcb x2 

## 2015-05-01 NOTE — Progress Notes (Signed)
[facility] PROGRESS NOTE  Tonya [name] ZOX:[id number] DOB: 07/29/1946 DOA: 04/30/2015  PCP:  Duane Lope, MD  Brief HPI: 69 year old Caucasian female with a past medical history of COPD, bronchiectasis, essential hypertension presented with progressively worsening shortness of breath. She also had generalized weakness. Patient was admitted to the hospital for further management of COPD exacerbation and bronchiectasis.  Past medical history:  Past Medical History  Diagnosis Date  . Bronchiectasis   . Hypertension   . GERD (gastroesophageal reflux disease)     Consultants: Pulmonology  Procedures: None  Antibiotics: None currently  Subjective: Patient feels slightly better this morning, although still quite short of breath. Denies any chest pain. No palpitations. No nausea, vomiting. Cough has minimal amount of yellow sputum.  Objective: Vital Signs  Filed Vitals:   04/30/15 2353 05/01/15 0152 05/01/15 0533 05/01/15 0922  BP:   110/48   Pulse: 98 100 76 80  Temp:   98 F (36.7 C)   TempSrc:   Oral   Resp:  Height:  (1.549 m)     Weight: 47.1 kg (103 lb 13.4 oz)     SpO2: 92%  94% 93%   No intake or output data in the 24 hours ending 05/01/15 1020 Filed Weights   04/30/15 2353  Weight: 47.1 kg (103 lb 13.4 oz)    General appearance: alert, cooperative, appears stated age and no distress Resp: Coarse breath sounds bilaterally with a few scattered crackles. No wheezing. Cardio: regular rate and rhythm, S1, S2 normal, no murmur, click, rub or gallop GI: soft, non-tender; bowel sounds normal; no masses,  no organomegaly Extremities: extremities normal, atraumatic, no cyanosis or edema Neurologic: No focal deficits  Lab Results:  Basic Metabolic Panel:  Recent Labs Lab 04/30/15 1713  NA 142  K 3.6  CL 101  CO2 32  GLUCOSE 108*  BUN 14  CREATININE 0.88  CALCIUM 9.5  MG 2.1  PHOS 3.6   Liver Function Tests:  Recent  Labs Lab 04/30/15 1713  AST 28  ALT 20  ALKPHOS 69  BILITOT 0.5  PROT 6.8  ALBUMIN 3.9   CBC:  Recent Labs Lab 04/30/15 1713  WBC 11.2*  NEUTROABS 6.1  HGB 15.0  HCT 45.3  MCV 90.8  PLT 183   Cardiac Enzymes:  Recent Labs Lab 04/30/15 1713  TROPONINI <0.03    Studies/Results: Dg Chest 2 View  04/30/2015   CLINICAL DATA:  Patient with shortness of breath for multiple months.  EXAM: CHEST  2 VIEW  COMPARISON:  Chest radiograph 12/30/2014  FINDINGS: Stable cardiac and mediastinal contours. No consolidative pulmonary opacities. No pleural effusion or pneumothorax. Lungs are hyperexpanded. Thoracic spine degenerative changes.  IMPRESSION: Pulmonary hyperinflation.  No acute cardiopulmonary process.   Electronically Signed   By: Annia Belt M.D.   On: 04/30/2015 17:50    Medications:  Scheduled: . aspirin  81 mg Oral QPM  . enoxaparin (LOVENOX) injection  30 mg Subcutaneous Q24H  . famotidine  20 mg Oral QHS  . feeding supplement (ENSURE ENLIVE)  237 mL Oral BID BM  . losartan  100 mg Oral Daily   And  . hydrochlorothiazide  12.5 mg Oral Daily  . ipratropium  0.5 mg Nebulization Q6H  . levalbuterol  0.63 mg Nebulization Q6H  . methylPREDNISolone (SOLU-MEDROL) injection  40 mg Intravenous Q12H  . multivitamin with minerals  1 tablet Oral QPM  . pantoprazole  40 mg Oral Daily  Continuous: . 0.9 % NaCl with KCl 40 mEq / L 50 mL/hr (05/01/15 0025)   QMV:[id number], ondansetron **OR** ondansetron (ZOFRAN) IV  Assessment/Plan:  Principal Problem:   COPD exacerbation Active Problems:   Bronchiectasis/COPD   GERD   Chronic rhinitis   Hypertension   Dehydration   Anxiety    Dyspnea Thought to be secondary to COPD exacerbation and bronchiectasis. Patient is currently on steroids, oxygen, nebulizer treatments. Has not yet been started on antibiotics. Pulmonology has seen the patient. Will defer further management to them. Patient feels slightly  better this morning.  History of essential hypertension Continue to monitor blood pressure. Continue home medications.  History of COPD As above  History of GERD Continue PPI  Dehydration Gentle IV hydration  Acute anxiety state Feels less anxious this morning. Continue to monitor.  DVT Prophylaxis: Lovenox    Code Status: Full code  Family Communication: Discussed with the patient and her daughter  Disposition Plan: Continue current management.      [facility]  [facility] Pager [phone] 05/01/2015, 10:20 AM  If 7PM-7AM, please contact night-coverage at [email or web address], password [facility]

## 2015-05-01 NOTE — Progress Notes (Signed)
Initial Nutrition Assessment  DOCUMENTATION CODES:   Non-severe (moderate) malnutrition in context of chronic illness  INTERVENTION:   -Continue Ensure Enlive po BID, each supplement provides 350 kcal and 20 grams of protein -Continue MVI daily  NUTRITION DIAGNOSIS:   Malnutrition related to chronic illness as evidenced by mild depletion of muscle mass, mild depletion of body fat, energy intake < or equal to 75% for > or equal to 1 month.  GOAL:   Patient will meet greater than or equal to 90% of their needs  MONITOR:   PO intake, Supplement acceptance, Labs, Weight trends, Skin, I & O's  REASON FOR ASSESSMENT:   Malnutrition Screening Tool    ASSESSMENT:   Tonya [name] is a 69 y.o. female with a past medical history hypertension, COPD/bronchiectasis, GERD who comes to the ER due to complaints of progressively worse shortness of breath for several weeks, but it has gotten worse over the last 2-3 days to the point that the patient was getting dyspneic with very minimal of her at home. She usually helps take care of her husband, but has been so fatigued that has been unable to do her regular activities over the past 2 days. She denies fever, chills, but feels very fatigued. She has a productive yellowish sputum cough, but states that this has not changed in quality or quantity. She denies hemoptysis, pleuritic chest pain, rhonchi, but has occasional wheezing. She denies left-sided chest pain, palpitations, diaphoresis, dizziness, or pitting edema of the lower extremities.  Pt admitted with COPD exacerbation.   Hx obtained by pt, pt daughter, and pt husband at bedside. All confirm pt with chronically poor appetite. Pt reports she does not have difficulty eating, but eats very little at a sitting. Typical meal intake consists of cereal with fruit, 1/2 sandwich with fruit, and dinner, of a meat, starch, and vegetable. Beverages consist of tea, soda, and water. Pt reveals that she  was recently consuming one [employer] supplement  Daily at home (355 kcals and 14.8 grams of protein), however, discontinued it "because it was too thick and sweet". Pt consumed an entire bottle of Ensure earlier today. Pt reports she prefers the Ensure supplement and would be amenable to continue supplement during hospitalization and at discharge. Also discussed ways to increase calories and protein in diet.  Pt reports ongoing weight loss over the past year. Noted an 8.8% wt loss x 1 year. Pt is small framed and reports she has always been petite; UBW between 115-120#.   Nutrition-Focused physical exam completed. Findings are mild to moderate fat depletion, mild muscle depletion, and no edema.   Labs reviewed.  Diet Order:  Diet Heart Room service appropriate?: Yes; Fluid consistency:: Thin  Skin:  Reviewed, no issues  Last BM:  05/01/15  Height:   Ht Readings from Last 1 Encounters:  04/30/15  (1.549 m)    Weight:   Wt Readings from Last 1 Encounters:  04/30/15 103 lb 13.4 oz (47.1 kg)    Ideal Body Weight:  47.7 kg  BMI:  Body mass index is 19.63 kg/(m^2).  Estimated Nutritional Needs:   Kcal:  1400-1600  Protein:  55-70 grams  Fluid:  1.4-1.6 L  EDUCATION NEEDS:   Education needs addressed  Jenifer A. Mayford Knife, RD, LDN, CDE Pager: [phone] After hours Pager: [phone]

## 2015-05-02 DIAGNOSIS — J471 Bronchiectasis with (acute) exacerbation: Secondary | ICD-10-CM

## 2015-05-02 DIAGNOSIS — I10 Essential (primary) hypertension: Secondary | ICD-10-CM

## 2015-05-02 DIAGNOSIS — F419 Anxiety disorder, unspecified: Secondary | ICD-10-CM | POA: Diagnosis not present

## 2015-05-02 DIAGNOSIS — J441 Chronic obstructive pulmonary disease with (acute) exacerbation: Secondary | ICD-10-CM | POA: Diagnosis not present

## 2015-05-02 LAB — BASIC METABOLIC PANEL
ANION GAP: 6 (ref 5–15)
BUN: 17 mg/dL (ref 6–20)
CO2: 31 mmol/L (ref 22–32)
Calcium: 8.7 mg/dL — ABNORMAL LOW (ref 8.9–10.3)
Chloride: 106 mmol/L (ref 101–111)
Creatinine, Ser: 0.6 mg/dL (ref 0.44–1.00)
GFR calc Af Amer: >60 mL/min (ref >60)
GFR calc non Af Amer: >60 mL/min (ref >60)
GLUCOSE: 130 mg/dL — AB (ref 65–99)
POTASSIUM: 4.5 mmol/L (ref 3.5–5.1)
Sodium: 143 mmol/L (ref 135–145)

## 2015-05-02 LAB — CBC
HEMATOCRIT: 42.5 % (ref 36.0–46.0)
Hemoglobin: 13.9 g/dL (ref 12.0–15.0)
MCH: 30 pg (ref 26.0–34.0)
MCHC: 32.7 g/dL (ref 30.0–36.0)
MCV: 91.6 fL (ref 78.0–100.0)
Platelets: 173 10*3/uL (ref 150–400)
RBC: 4.64 MIL/uL (ref 3.87–5.11)
RDW: 13.4 % (ref 11.5–15.5)
WBC: 16.7 10*3/uL — AB (ref 4.0–10.5)

## 2015-05-02 MED ORDER — PREDNISONE 20 MG PO TABS
40.0000 mg | ORAL_TABLET | Freq: Every day | ORAL | Status: DC
  Administered 2015-05-03: 40 mg via ORAL
  Filled 2015-05-02: qty 2

## 2015-05-02 MED ORDER — CEFUROXIME AXETIL 250 MG PO TABS
250.0000 mg | ORAL_TABLET | Freq: Two times a day (BID) | ORAL | Status: DC
  Administered 2015-05-02 – 2015-05-03 (×3): 250 mg via ORAL
  Filled 2015-05-02 (×5): qty 1

## 2015-05-02 NOTE — Telephone Encounter (Signed)
OV note with addendum faxed to [facility] with [employer] at [phone].  Marquita aware and voiced no further questions or concerns at this time.

## 2015-05-02 NOTE — Progress Notes (Signed)
[facility] PROGRESS NOTE  NEELA ZECCA WUJ:[id number] DOB: 24-Oct-1945 DOA: 04/30/2015  PCP:  Duane Lope, MD  Brief HPI: 69 year old Caucasian female with a past medical history of COPD, bronchiectasis, essential hypertension presented with progressively worsening shortness of breath. She also had generalized weakness. Patient was admitted to the hospital for further management of COPD exacerbation and bronchiectasis.  Past medical history:  Past Medical History  Diagnosis Date  . Bronchiectasis   . Hypertension   . GERD (gastroesophageal reflux disease)     Consultants: Pulmonology  Procedures: None  Antibiotics: None currently  Subjective: Patient continues to feel better. Breathing is improved. Cough is better. No chest pain.  Objective: Vital Signs  Filed Vitals:   05/01/15 2115 05/01/15 2122 05/02/15 0229 05/02/15 0528  BP:  97/83  118/60  Pulse: 100 88 92 69  Temp:  98.5 F (36.9 C)  97.7 F (36.5 C)  TempSrc:  Oral  Oral  Resp: Height:      Weight:      SpO2:  98%  97%    Intake/Output Summary (Last 24 hours) at 05/02/15 0724 Last data filed at 05/01/15 2224  Gross per 24 hour  Intake    560 ml  Output   1500 ml  Net   -940 ml   Filed Weights   04/30/15 2353  Weight: 47.1 kg (103 lb 13.4 oz)    General appearance: alert, cooperative, appears stated age and no distress Resp: Coarse breath sounds bilaterally with a few scattered crackles. No wheezing. Improved air entry Cardio: regular rate and rhythm, S1, S2 normal, no murmur, click, rub or gallop GI: soft, non-tender; bowel sounds normal; no masses,  no organomegaly Neurologic: No focal deficits  Lab Results:  Basic Metabolic Panel:  Recent Labs Lab 04/30/15 1713 05/02/15 0508  NA 142 143  K 3.6 4.5  CL 101 106  CO2 32 31  GLUCOSE 108* 130*  BUN 14 17  CREATININE 0.88 0.60  CALCIUM 9.5 8.7*  MG 2.1  --   PHOS 3.6  --    Liver Function Tests:  Recent  Labs Lab 04/30/15 1713  AST 28  ALT 20  ALKPHOS 69  BILITOT 0.5  PROT 6.8  ALBUMIN 3.9   CBC:  Recent Labs Lab 04/30/15 1713 05/02/15 0508  WBC 11.2* 16.7*  NEUTROABS 6.1  --   HGB 15.0 13.9  HCT 45.3 42.5  MCV 90.8 91.6  PLT 183 173   Cardiac Enzymes:  Recent Labs Lab 04/30/15 1713  TROPONINI <0.03    Studies/Results: Dg Chest 2 View  04/30/2015   CLINICAL DATA:  Patient with shortness of breath for multiple months.  EXAM: CHEST  2 VIEW  COMPARISON:  Chest radiograph 12/30/2014  FINDINGS: Stable cardiac and mediastinal contours. No consolidative pulmonary opacities. No pleural effusion or pneumothorax. Lungs are hyperexpanded. Thoracic spine degenerative changes.  IMPRESSION: Pulmonary hyperinflation.  No acute cardiopulmonary process.   Electronically Signed   By: Annia Belt M.D.   On: 04/30/2015 17:50    Medications:  Scheduled: . aspirin  81 mg Oral QPM  . enoxaparin (LOVENOX) injection  30 mg Subcutaneous Q24H  . famotidine  20 mg Oral QHS  . feeding supplement (ENSURE ENLIVE)  237 mL Oral BID BM  . losartan  100 mg Oral Daily   And  . hydrochlorothiazide  12.5 mg Oral Daily  . ipratropium  0.5 mg Nebulization Q6H  . levalbuterol  0.63 mg Nebulization  Q6H  . methylPREDNISolone (SOLU-MEDROL) injection  40 mg Intravenous Q12H  . multivitamin with minerals  1 tablet Oral QPM  . pantoprazole  40 mg Oral Daily   Continuous: . 0.9 % NaCl with KCl 40 mEq / L 50 mL/hr (05/01/15 1709)   EAV:[id number], ondansetron **OR** ondansetron (ZOFRAN) IV, sodium chloride  Assessment/Plan:  Principal Problem:   COPD exacerbation Active Problems:   Bronchiectasis/COPD   GERD   Chronic rhinitis   Hypertension   Dehydration   Anxiety   Malnutrition of moderate degree    Dyspnea Thought to be secondary to COPD exacerbation and bronchiectasis. Patient is currently on steroids, oxygen, nebulizer treatments. Pulmonology is following. Continue  current management. Mobilize. PT and OT to evaluate.  History of essential hypertension Continue to monitor blood pressure. Continue home medications.  History of COPD As above  History of GERD Continue PPI  Dehydration Gentle IV hydration  Acute anxiety state Feels less anxious this morning. Continue to monitor.  DVT Prophylaxis: Lovenox    Code Status: Full code  Family Communication: Discussed with the patient Disposition Plan: Continue current management. Further plans per pulmonology.      [facility]  [facility] Pager [phone] 05/02/2015, 7:24 AM  If 7PM-7AM, please contact night-coverage at [email or web address], password [facility]

## 2015-05-02 NOTE — Progress Notes (Addendum)
Name: Tonya [name] MRN: [id number] DOB: May 12, 1946    ADMISSION DATE:  04/30/2015 CONSULTATION DATE:  9/26  REFERRING MD :  [facility]   CHIEF COMPLAINT:  Dyspnea   BRIEF PATIENT DESCRIPTION:  69yo female never smoker with hx HTN, bronchiectasis on home O2, GERD presented 9/25 with 2-3 day hx increased SOB and fatigue.  Admitted by [facility] and PCCM consulted.   SIGNIFICANT EVENTS    STUDIES:    SUBJECTIVE:  Tonya [name] notes that she is feeling better today, has not been out of bed or walked yet  VITAL SIGNS: Temp:  [97.7 F (36.5 C)-98.5 F (36.9 C)] 97.7 F (36.5 C) (09/27 0528) Pulse Rate:  [69-112] 69 (09/27 0528) Resp:  [16-20] 18 (09/27 0528) BP: (97-118)/(48-83) 118/60 mmHg (09/27 0528) SpO2:  [90 %-98 %] 94 % (09/27 0730)  PHYSICAL EXAMINATION: Gen. No distress in bed HEENT: NCAT EOMi PULM: Crackles in bases bilaterally, normal effort, no wheezing CV: RRR, no mgr GI : BS+, soft, nontender MSK: normal bulk and tone Derm: no rash or skin breakdown Neuro: A&OX4, moves all four ext well    Recent Labs Lab 04/30/15 1713 05/02/15 0508  NA 142 143  K 3.6 4.5  CL 101 106  CO2 32 31  BUN 14 17  CREATININE 0.88 0.60  GLUCOSE 108* 130*    Recent Labs Lab 04/30/15 1713 05/02/15 0508  HGB 15.0 13.9  HCT 45.3 42.5  WBC 11.2* 16.7*  PLT 183 173   Dg Chest 2 View  04/30/2015   CLINICAL DATA:  Patient with shortness of breath for multiple months.  EXAM: CHEST  2 VIEW  COMPARISON:  Chest radiograph 12/30/2014  FINDINGS: Stable cardiac and mediastinal contours. No consolidative pulmonary opacities. No pleural effusion or pneumothorax. Lungs are hyperexpanded. Thoracic spine degenerative changes.  IMPRESSION: Pulmonary hyperinflation.  No acute cardiopulmonary process.   Electronically Signed   By: Annia Belt M.D.   On: 04/30/2015 17:50   08/04/14:  Aspergillus fumigatus =0.24 (H)  Serum IgE =83   HRCT 07/22/14>>> 1. Widespread central predominant  cylindrical and varicose  bronchiectasis. This pattern of bronchiectasis suggests underlying  allergic bronchopulmonary aspergillosis.  2. Scattered areas of peribronchovascular ground-glass attenuation,  in addition to micronodularity and micronodularity, most of which  likely reflects mucoid impaction within terminal bronchioles.  3. However, there is a peripheral area of airspace consolidation in  the left lower lobe, concerning for acute infection.  4. Extensive air trapping, indicative of small airways disease.  5. Atherosclerosis, including 3 vessel coronary artery disease.   ASSESSMENT / PLAN:  AE COPD/Bronchiectasis flare - > it sounds like starting the vest may have precipitated her decline, but I suspect that is because she had a high chronic mucus burden.  She has been declining over the last year with weight loss and increasing dyspnea in the setting of severe airflow obstruction.  I agree that it's not clear if she has ABPA or not.  I think the acute problem is excessive mucus burden and inability to clear mucus which for > 90% of patient with bronchiectasis is an acute bacterial problem.  Moving forward: Taper prednisone as follows: Take  po daily for 3 days, then take  po daily for 3 days, then take  po daily for two days, then take  po daily for 2 days Add ceftin  po bid x7 days with a probiotic Use flutter valve 4-5 breaths, 4-5 times a day Check sputum culture and sensitivity Add nebulizer  at home for bronchodilator bid minimum, qid prn Gradually add back vest after several days of therapy targeted at reducing mucus burden (prednisone and doxy) Walk in hospital, assess O2 saturation while walking Resume symbicort at home Would consider work up for ABPA with allergy consult as outpatient> if we diagnosed this then she would benefit from chronic prednisone, but at this point I don't think that this is relevant enough at this point Would benefit from home  PT/OT I encouraged ensure with patient and her family  > 35 minutes spent in direct consultation as part of a 45 minute visit  Heber [place], MD [place] Pager: [phone] Cell: [phone] After 3pm or if no response, call [phone]

## 2015-05-03 DIAGNOSIS — J471 Bronchiectasis with (acute) exacerbation: Secondary | ICD-10-CM | POA: Diagnosis not present

## 2015-05-03 DIAGNOSIS — J441 Chronic obstructive pulmonary disease with (acute) exacerbation: Secondary | ICD-10-CM | POA: Diagnosis not present

## 2015-05-03 LAB — EXPECTORATED SPUTUM ASSESSMENT W GRAM STAIN, RFLX TO RESP C

## 2015-05-03 LAB — EXPECTORATED SPUTUM ASSESSMENT W REFEX TO RESP CULTURE: SPECIAL REQUESTS: NORMAL

## 2015-05-03 MED ORDER — ALBUTEROL SULFATE (2.5 MG/3ML) 0.083% IN NEBU: 2.5000 mg | INHALATION_SOLUTION | Freq: Four times a day (QID) | RESPIRATORY_TRACT | Status: DC | PRN

## 2015-05-03 MED ORDER — PREDNISONE 10 MG PO TABS: ORAL_TABLET | ORAL | Status: DC

## 2015-05-03 MED ORDER — CEFUROXIME AXETIL 250 MG PO TABS: 250.0000 mg | ORAL_TABLET | Freq: Two times a day (BID) | ORAL | Status: DC

## 2015-05-03 MED ORDER — SACCHAROMYCES BOULARDII 250 MG PO CAPS: 250.0000 mg | ORAL_CAPSULE | Freq: Two times a day (BID) | ORAL | Status: DC

## 2015-05-03 NOTE — Discharge Instructions (Signed)
Follow with Primary MD  Tonya Lope, MD in 7 days   Get CBC, CMP, 2 view Chest X ray checked  by Primary MD next visit.    Activity: As tolerated with Full fall precautions use walker/cane & assistance as needed   Disposition Home    Diet: Heart Healthy  , with feeding assistance and aspiration precautions.  For Heart failure patients - Check your Weight same time everyday, if you gain over 2 pounds, or you develop in leg swelling, experience more shortness of breath or chest pain, call your Primary MD immediately. Follow Cardiac Low Salt Diet and 1.5 lit/day fluid restriction.   On your next visit with your primary care physician please Get Medicines reviewed and adjusted.   Please request your Prim.MD to go over all Hospital Tests and Procedure/Radiological results at the follow up, please get all Hospital records sent to your Prim MD by signing hospital release before you go home.   If you experience worsening of your admission symptoms, develop shortness of breath, life threatening emergency, suicidal or homicidal thoughts you must seek medical attention immediately by calling 911 or calling your MD immediately  if symptoms less severe.  You Must read complete instructions/literature along with all the possible adverse reactions/side effects for all the Medicines you take and that have been prescribed to you. Take any new Medicines after you have completely understood and accpet all the possible adverse reactions/side effects.   Do not drive, operating heavy machinery, perform activities at heights, swimming or participation in water activities or provide baby sitting services if your were admitted for syncope or siezures until you have seen by Primary MD or a Neurologist and advised to do so again.  Do not drive when taking Pain medications.    Do not take more than prescribed Pain, Sleep and Anxiety Medications  Special Instructions: If you have smoked or chewed Tobacco  in the  last 2 yrs please stop smoking, stop any regular Alcohol  and or any Recreational drug use.  Wear Seat belts while driving.   Please note  You were cared for by a hospitalist during your hospital stay. If you have any questions about your discharge medications or the care you received while you were in the hospital after you are discharged, you can call the unit and asked to speak with the hospitalist on call if the hospitalist that took care of you is not available. Once you are discharged, your primary care physician will handle any further medical issues. Please note that NO REFILLS for any discharge medications will be authorized once you are discharged, as it is imperative that you return to your primary care physician (or establish a relationship with a primary care physician if you do not have one) for your aftercare needs so that they can reassess your need for medications and monitor your lab values.

## 2015-05-03 NOTE — Progress Notes (Signed)
Patient discharged to home with family. IV dc'd. Discharge education and instruction reviewed with family and patient and all questions addressed. Vitals stable for pt. 05/03/2015 3:34 PM Bowie,Sharlene

## 2015-05-03 NOTE — Progress Notes (Signed)
Microlab contacted RN and stated sputum culture contained too many epithelial cells and could not be cultured. Patient's primary RN Sharlene made aware.

## 2015-05-03 NOTE — Discharge Summary (Signed)
Tonya [name], is a 69 y.o. female  DOB 12-08-1945  MRN [id number].  Admission date:  04/30/2015  Admitting Physician  Bobette Mo, MD  Discharge Date:  05/03/2015   Primary MD   Duane Lope, MD  Recommendations for primary care physician for things to follow:  - Patient to have repeat labs, CBC, BMP, 2 view chest x-ray during next visit - Follow with pulmonary on 05/15/2015 for bronchiectasis    Admission Diagnosis  SOB   Discharge Diagnosis  SOB    Principal Problem:   COPD exacerbation Active Problems:   Bronchiectasis/COPD   GERD   Chronic rhinitis   Hypertension   Dehydration   Anxiety   Malnutrition of moderate degree      Past Medical History  Diagnosis Date  . Bronchiectasis   . Hypertension   . GERD (gastroesophageal reflux disease)     History reviewed. No pertinent past surgical history.     History of present illness and  Hospital Course:     Kindly see H&P for history of present illness and admission details, please review complete Labs, Consult reports and Test reports for all details in brief  HPI  from the history and physical done on the day of admission  Tonya [name] is a 69 y.o. female with a past medical history hypertension, COPD/bronchiectasis, GERD who comes to the ER due to complaints of progressively worse shortness of breath for several weeks, but it has gotten worse over the last 2-3 days to the point that the patient was getting dyspneic with very minimal of her at home. She usually helps take care of her husband, but has been so fatigued that has been unable to do her regular activities over the past 2 days. She denies fever, chills, but feels very fatigued. She has a productive yellowish sputum cough, but states that this has not changed in quality or quantity. She denies hemoptysis, pleuritic chest pain, rhonchi, but has occasional  wheezing. She denies left-sided chest pain, palpitations, diaphoresis, dizziness, or pitting edema of the lower extremities.  Workup in the ER has been unremarkable, except for hyperinflated lungs on chest x-ray. The case was discussed by the emergency department with pulmonology on call, who recommended for the patient to be admitted and they will be consulting in the morning.  Hospital Course   Dyspnea - secondary to bronchiectasis.    Bronchiectasis flare  - Pulmonary consult appreciated, this is most likely due to high chronic mucus burden, we'll discharge on steroids tapering dose as per pulmonary recommendation, to finish total of 7 days on Ceftin, flutter valve 4-5 days with a gradual transition back to harvest, albuterol when necessary, continue with home medication include Flonase and Symbicort, follow with lobar pulmonary as an outpatient, appointment scheduled on 05/15/2015.  History of essential hypertension  Continue home medications.   History of GERD Continue PPI  Dehydration Resolved with IV hydration  Acute anxiety state       Discharge Condition:  Stable   Follow  UP  Follow-up Information    Follow up with [facility].   Why:  nebulizer to be delivered to room prior to discharge   Contact information:   [address] [place] [address] [phone]       Follow up with [facility].   Why:  HH RN PT OT and HHA. Will call and schedule first home health visit in next 24 to 48 hours.   Contact information:   [address] [place] [address] [phone]       Follow up with [facility], NP. Call on 05/15/2015.   Specialty:  Nurse Practitioner   Why:  follow on 10/10 at 11:15 am   Contact information:   [address] [place] [address] [phone]       Follow up with  Duane Lope, MD. Call in 1 week.   Specialty:  Family Medicine   Why:  Posthospitalization follow-up   Contact  information:   [address] [place] [address] [phone]         Discharge Instructions  and  Discharge Medications     Discharge Instructions    Diet - low sodium heart healthy    Complete by:  As directed      Discharge instructions    Complete by:  As directed   Follow with Primary MD  Duane Lope, MD in 7 days   Get CBC, CMP, 2 view Chest X ray checked  by Primary MD next visit.    Activity: As tolerated with Full fall precautions use walker/cane & assistance as needed   Disposition Home **   Diet: Heart Healthy ** , with feeding assistance and aspiration precautions.  For Heart failure patients - Check your Weight same time everyday, if you gain over 2 pounds, or you develop in leg swelling, experience more shortness of breath or chest pain, call your Primary MD immediately. Follow Cardiac Low Salt Diet and 1.5 lit/day fluid restriction.   On your next visit with your primary care physician please Get Medicines reviewed and adjusted.   Please request your Prim.MD to go over all Hospital Tests and Procedure/Radiological results at the follow up, please get all Hospital records sent to your Prim MD by signing hospital release before you go home.   If you experience worsening of your admission symptoms, develop shortness of breath, life threatening emergency, suicidal or homicidal thoughts you must seek medical attention immediately by calling 911 or calling your MD immediately  if symptoms less severe.  You Must read complete instructions/literature along with all the possible adverse reactions/side effects for all the Medicines you take and that have been prescribed to you. Take any new Medicines after you have completely understood and accpet all the possible adverse reactions/side effects.   Do not drive, operating heavy machinery, perform activities at heights, swimming or participation in water activities or provide baby sitting services if your were  admitted for syncope or siezures until you have seen by Primary MD or a Neurologist and advised to do so again.  Do not drive when taking Pain medications.    Do not take more than prescribed Pain, Sleep and Anxiety Medications  Special Instructions: If you have smoked or chewed Tobacco  in the last 2 yrs please stop smoking, stop any regular Alcohol  and or any Recreational drug use.  Wear Seat belts while driving.   Please note  You were cared for by  a hospitalist during your hospital stay. If you have any questions about your discharge medications or the care you received while you were in the hospital after you are discharged, you can call the unit and asked to speak with the hospitalist on call if the hospitalist that took care of you is not available. Once you are discharged, your primary care physician will handle any further medical issues. Please note that NO REFILLS for any discharge medications will be authorized once you are discharged, as it is imperative that you return to your primary care physician (or establish a relationship with a primary care physician if you do not have one) for your aftercare needs so that they can reassess your need for medications and monitor your lab values.     Increase activity slowly    Complete by:  As directed             Medication List    STOP taking these medications        azithromycin 250 MG tablet  Commonly known as:  ZITHROMAX      TAKE these medications        albuterol (2.5 MG/3ML) 0.083% nebulizer solution  Commonly known as:  PROVENTIL  Take 3 mLs (2.5 mg total) by nebulization every 6 (six) hours as needed for wheezing or shortness of breath.     aspirin EC 81 MG tablet  Take 81 mg by mouth at bedtime.     b complex vitamins tablet  Take 1 tablet by mouth daily.     benzonatate 200 MG capsule  Commonly known as:  TESSALON  TAKE 1 CAPSULE (200 MG TOTAL) BY MOUTH 3 (THREE) TIMES DAILY AS NEEDED FOR COUGH.      budesonide-formoterol 160-4.5 MCG/ACT inhaler  Commonly known as:  SYMBICORT  Take 2 puffs first thing in am and then another 2 puffs about 12 hours later.     CALCIUM PLUS VITAMIN D3 PO  Take 1 tablet by mouth daily.     cefUROXime 250 MG tablet  Commonly known as:  CEFTIN  Take 1 tablet (250 mg total) by mouth 2 (two) times daily with a meal.     MUCINEX DM MAXIMUM STRENGTH 60-1200 MG Tb12  Take 1 tablet by mouth 2 (two) times daily as needed (congestion/cough).     dextromethorphan-guaiFENesin 30-600 MG 12hr tablet  Commonly known as:  MUCINEX DM  Take 1-2 tablets by mouth 2 (two) times daily as needed (for thick mucus, congestion, cough). For cough and congestion     famotidine 20 MG tablet  Commonly known as:  PEPCID  Take 20 mg by mouth at bedtime.     fluticasone 50 MCG/ACT nasal spray  Commonly known as:  FLONASE  USE 1 TO 2 SPRAYS IN EACH NOSTRIL IN THE MORNING AND AT BEDTIME     FLUTTER Devi  Use several times a day as needed for thick mucus, congestion, cough     levalbuterol 45 MCG/ACT inhaler  Commonly known as:  XOPENEX HFA  Inhale 1-2 puffs into the lungs every 4 (four) hours as needed for wheezing.     losartan-hydrochlorothiazide 100-12.5 MG tablet  Commonly known as:  HYZAAR  Take 1 tablet by mouth daily.     multivitamin with minerals Tabs tablet  Take 1 tablet by mouth at bedtime.     OXYGEN  Inhale 2 L into the lungs at bedtime.     OXYGEN  Use 3L of O2 with activity as needed  oxymetazoline 0.05 % nasal spray  Commonly known as:  AFRIN  2 puffs twice daily for five days and as needed for nasal congestion     pantoprazole 40 MG tablet  Commonly known as:  PROTONIX  Take 30- 60 min before your first and last meals of the day     predniSONE 10 MG tablet  Commonly known as:  DELTASONE  Take  po daily for 2 days, then take  po daily for 3 days, then take  po daily for two days, then take  po daily for 2 daysthen stop.      saccharomyces boulardii 250 MG capsule  Commonly known as:  FLORASTOR  Take 1 capsule (250 mg total) by mouth 2 (two) times daily.          Diet and Activity recommendation: See Discharge Instructions above   Consults obtained -  Pulmonary   Major procedures and Radiology Reports - PLEASE review detailed and final reports for all details, in brief -      Dg Chest 2 View  04/30/2015   CLINICAL DATA:  Patient with shortness of breath for multiple months.  EXAM: CHEST  2 VIEW  COMPARISON:  Chest radiograph 12/30/2014  FINDINGS: Stable cardiac and mediastinal contours. No consolidative pulmonary opacities. No pleural effusion or pneumothorax. Lungs are hyperexpanded. Thoracic spine degenerative changes.  IMPRESSION: Pulmonary hyperinflation.  No acute cardiopulmonary process.   Electronically Signed   By: Annia Belt M.D.   On: 04/30/2015 17:50    Micro Results     Recent Results (from the past 240 hour(s))  Culture, expectorated sputum-assessment     Status: None   Collection Time: 05/01/15  4:48 PM  Result Value Ref Range Status   Specimen Description SPUTUM  Final   Special Requests NONE  Final   Sputum evaluation   Final    CALLED TO L.HERBIN,RN AT 2255 BY L.PITT 05/01/15 MICROSCOPIC FINDINGS SUGGEST THAT THIS SPECIMEN IS NOT REPRESENTATIVE OF LOWER RESPIRATORY SECRETIONS. PLEASE RECOLLECT.    Report Status 05/01/2015 FINAL  Final       Today   Subjective:   Brigetta Beckstrom today has no headache,no chest abdominal pain,no new weakness tingling or numbness, feels much better wants to go home today.   Objective:   Blood pressure 127/70, pulse 61, temperature 97.9 F (36.6 C), temperature source Oral, resp. rate 20, height  (1.549 m), weight 47.1 kg (103 lb 13.4 oz), SpO2 92 %.   Intake/Output Summary (Last 24 hours) at 05/03/15 1131 Last data filed at 05/03/15 0750  Gross per 24 hour  Intake    240 ml  Output      0 ml  Net    240 ml    Exam Awake  Alert, Oriented x 3, No new F.N deficits, Normal affect Scott.AT,PERRAL Supple Neck,No JVD, No cervical lymphadenopathy appriciated.  Symmetrical Chest wall movement, Good air movement bilaterally, no wheezing RRR,No Gallops,Rubs or new Murmurs, No Parasternal Heave +ve B.Sounds, Abd Soft, Non tender, No organomegaly appriciated, No rebound -guarding or rigidity. No Cyanosis, Clubbing or edema, No new Rash or bruise  Data Review   CBC w Diff: Lab Results  Component Value Date   WBC 16.7* 05/02/2015   HGB 13.9 05/02/2015   HCT 42.5 05/02/2015   PLT 173 05/02/2015   LYMPHOPCT 31 04/30/2015   MONOPCT 10 04/30/2015   EOSPCT 4 04/30/2015   BASOPCT 0 04/30/2015    CMP: Lab Results  Component Value Date  NA 143 05/02/2015   K 4.5 05/02/2015   CL 106 05/02/2015   CO2 31 05/02/2015   BUN 17 05/02/2015   CREATININE 0.60 05/02/2015   PROT 6.8 04/30/2015   ALBUMIN 3.9 04/30/2015   BILITOT 0.5 04/30/2015   ALKPHOS 69 04/30/2015   AST 28 04/30/2015   ALT 20 04/30/2015  .   Total Time in preparing paper work, data evaluation and todays exam - 35 minutes  [name], DAWOOD M.D on 05/03/2015 at 11:31 AM  [facility]   Office  [phone]

## 2015-05-03 NOTE — Progress Notes (Signed)
Name: Tonya [name] MRN: [id number] DOB: February 24, 1946    ADMISSION DATE:  04/30/2015 CONSULTATION DATE:  9/26  REFERRING MD :  [facility]   CHIEF COMPLAINT:  Dyspnea   BRIEF PATIENT DESCRIPTION:  69yo female never smoker with hx HTN, bronchiectasis on home O2, GERD presented 9/25 with 2-3 day hx increased SOB and fatigue.  Admitted by [facility] and PCCM consulted.   SIGNIFICANT EVENTS    STUDIES:    SUBJECTIVE:  Reports continued improvement VITAL SIGNS: Temp:  [97.9 F (36.6 C)-98.4 F (36.9 C)] 97.9 F (36.6 C) (09/28 0542) Pulse Rate:  [61-83] 61 (09/28 0542) Resp:  [16-20] 20 (09/28 0542) BP: (121-141)/(56-84) 127/70 mmHg (09/28 0542) SpO2:  [92 %-100 %] 92 % (09/28 0739)  PHYSICAL EXAMINATION: Gen. No distress in bed, currently on O2 HEENT: NCAT EOMi PULM:No wheezes, decreased bs bases CV: RRR, no mgr GI : BS+, soft, nontender MSK: normal bulk and tone Derm: no rash or skin breakdown Neuro: A&OX4, moves all four ext well    Recent Labs Lab 04/30/15 1713 05/02/15 0508  NA 142 143  K 3.6 4.5  CL 101 106  CO2 32 31  BUN 14 17  CREATININE 0.88 0.60  GLUCOSE 108* 130*    Recent Labs Lab 04/30/15 1713 05/02/15 0508  HGB 15.0 13.9  HCT 45.3 42.5  WBC 11.2* 16.7*  PLT 183 173   No results found. 08/04/14:  Aspergillus fumigatus =0.24 (H)  Serum IgE =83   HRCT 07/22/14>>> 1. Widespread central predominant cylindrical and varicose  bronchiectasis. This pattern of bronchiectasis suggests underlying  allergic bronchopulmonary aspergillosis.  2. Scattered areas of peribronchovascular ground-glass attenuation,  in addition to micronodularity and micronodularity, most of which  likely reflects mucoid impaction within terminal bronchioles.  3. However, there is a peripheral area of airspace consolidation in  the left lower lobe, concerning for acute infection.  4. Extensive air trapping, indicative of small airways disease.  5. Atherosclerosis, including  3 vessel coronary artery disease.   ASSESSMENT / PLAN:  AE COPD/Bronchiectasis flare - > it sounds like starting the vest may have precipitated her decline, but I suspect that is because she had a high chronic mucus burden.  She has been declining over the last year with weight loss and increasing dyspnea in the setting of severe airflow obstruction.  I agree that it's not clear if she has ABPA or not.  I think the acute problem is excessive mucus burden and inability to clear mucus which for > 90% of patient with bronchiectasis is an acute bacterial problem.  Moving forward: Taper prednisone as follows: Take  po daily for 3 days, then take  po daily for 3 days, then take  po daily for two days, then take  po daily for 2 days Plan ceftin  po bid x7 days with a probiotic Use flutter valve 4-5 breaths, 4-5 times a day. Shown how to use nebulizer via flutter valve. Check sputum culture and sensitivity Add nebulizer at home for bronchodilator bid minimum, qid prn Gradually add back vest after several days of therapy targeted at reducing mucus burden (prednisone and ceftin) Walk in hospital, assess O2 saturation while walking Resume symbicort at home Would consider work up for ABPA with allergy consult as outpatient> if we diagnosed this then she would benefit from chronic prednisone, but at this point I don't think that this is relevant enough at this point Would benefit from home PT/OT Encouraged ensure with patient and her family Nearing  MHB 9/28  Steve Minor ACNP [facility] Pager [phone] till 3 pm If no answer page [phone] 05/03/2015, 9:36 AM    Attending:  I have seen and examined the patient with nurse practitioner/resident and agree with the note above.   Ms. Tonya [name] is feeling better today, walked around last night Lungs with improved air movement today, no wheezing  Plan: D/c home with albuterol nebulizer to use bid mandadotory, up to qid prn  dyspnea Prednisone taper as above ceftin as above Flutter for 5 days, then re-introduce therapy vest for 5 minutes a day, gradually increase until back to 30 min bid  F/u with Rubye Oaks NP in our office on 05-15-15 11:15 AM  Heber Winters, MD [place] [facility] Pager: [phone] Cell: [phone] After 3pm or if no response, call [phone]

## 2015-05-10 ENCOUNTER — Telehealth: Payer: Self-pay | Admitting: Adult Health

## 2015-05-10 DIAGNOSIS — J479 Bronchiectasis, uncomplicated: Secondary | ICD-10-CM

## 2015-05-10 NOTE — Telephone Encounter (Signed)
Spoke with Amy with [facility] [employer]  She states that [employer] is not contracted with pt's [employer] HMO She states that we will need to order vest from [facility] or [employer]  These 2 companies provide a similar vest called [employer]lo  MW, please advise if okay to order Afflo vest through a different company, thanks

## 2015-05-10 NOTE — Telephone Encounter (Signed)
Fine with me

## 2015-05-10 NOTE — Telephone Encounter (Signed)
Left message with Amy to call office back

## 2015-05-11 NOTE — Telephone Encounter (Signed)
lmtcb X2 for Amy

## 2015-05-11 NOTE — Telephone Encounter (Signed)
Amy returning call and can be reached @ [phone].Caren Griffins

## 2015-05-11 NOTE — Telephone Encounter (Signed)
[employer] [facility]

## 2015-05-11 NOTE — Telephone Encounter (Signed)
lmtcb x1 for Amy. 

## 2015-05-11 NOTE — Telephone Encounter (Signed)
Spoke with Clarisa Fling at [facility] (direct # [phone]), advised that provider is fine with switching to a covered vibratory vest for the patient.  A new order will need to be sent in specifying which provider for the vest is requested.   MW do you have a preference for the vest provider?  [facility] or [employer] are the options.  Thanks!

## 2015-05-12 NOTE — Telephone Encounter (Signed)
Called and spoke to [place]. Informed her of the preferred vendor per MW. Clarisa Fling requested a new order be placed for [facility]. Clarisa Fling also stated a denial letter will be sent to [place] because the order will not be processed through [facility] but rather [facility]. New order placed for [employer] [facility] as the vendor for the vibratory vest.   Will forward to [place] as FYI.

## 2015-05-15 ENCOUNTER — Telehealth: Payer: Self-pay | Admitting: Internal Medicine

## 2015-05-15 ENCOUNTER — Ambulatory Visit (INDEPENDENT_AMBULATORY_CARE_PROVIDER_SITE_OTHER): Payer: Medicare Other | Admitting: Adult Health

## 2015-05-15 ENCOUNTER — Encounter: Payer: Self-pay | Admitting: Adult Health

## 2015-05-15 VITALS — BP 98/66 | HR 98 | Temp 98.3°F | Ht 61.0 in | Wt 101.4 lb

## 2015-05-15 DIAGNOSIS — J310 Chronic rhinitis: Secondary | ICD-10-CM

## 2015-05-15 DIAGNOSIS — J9612 Chronic respiratory failure with hypercapnia: Secondary | ICD-10-CM | POA: Diagnosis not present

## 2015-05-15 DIAGNOSIS — Z23 Encounter for immunization: Secondary | ICD-10-CM

## 2015-05-15 DIAGNOSIS — J471 Bronchiectasis with (acute) exacerbation: Secondary | ICD-10-CM | POA: Diagnosis not present

## 2015-05-15 NOTE — Telephone Encounter (Signed)
Attempted to call Office closed Will call back tomorrow

## 2015-05-15 NOTE — Telephone Encounter (Signed)
Pt has pending appt with [facility] on 11.22.2016. Nothing further needed.

## 2015-05-15 NOTE — Progress Notes (Signed)
Subjective:   Patient ID: Tonya [name], female    DOB: 23-Sep-1945   MRN: [id number]    Brief patient profile:  41 yowf never smoker with documented bronchiectasis and moderate airflow obst with chronic cough with all of her symptoms improved on Symbicort 80/4.5 two puffs b.i.d.    History of Present Illness  06/27/2014 Follow up and Med   re: bronchiectasis/ refractory  Patient returns for a follow-up and medication review  We reviewed all her medications organize them into a medication count with patient education. Cough and congestion are better, Mucinex and Flutter really help On O2 at 2l/m At bedtime  In pulmonary rehab , needs O2 with exercise.  Today in office sats drop 86% on RA  O2 at rest >90% .  O2 on 3lm with walking kept O2 sats >90%.  Denies chest pain, orthopnea, edema or hemoptysis or fever.  >>O2 rx      09/23/2014 f/u ov/Wert re: bronchiectasis/ 02 dep / symbicort 160 2bid / rare use of xopenex  Chief Complaint  Patient presents with  . Follow-up    pts breathing unchanged, she gets sob with exhertion even with 02 she still gets sob. Pt has slight sore throat.She feels very tense. Pt has cough with greenish to yellow to clear mucus. She just picked up abx but has not started zpac.  does not have a prn abx on med calendar, not using 3lpm with activity as the instructions say "exercise" which she doesn't do. Sleeps ok  rec Increase 02 to 3lpm with activity or whatever you need to maintain 02 sats over 90% if your are monitoring it with a pulse oximeter Zpak Prednisone 10 mg take  4 each am x 2 days,   2 each am x 2 days,  1 each am x 2 days and stop  Work on inhaler technique        12/30/2014 f/u ov/Wert re: bronchiectasis Chief Complaint  Patient presents with  . Follow-up    Pt c/o decreased energy levels. Pt states that she only has increased SOB with exertion.     Not so much breathing/ coughing issues but poor energy and only on zoloft 12.5 mg  daily   Struggling to help husband who has parkinson's/ does ok on her own on 3lpm  Not using xopenex  rec Increase Zoloft to 25 mg per day and see DR Tenny Craw in 3 weeks (no sooner) Please remember to go to the lab and x-ray department downstairs for your tests - we will call you with the results when they are available. Wear 02 when walking outside of your house at 3 lpm     03/22/2015 f/u ov/Wert re: ftt x Jan 2016  Chief Complaint  Patient presents with  . Acute Visit    Pt c/o worsening SOB with exertion-2-3 weeks. Pt also states productive cough with clear ro yellow mucus and chest tightness. Pt denies wheezing, chest pain or recent fever    zoloft ? Lights changed/ blurry viz @ 50 mg per day so stopped it  Onset of sob was insidious, becoming more difficult to use hfa / now sob with more than slow walk = mmrc 2 even on 3lpm  >pred taper   05/15/2015 Post Hospital follow up   pt returns for follow up from recent hospital admission for bronchiectatic exacerbation .  tx w/ IV abx. W/ ceftin and pred taper. CXR showed chronic changes.  She is starting to feel better but remains  weak.  She now has the VEST but has only used a few times PTA.  She could only use for about 5 min  At a time, we discussed building up on the use /frequency .  It was recommended from hospital records pt be referred to Allergist and family requests this.  Concern for possible ABPA . Previous IgE was <100. Rast Post for aspergillous .  Sputum cx attempted during admssion was not adquate. No previous sputum cx for AFB .  Family is helping her , she has to take care of her husband.  She denies hemotpysis , chest pai, orthopnea or edema.      Current Medications, Allergies, Complete Past Medical History, Past Surgical History, Family History, and Social History were reviewed in [employer] record.  ROS  The following are not active complaints unless bolded sore throat, dysphagia, dental  problems, itching, sneezing,  nasal congestion or excess/ purulent secretions, ear ache,   fever, chills, sweats, unintended wt loss, pleuritic or exertional cp, hemoptysis,  orthopnea pnd or leg swelling, presyncope, palpitations, heartburn, abdominal pain, anorexia, nausea, vomiting, diarrhea  or change in bowel or urinary habits, change in stools or urine, dysuria,hematuria,  rash, arthralgias, visual complaints, headache, numbness weakness or ataxia or problems with walking or coordination,   affect or memory.          Past Medical History:  Bronchiectasis  - CT chest and sinus September 08, 2009 >> mild diffuse bronchiectasis with mild sphenoid sinusitis  - HFA poor September 05, 2009 > 75 % with coaching January 30, 2010  Hypertension  GERD..................................................................................Marland KitchenEdwards  - Pos EGD 05/13/08  Complex med regimen  ---Meds reviewed with pt education and computerized med calendar completed/adjusted October 17, 2009 , 02/18/2011 , 11/16/2012 . 06/27/2014         Objective:   Physical Exam  Amb wf nad  / nad, frail and eldelry  wt 1 132 October 04, 2008 >    126 January 30, 2010 > 02/12/2012 124 > 05/27/2012  126 > 122 08/26/2012 > 10/15/2012  119>118 11/16/2012 > 118 03/18/2013 > 114 10/29/2013 >113 11/12/2013 > 116 12/24/2013 > 03/24/2014  118 > 04/13/2014 113 >113 06/27/2014 >111 07/21/2014 > 08/04/14  107>  09/23/2014 104 > 12/30/2014 103 > 03/22/2015 102 >101 05/15/2015  HEENT mild turbinate edema, non-tender sinus, clear discharge. Oropharynx no thrush or excess pnd or cobblestoning. No JVD or cervical adenopathy. Mild accessory muscle hypertrophy. Trachea midline, nl thryroid. Chest with bilateral mid exp rhonchi ,no wheezing. Regular rate and rhythm without murmur gallop or rub or increase P2. No edema Abd: no hsm, nl excursion. Ext warm without cyanosis or clubbing.       Labs   reviewed:    Lab 12/30/14 1122  NA 140  K 3.7  CL 99  CO2 36*  BUN 13   CREATININE 0.67  GLUCOSE 94       Lab 12/30/14 1122  HGB 15.5*  HCT 46.5*  WBC 10.0  PLT 211.0     Lab Results  Component Value Date   TSH 1.19 12/30/2014     Lab Results  Component Value Date   PROBNP 32.0 12/30/2014     Lab Results  Component Value Date   ESRSEDRATE 9 12/30/2014         Assessment & Plan:

## 2015-05-15 NOTE — Assessment & Plan Note (Signed)
Compensated on o2.  

## 2015-05-15 NOTE — Telephone Encounter (Signed)
Pt returning call, I let her know that dr's were ok with the switch.Caren Griffins

## 2015-05-15 NOTE — Telephone Encounter (Signed)
lmtcb for pt.  

## 2015-05-15 NOTE — Telephone Encounter (Signed)
OK by me 

## 2015-05-15 NOTE — Assessment & Plan Note (Signed)
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   Plan  Refer to Dr. Sharyn Lull for allergy consult .  Flu shot today  Continue with Flutter valve  Wear VEST as able.  Follow up in 6 weeks and As needed .  Please contact office for sooner follow up if symptoms do not improve or worsen or seek emergency care

## 2015-05-15 NOTE — Telephone Encounter (Signed)
Pt is requesting to change to Dr. Kendrick Fries. Please advise MW thanks

## 2015-05-15 NOTE — Telephone Encounter (Signed)
Fine with me

## 2015-05-15 NOTE — Progress Notes (Signed)
Chart and office note reviewed in detail along with available xrays/ labs :   Eos 3.1 % IgE  83 mild Pos aspergillus so unlikely this is abpa > agree with a/p as outlined including second opinion from Dr Lucie Leather

## 2015-05-15 NOTE — Patient Instructions (Addendum)
Refer to Dr. Sharyn Lull for allergy consult .  Flu shot today  Continue with Flutter valve  Wear VEST as able.  Follow up in 6 weeks and As needed .  Please contact office for sooner follow up if symptoms do not improve or worsen or seek emergency care

## 2015-05-15 NOTE — Progress Notes (Signed)
I agree with this plan of care 

## 2015-05-15 NOTE — Telephone Encounter (Signed)
Dr Kendrick Fries, please advise if you are ok with accepting this pt.

## 2015-05-16 NOTE — Telephone Encounter (Signed)
Tonya [name] returning call.Tonya [name]

## 2015-05-16 NOTE — Telephone Encounter (Signed)
Left message for Tonya [name] to call back 

## 2015-05-16 NOTE — Telephone Encounter (Signed)
Kennyth Arnold can be reached @ [phone].Caren Griffins

## 2015-05-16 NOTE — Telephone Encounter (Signed)
Spoke with Kennyth Arnold at [employer], she said that she spoke with patient and patient advised her that she received a vest from [employer].    Called patient and patient advised me that she received the vest from [employer].  She found out after she received the vest that insurance would not cover the vest.  She said that she called [employer] to have them pick up the vest because she could not afford to pay for it out of pocket and they told her that she could keep the vest at no charge that it was "her vest now".  She said that they told her they would not bill her for the vest, but she mentioned that they were going to "try again" with insurance.  They refused to pick up the vest.  I called Amy at [facility] to confirm what insurance will pay for:  She advised me that they would not pay for a vest from [employer].  Amy said that she has received PA request from our office but denied it because it can only come from [facility].  I advised Amy of the situation with [employer], that they gave the vest to patient, but she is concerned that they will bill patient for the vest.  Furthermore, she said that if anything were to happen to the vest, should it need repair, etc.. That insurance would not pay for it since it did not come from [facility].    Advised patient of above conversation with Amy and patient agreed that her best option would be to return the vest to [employer] and get vest from [facility].  Patient said that she would call [employer] again to have them come pick up the vest.    I called and advised Stacy to process order.  Patient has been advised that Kennyth Arnold will contact her to complete the order.  Nothing further needed.

## 2015-05-18 ENCOUNTER — Telehealth: Payer: Self-pay | Admitting: Pulmonary Disease

## 2015-05-18 MED ORDER — ALBUTEROL SULFATE (2.5 MG/3ML) 0.083% IN NEBU: 2.5000 mg | INHALATION_SOLUTION | Freq: Four times a day (QID) | RESPIRATORY_TRACT | Status: AC | PRN

## 2015-05-18 NOTE — Telephone Encounter (Signed)
Called made pt aware RX has been sent in. Nothing further needed

## 2015-05-18 NOTE — Telephone Encounter (Signed)
Pt was given a neb machine while in the hospital.  Dr. Randol KernElgergawy from the hospital sent her RX for Albuterol with 12 refills.  She was only given 75ml (25 vials) and pt takes 2 vials daily, order says she can have every 6 hours, but pt has been taking it 2 times daily, 1 time in the AM and 1 time in PM.  Pt says she is out of medication and needs a refill from her doctor sent in to pharmacy (with DX code on it)   Dr. Kendrick FriesMcQuaid, ok to send in Albuterol for Neb?

## 2015-05-18 NOTE — Telephone Encounter (Signed)
Okay to refill albuterol nebulizer

## 2015-06-06 ENCOUNTER — Ambulatory Visit (INDEPENDENT_AMBULATORY_CARE_PROVIDER_SITE_OTHER): Payer: Medicare Other | Admitting: Allergy and Immunology

## 2015-06-06 ENCOUNTER — Encounter: Payer: Self-pay | Admitting: Allergy and Immunology

## 2015-06-06 ENCOUNTER — Telehealth: Payer: Self-pay | Admitting: Internal Medicine

## 2015-06-06 ENCOUNTER — Other Ambulatory Visit: Payer: Self-pay | Admitting: Allergy and Immunology

## 2015-06-06 VITALS — BP 106/52 | HR 96 | Temp 97.5°F | Resp 26 | Ht 61.02 in | Wt 99.2 lb

## 2015-06-06 DIAGNOSIS — J310 Chronic rhinitis: Secondary | ICD-10-CM | POA: Diagnosis not present

## 2015-06-06 DIAGNOSIS — J449 Chronic obstructive pulmonary disease, unspecified: Secondary | ICD-10-CM

## 2015-06-06 DIAGNOSIS — K219 Gastro-esophageal reflux disease without esophagitis: Secondary | ICD-10-CM | POA: Diagnosis not present

## 2015-06-06 DIAGNOSIS — Z9981 Dependence on supplemental oxygen: Secondary | ICD-10-CM | POA: Diagnosis not present

## 2015-06-06 MED ORDER — ARFORMOTEROL TARTRATE 15 MCG/2ML IN NEBU: 15.0000 ug | INHALATION_SOLUTION | Freq: Two times a day (BID) | RESPIRATORY_TRACT | Status: DC

## 2015-06-06 MED ORDER — BUDESONIDE 0.5 MG/2ML IN SUSP: 0.5000 mg | Freq: Two times a day (BID) | RESPIRATORY_TRACT | Status: DC

## 2015-06-06 MED ORDER — IPRATROPIUM-ALBUTEROL 0.5-2.5 (3) MG/3ML IN SOLN: 3.0000 mL | RESPIRATORY_TRACT | Status: DC | PRN

## 2015-06-06 NOTE — Patient Instructions (Addendum)
  1. Blood - Aspergillus IgE panel, aspergillus precipitants, alpha -1- antitrypsinase level and phenotype, IgA,G,M, IgE, antipneumococcal antibody titers  2. Change Symbicort to :   A. Budesonide 0.5 nebulize two times per day  B. Brovana nebulize two times per day  3. Can use albuterol / ipratropium nebulization if needed.  4. Prevnar, Pneumovax, Flu vaccination  5. Continue therapy for reflux and nasal fluticasone spray.  6. Return in 3 weeks.

## 2015-06-06 NOTE — Progress Notes (Addendum)
[facility]    NEW PATIENT NOTE  Referring Provider: Gildardo Cranker, MD Primary Provider:  Duane Lope, MD    Subjective:   Patient ID: Tonya [name] is a 69 y.o. female with a chief complaint of Other  and the following problems:  HPI Comments:  Tonya [name] presents this clinic in evaluation of bronchiectasis. Tonya [name]'s main problem is the fact that she is out of breath if she exerts herself to any degree. She can no longer vacuum and clean her house. She can no longer walk from the house to the car without getting breathless. This occurs even though she continues to use 2-3 L oxygen nasal cannula continuously. She can sleep at nighttime without much difficulty. Sometimes her phlegm is clear and sometimes it is green. She did undergo a course of pulmonary rehabilitation in October through December 2015 but unfortunately she developed an exacerbation in December 2015 and has never fully recovered to baseline since that event. Tonya [name] tells me that she uses prednisone albuterol a possibly 3 times per year which does help her. She's currently on Symbicort and nasal fluticasone. She doesn't have a tremendous amount of upper airway symptoms. She can smell fine and does not have any ugly nasal discharge. She does have reflux that she believes is under very good control using pantoprazole and famotidine.  [name]'s has had extensive evaluation for her lung condition in the past. She seen multiple physicians and has had multiple imaging procedures and multiple blood tests. In review of her blood tests I could not find a documented alpha-1 antitrypsin level or phenotype. She did have a documented IgE of less than 100 and had low titers of antibodies directed against Aspergillus. She did not demonstrate any significant titer of IgE antibodies directed against various aeroallergens. A chest CT scan obtained in December 2015 identifies significant  bronchiectasis, some ground glass attenuation, and extensive air trapping. She had a sinus CT scan in 2011 which identified no significant sinusitis.  She has had the Pneumovax, Prevnar, and flu vaccine for year 2016.   Past Medical History  Diagnosis Date  . Bronchiectasis   . Hypertension   . GERD (gastroesophageal reflux disease)     History reviewed. No pertinent past surgical history.  Outpatient Encounter Prescriptions as of 06/06/2015  Medication Sig  . albuterol (PROVENTIL) (2.5 MG/3ML) 0.083% nebulizer solution Take 3 mLs (2.5 mg total) by nebulization every 6 (six) hours as needed for wheezing or shortness of breath. DX j47.1, j96.12  . aspirin EC 81 MG tablet Take 81 mg by mouth at bedtime.  Marland Kitchen b complex vitamins tablet Take 1 tablet by mouth daily.   . budesonide-formoterol (SYMBICORT) 160-4.5 MCG/ACT inhaler Take 2 puffs first thing in am and then another 2 puffs about 12 hours later. (Patient taking differently: Inhale 2 puffs into the lungs every 12 (twelve) hours. )  . Calcium Carb-Cholecalciferol (CALCIUM PLUS VITAMIN D3 PO) Take 1 tablet by mouth daily.   Marland Kitchen Dextromethorphan-Guaifenesin (MUCINEX DM MAXIMUM STRENGTH) 60-1200 MG TB12 Take 1 tablet by mouth 2 (two) times daily as needed (congestion/cough).  . famotidine (PEPCID) 20 MG tablet Take 20 mg by mouth at bedtime.  . fluticasone (FLONASE) 50 MCG/ACT nasal spray USE 1 TO 2 SPRAYS IN EACH NOSTRIL IN THE MORNING AND AT BEDTIME (Patient taking differently: USE 1 TO 2 SPRAYS IN EACH NOSTRIL IN THE MORNING  AS NEEDED FOR CONGESTION  AND EVERY NIGHT AT BEDTIME)  .  losartan-hydrochlorothiazide (HYZAAR) 100-12.5 MG per tablet Take 1 tablet by mouth daily.   . Multiple Vitamin (MULTIVITAMIN WITH MINERALS) TABS tablet Take 1 tablet by mouth at bedtime.  . OXYGEN Inhale 2 L into the lungs at bedtime.  . OXYGEN Use 3L of O2 with activity as needed  . pantoprazole (PROTONIX) 40 MG tablet Take 30- 60 min before your first and last  meals of the day (Patient taking differently: Take 40 mg by mouth daily before breakfast. Take 30- 60 min before breakfast every morning)  . Respiratory Therapy Supplies (FLUTTER) DEVI Use several times a day as needed for thick mucus, congestion, cough (Patient taking differently: Use every hour as needed for thick mucus, congestion, and cough)  . benzonatate (TESSALON) 200 MG capsule TAKE 1 CAPSULE (200 MG TOTAL) BY MOUTH 3 (THREE) TIMES DAILY AS NEEDED FOR COUGH. (Patient not taking: Reported on 06/06/2015)  . cefUROXime (CEFTIN) 250 MG tablet Take 1 tablet (250 mg total) by mouth 2 (two) times daily with a meal. (Patient not taking: Reported on 05/15/2015)  . levalbuterol (XOPENEX HFA) 45 MCG/ACT inhaler Inhale 1-2 puffs into the lungs every 4 (four) hours as needed for wheezing. (Patient not taking: Reported on 06/06/2015)  . oxymetazoline (AFRIN) 0.05 % nasal spray 2 puffs twice daily for five days and as needed for nasal congestion (Patient not taking: Reported on 05/15/2015)  . predniSONE (DELTASONE) 10 MG tablet Take  po daily for 2 days, then take  po daily for 3 days, then take  po daily for two days, then take  po daily for 2 daysthen stop. (Patient not taking: Reported on 05/15/2015)  . saccharomyces boulardii (FLORASTOR) 250 MG capsule Take 1 capsule (250 mg total) by mouth 2 (two) times daily. (Patient not taking: Reported on 05/15/2015)   No facility-administered encounter medications on file as of 06/06/2015.    No orders of the defined types were placed in this encounter.    Allergies  Allergen Reactions  . Alprazolam Other (See Comments)    REACTION: "I just feel funny"  . Metoclopramide Hcl Other (See Comments)    Either swelling or funny feeling  . Tramadol     REACTION: made pt "feel funny"  . Doxycycline Swelling and Rash     rash, redness in face, slight swelling in lips  . Levaquin [Levofloxacin] Swelling and Rash     redness in face, rash, slight  swelling in lips  . Moxifloxacin Swelling and Rash    Slight swelling in lips  . Penicillins Rash    Has patient had a PCN reaction causing immediate rash, facial/tongue/throat swelling, SOB or lightheadedness with hypotension: Yes Has patient had a PCN reaction causing severe rash involving mucus membranes or skin necrosis: No Has patient had a PCN reaction that required hospitalization No Has patient had a PCN reaction occurring within the last 10 years: No If all of the above answers are "NO", then may proceed with Cephalosporin use.  . Sulfonamide Derivatives Swelling and Rash    Slight swelling in lips    Review of Systems  Constitutional: Negative for fever, chills and fatigue.  HENT: Negative for congestion, ear pain, facial swelling, mouth sores, nosebleeds, postnasal drip, rhinorrhea, sinus pressure, sneezing, sore throat, tinnitus, trouble swallowing and voice change.   Eyes: Negative for pain, discharge, redness and itching.  Respiratory: Positive for shortness of breath. Negative for cough, choking, chest tightness, wheezing and stridor.   Cardiovascular: Negative for chest pain and leg swelling.  Gastrointestinal:  Positive for nausea. Negative for vomiting and abdominal pain.  Endocrine: Negative for cold intolerance and heat intolerance.  Musculoskeletal: Negative for myalgias and arthralgias.  Allergic/Immunologic: Negative.   Neurological: Negative for dizziness.  Hematological: Negative for adenopathy.    Family History  Problem Relation Age of Onset  . Emphysema Mother     smoker  . Lung cancer Father     Social History   Social History  . Marital Status: Married    Spouse Name: N/A  . Number of Children: 2  . Years of Education: N/A   Occupational History  . GafferAccounting manager    Social History Main Topics  . Smoking status: Never Smoker   . Smokeless tobacco: Never Used  . Alcohol Use: No  . Drug Use: Not on file  . Sexual Activity: Not on file    Other Topics Concern  . Not on file   Social History Narrative    Environmental and Social history  Lives in a house with a dry environment, no animals located inside the household, carpeting in the bedroom, sleeping,.plastic on the bed, no smoking ongoing since the household.   Objective:   Filed Vitals:   06/06/15 1349  BP: 106/52  Pulse: 96  Temp: 97.5 F (36.4 C)  Resp: 26   Height: 5' 1.02" (155 cm) Weight: 99 lb 3.3 oz (45 kg)  Physical Exam  Constitutional: She appears well-developed and well-nourished. No distress.  Tachypnea and pursed lip breathing and difficulty completing full sentences and nasal cannula  HENT:  Head: Normocephalic and atraumatic. Head is without right periorbital erythema and without left periorbital erythema.  Right Ear: Tympanic membrane, external ear and ear canal normal. No drainage or tenderness. No foreign bodies. Tympanic membrane is not injected, not scarred, not perforated, not erythematous, not retracted and not bulging. No middle ear effusion.  Left Ear: Tympanic membrane, external ear and ear canal normal. No drainage or tenderness. No foreign bodies. Tympanic membrane is not injected, not scarred, not perforated, not erythematous, not retracted and not bulging.  No middle ear effusion.  Nose: Nose normal. No mucosal edema, rhinorrhea, nose lacerations or sinus tenderness.  No foreign bodies.  Mouth/Throat: Oropharynx is clear and moist. No oropharyngeal exudate, posterior oropharyngeal edema, posterior oropharyngeal erythema or tonsillar abscesses.  Eyes: Lids are normal. Right eye exhibits no chemosis, no discharge and no exudate. No foreign body present in the right eye. Left eye exhibits no chemosis, no discharge and no exudate. No foreign body present in the left eye. Right conjunctiva is not injected. Left conjunctiva is not injected.  Neck: Neck supple. No tracheal tenderness present. No tracheal deviation and no edema present. No  thyroid mass and no thyromegaly present.  Cardiovascular: Normal rate, regular rhythm, S1 normal and S2 normal.  Exam reveals no gallop.   No murmur heard. Pulmonary/Chest: No accessory muscle usage or stridor. No respiratory distress. She has no wheezes. She has no rhonchi. She has no rales.  Abdominal: Soft. She exhibits no mass. There is no hepatosplenomegaly. There is no tenderness. There is no rigidity, no rebound and no guarding.  Musculoskeletal: She exhibits no edema.  Lymphadenopathy:       Head (right side): No tonsillar adenopathy present.       Head (left side): No tonsillar adenopathy present.    She has no cervical adenopathy.  Neurological: She is alert.  Skin: No rash noted. She is not diaphoretic. No erythema.  Psychiatric: She has a normal mood and  affect. Her behavior is normal.    Diagnostics:   Spirometry was performed and demonstrated an FEV1 of 0.39 @ 33 % of predicted.  Oxygen saturation on 2 L nasal cannula was 88% at rest. After a ipratropium and albuterol nebulization and an increase in her oxygen concentration to 3 L her oxygen saturation increased to 91%  The patient had an Asthma Control Test with the following results:  .  Not completed   Assessment and Plan:    1. Chronic obstructive pulmonary disease, unspecified COPD, unspecified chronic bronchitis type   2. Oxygen dependent   3. Chronic rhinitis   4. Gastroesophageal reflux disease, esophagitis presence not specified      1. Blood - Aspergillus IgE panel, aspergillus precipitants, alpha -1- antitrypsinase level and phenotype, IgA,G,M, IgE, antipneumococcal antibody titers  2. Change Symbicort to :   A. Budesonide 0.5 nebulize two times per day  B. Brovana nebulize two times per day  3. Can use albuterol / ipratropium nebulization if needed.  4. Continue therapy for reflux and nasal fluticasone spray.  5. Return in 3 weeks.  To be complete we will have Dois Davenport undergo evaluation as  specified above including evaluation for allergic bronchopulmonary aspergillosis and alpha-1 antitrypsin is deficiency and a B-cell immunodeficiency. The etiology of her lung condition is not entirely clear but certainly this condition has making major impact on her life. Based on her evaluation today it does not appear as though she is able to utilize Symbicort and the correct manner and will give her a nebulized form of budesonide and formoterol with the hope of getting better distribution within the lungs and will also add a anticholinergic agent to be used with her albuterol as needed. I will contact her with the results of her blood tests once they are available for review. Further evaluation and treatment will based upon her response to medical therapy and the results of her diagnostic testing.    Laurette Schimke, MD [facility]

## 2015-06-06 NOTE — Telephone Encounter (Signed)
Left message for [facility].

## 2015-06-07 NOTE — Telephone Encounter (Signed)
Tonya [name] states all she needs a script sent to [facility] for physical therapy.  Tonya [name] [phone]- [phone]

## 2015-06-07 NOTE — Telephone Encounter (Signed)
lmtcb

## 2015-06-07 NOTE — Telephone Encounter (Signed)
lmtcb for Bear StearnsKelli

## 2015-06-08 ENCOUNTER — Telehealth: Payer: Self-pay | Admitting: Pulmonary Disease

## 2015-06-08 NOTE — Telephone Encounter (Signed)
OK by me 

## 2015-06-08 NOTE — Telephone Encounter (Signed)
Patient wants to make sure that Dr. Kendrick FriesMcQuaid knows this is now her medication regimen and that he approves of the change.

## 2015-06-08 NOTE — Telephone Encounter (Signed)
lmomtcb for Bed Bath & BeyondKelly, [facility]

## 2015-06-08 NOTE — Telephone Encounter (Signed)
Called spoke with pt and made aware. Nothing further needed 

## 2015-06-08 NOTE — Telephone Encounter (Signed)
Called spoke with pt. She saw Dr. Lucie LeatherKozlow on Tuesday. She had breathing test done and after this he came in and told her he was changing her symbicort to Budesonide 0.5 BID and brovana BID. Pt to use albuterol PRN.  Pt scheduled to see Dr. Kendrick FriesMcQuaid on 06/27/15 and wants to make sure he is okay with this change. Pt was previous [facility] pt but changed over.  Please advise Dr. Kendrick FriesMcQuaid thanks

## 2015-06-15 LAB — PNEUMOCOCCAL IM (14 SEROTYPE)
PNEUMO AB TYPE 14: 17.5 ug/mL (ref >1.3)
PNEUMO AB TYPE 19 (19F): 42.8 ug/mL (ref >1.3)
PNEUMO AB TYPE 1: 3 ug/mL (ref >1.3)
PNEUMO AB TYPE 23 (23F): 7.6 ug/mL (ref >1.3)
PNEUMO AB TYPE 26 (6B): 4.1 ug/mL (ref >1.3)
PNEUMO AB TYPE 51 (7F): 10.9 ug/mL (ref >1.3)
PNEUMO AB TYPE 9 (9N): 11.6 ug/mL (ref >1.3)
Pneumo Ab Type 12 (12F)*: 2 ug/mL (ref >1.3)
Pneumo Ab Type 3*: 11.2 ug/mL (ref >1.3)
Pneumo Ab Type 4*: 9 ug/mL (ref >1.3)
Pneumo Ab Type 56 (18C)*: >17.7 ug/mL (ref >1.3)
Pneumo Ab Type 57 (19A)*: 24 ug/mL (ref >1.3)
Pneumo Ab Type 8*: 7.5 ug/mL (ref >1.3)

## 2015-06-15 LAB — IGG, IGA, IGM
IGM (IMMUNOGLOBULIN M), SRM: 59 mg/dL (ref 26–217)
IgA/Immunoglobulin A, Serum: 307 mg/dL (ref 87–352)
IgG (Immunoglobin G), Serum: 819 mg/dL (ref 700–1600)

## 2015-06-15 LAB — ASPERGILLUS ANTIBODY BY IMMUNODIFF
Aspergillus flavus: NEGATIVE
Aspergillus fumigatus, IgG: NEGATIVE
Aspergillus niger: NEGATIVE

## 2015-06-15 LAB — A.FUMIGATUS #1 ABS: A.Fumigatus #1 Abs: NEGATIVE

## 2015-06-15 LAB — ASPERGILLUS NIGER ANTIBODIES: ASPERGILLUS NIGER ANTIBODIES: NEGATIVE

## 2015-06-15 LAB — ASPERGILLUS FLAVUS ANTIBODIES: ASPERGILLUS FLAVUS ANTIBODIES: NEGATIVE

## 2015-06-15 LAB — ALPHA-1 ANTITRYPSIN PHENOTYPE: A1 ANTITRYPSIN: 193 mg/dL (ref 90–200)

## 2015-06-15 LAB — M003-IGE ASPERGILLUS FUMIGATUS: M003-IGE ASPERGILLUS FUMIGATUS: 0.42 kU/L — AB

## 2015-06-20 ENCOUNTER — Telehealth: Payer: Self-pay

## 2015-06-20 NOTE — Telephone Encounter (Signed)
Spoke to patient and advised per Dr. Lucie LeatherKozlow, results are OK and will discuss at Return Visit.

## 2015-06-27 ENCOUNTER — Telehealth: Payer: Self-pay | Admitting: Emergency Medicine

## 2015-06-27 ENCOUNTER — Encounter: Payer: Self-pay | Admitting: Pulmonary Disease

## 2015-06-27 ENCOUNTER — Ambulatory Visit (INDEPENDENT_AMBULATORY_CARE_PROVIDER_SITE_OTHER): Payer: Medicare Other | Admitting: Pulmonary Disease

## 2015-06-27 ENCOUNTER — Ambulatory Visit (INDEPENDENT_AMBULATORY_CARE_PROVIDER_SITE_OTHER)
Admission: RE | Admit: 2015-06-27 | Discharge: 2015-06-27 | Disposition: A | Discharge status: Home / Self Care | Payer: Medicare Other | Source: Ambulatory Visit | Attending: Pulmonary Disease | Admitting: Pulmonary Disease

## 2015-06-27 VITALS — BP 128/72 | HR 85 | Ht 61.0 in | Wt 100.0 lb

## 2015-06-27 DIAGNOSIS — J479 Bronchiectasis, uncomplicated: Secondary | ICD-10-CM | POA: Insufficient documentation

## 2015-06-27 DIAGNOSIS — J471 Bronchiectasis with (acute) exacerbation: Secondary | ICD-10-CM

## 2015-06-27 DIAGNOSIS — R0600 Dyspnea, unspecified: Secondary | ICD-10-CM

## 2015-06-27 MED ORDER — BUDESONIDE-FORMOTEROL FUMARATE 160-4.5 MCG/ACT IN AERO: INHALATION_SPRAY | RESPIRATORY_TRACT | Status: AC

## 2015-06-27 MED ORDER — PREDNISONE 10 MG PO TABS: ORAL_TABLET | ORAL | Status: DC

## 2015-06-27 MED ORDER — CEFUROXIME AXETIL 250 MG PO TABS: 250.0000 mg | ORAL_TABLET | Freq: Two times a day (BID) | ORAL | Status: DC

## 2015-06-27 NOTE — Assessment & Plan Note (Addendum)
Tonya [name] has been experiencing worsening dyspnea, cough with mucus production, and wheezing over the last several weeks. Today in the exam room she required 4 L of oxygen to maintain her O2 saturation greater than 90%. She is mildly dyspneic on exam today and is not moving much air. I explained to her that she's having another flare of her bronchiectasis.  This year her bronchiectasis has been worsening. She is confused about her medication regimen and it sounds like she has not been taking it appropriately at home. Further, she has not benefited from the nebulized medicines that she says that it doesn't make her feel better and its too difficult for her to take. She would like to go back on the Symbicort.  She's not been compliant with her therapy vest.  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 Clearly, she does not have a diagnosis by textbook criteria but this can be quite difficult to diagnose. Ideally like to have a skin test. However, I think it's time to move forward with prolonged steroid treatment because of the severity of her symptoms.  Today I offered a hospital visit versus trying to treat her symptoms at home. After lengthy discussion we decided to treat her at home, but she knows to go to the hospital this week if she does not have improvement.  Her plan is summarized as follows:  Stop brovana and pulmicort Start taking Symbicort 2 puffs twice a day Take albuterol nebulizer three times a day Use the Therapy vest twice a day Take prednisone 40mg  daily for 2 weeks, then 30mg  daily for two weeks, then 20mg  daily for a month, then 10mg  daily for a month Take ceftin by mouth twice a day for 7 days with a probiotic Take mucinex extended release twice a day  Go to the hospital if you get worse Use 4L O2 continuously We  will see you back in 2 weeks or sooner if needed

## 2015-06-27 NOTE — Telephone Encounter (Signed)
Clarified Dips# amount on patient's prednisone script to cover instructions in Dr Ulyses JarredMcQuaid's note from OV today:   "Take prednisone 40mg  daily for 2 weeks, then 30mg  daily for two weeks, then 20mg  daily for a month, then 10mg  daily for a month"  Levy Pupaobert Natale Barba, MD, PhD 06/27/2015, 6:02 PM [facility] [phone] or if no answer [phone]

## 2015-06-27 NOTE — Patient Instructions (Signed)
Stop brovana and pulmicort Start taking Symbicort 2 puffs twice a day Take albuterol nebulizer three times a day Use the Therapy vest twice a day Take prednisone 40mg  daily for 2 weeks, then 30mg  daily for two weeks, then 20mg  daily for a month, then 10mg  daily for a month Take ceftin by mouth twice a day for 7 days with a probiotic Take mucinex extended release twice a day  Go to the hospital if you get worse Use 4L O2 continuously We will see you back in 2 weeks or sooner if needed

## 2015-06-27 NOTE — Progress Notes (Signed)
Subjective:    Patient ID: Tonya [name], female    DOB: Oct 05, 1945, 69 y.o.   MRN: [id number]  Synopsis: Anthonette LegatoSarah Engelbert has a past medical history significant for idiopathic bronchiectasis which worsened in 2016 requiring a hospitalization for an acute flare and worsening of chronic hypoxemic respiratory failure. This is in the setting of improvement with prednisone treatment, and significant wheezing. She had an elevated serum eosinophil. She was sent to an allergist (Dr. Lucie LeatherKozlow), the bulk of her allergy workup, her serum immunoglobulin profiles, and her alpha-1 antitrypsin levels were fine. She did have a very mildly elevated Aspergillus IgE antibody.  HPI Chief Complaint  Patient presents with  . Follow-up    MW pt c/o increased fatigue, DOE, prod cough with white-yellow mucus darkening this week.     Maralyn SagoSarah says that she's not been doing well. She's been experiencing increasing shortness of breath, cough, change in mucus color to a darker color, and worsening shortness of breath. She denies fevers or chills or chest pain. She denies nausea vomiting or diarrhea. She says she's been generally weak. She's confused about what to take in terms of her medications. She is not using the therapy vest because she doesn't like it. She says that the nebulizer treatments take too long and she feels "worn out" after taking it and she doesn't feeling better than when she was taking the Symbicort. She doesn't know how often to take the albuterol. She's been using the oxygen at home but sometimes dries out her sinuses. She comes to clinic today accompanied by her daughter.  Past Medical History  Diagnosis Date  . Bronchiectasis   . Hypertension   . GERD (gastroesophageal reflux disease)     Review of Systems  Constitutional: Positive for fatigue. Negative for fever and chills.  HENT: Positive for sinus pressure. Negative for postnasal drip.   Respiratory: Positive for cough, shortness of breath  and wheezing.   Cardiovascular: Negative for chest pain, palpitations and leg swelling.       Objective:   Physical Exam Filed Vitals:   06/27/15 1642  BP: 128/72  Pulse: 85  Height: 5\' 1"  (1.549 m)  Weight: 100 lb (45.36 kg)  SpO2: 92%   4L [place]  Gen: chronically ill appearing, speaking in full sentences HENT: OP clear, TM's clear, neck supple PULM: Wheezing, few basilar crackles, mild tachypnea but no accessory muscle use CV: RRR, no mgr, trace edema GI: BS+, soft, nontender Derm: no cyanosis or rash Psyche: normal mood and affect  Dr. Kathyrn LassKozlow's records were reviewed extensively where her bronchodilators were change to proton and Pulmicort. She had allergy testing and a serum IgE to Aspergillus was mildly positive. Her most recent chest x-ray images personally reviewed showing hyperinflation but no infiltrate      Assessment & Plan:  Bronchiectasis without acute exacerbation (HCC) Dois DavenportSandra has been experiencing worsening dyspnea, cough with mucus production, and wheezing over the last several weeks. Today in the exam room she required 4 L of oxygen to maintain her O2 saturation greater than 90%. She is mildly dyspneic on exam today and is not moving much air. I explained to her that she's having another flare of her bronchiectasis.  This year her bronchiectasis has been worsening. She is confused about her medication regimen and it sounds like she has not been taking it appropriately at home. Further, she has not benefited from the nebulized medicines that she says that it doesn't make her feel better and its too difficult  for her to take. She would like to go back on the Symbicort.  She's not been compliant with her therapy vest.  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 Clearly, she does not have a diagnosis by  textbook criteria but this can be quite difficult to diagnose. Ideally like to have a skin test. However, I think it's time to move forward with prolonged steroid treatment because of the severity of her symptoms.  Today I offered a hospital visit versus trying to treat her symptoms at home. After lengthy discussion we decided to treat her at home, but she knows to go to the hospital this week if she does not have improvement.  Her plan is summarized as follows:  Stop brovana and pulmicort Start taking Symbicort 2 puffs twice a day Take albuterol nebulizer three times a day Use the Therapy vest twice a day Take prednisone  daily for 2 weeks, then  daily for two weeks, then  daily for a month, then  daily for a month Take ceftin by mouth twice a day for 7 days with a probiotic Take mucinex extended release twice a day  Go to the hospital if you get worse Use 4L O2 continuously We will see you back in 2 weeks or sooner if needed  Greater than 50% of time was spent face-to-face during this 25 minute visit  Current outpatient prescriptions:  .  albuterol (PROVENTIL) (2.5 MG/3ML) 0.083% nebulizer solution, Take 3 mLs (2.5 mg total) by nebulization every 6 (six) hours as needed for wheezing or shortness of breath. DX j47.1, j96.12, Disp: 360 mL, Rfl: 6 .  aspirin EC 81 MG tablet, Take 81 mg by mouth at bedtime., Disp: , Rfl:  .  b complex vitamins tablet, Take 1 tablet by mouth daily. , Disp: , Rfl:  .  Calcium Carb-Cholecalciferol (CALCIUM PLUS VITAMIN D3 PO), Take 1 tablet by mouth daily. , Disp: , Rfl:  .  Dextromethorphan-Guaifenesin (MUCINEX DM MAXIMUM STRENGTH) 60-1200 MG TB12, Take 1 tablet by mouth 2 (two) times daily as needed (congestion/cough)., Disp: , Rfl:  .  famotidine (PEPCID) 20 MG tablet, Take 20 mg by mouth at bedtime., Disp: , Rfl:  .  fluticasone (FLONASE) 50 MCG/ACT nasal spray, USE 1 TO 2 SPRAYS IN EACH NOSTRIL IN THE MORNING AND AT BEDTIME (Patient taking  differently: USE 1 TO 2 SPRAYS IN EACH NOSTRIL IN THE MORNING  AS NEEDED FOR CONGESTION  AND EVERY NIGHT AT BEDTIME), Disp: 16 g, Rfl: 4 .  levalbuterol (XOPENEX HFA) 45 MCG/ACT inhaler, Inhale 1-2 puffs into the lungs every 4 (four) hours as needed for wheezing., Disp: 1 Inhaler, Rfl: 12 .  losartan-hydrochlorothiazide (HYZAAR) 100-12.5 MG per tablet, Take 1 tablet by mouth daily. , Disp: , Rfl:  .  Multiple Vitamin (MULTIVITAMIN WITH MINERALS) TABS tablet, Take 1 tablet by mouth at bedtime., Disp: , Rfl:  .  OXYGEN, Inhale 2 L into the lungs at bedtime., Disp: , Rfl:  .  OXYGEN, Use 3L of O2 with activity as needed, Disp: , Rfl:  .  oxymetazoline (AFRIN) 0.05 % nasal spray, 2 puffs twice daily for five days and as needed for nasal congestion, Disp: , Rfl:  .  pantoprazole (PROTONIX) 40 MG tablet, Take 30- 60 min before your first and last meals of the day (Patient taking differently: Take 40 mg  by mouth daily before breakfast. Take 30- 60 min before breakfast every morning), Disp: 30 tablet, Rfl: 2 .  predniSONE (DELTASONE) 10 MG tablet,  X 14 days,30mg  X14 days,  X 30 days,   X 30 days, Disp: 23 tablet, Rfl: 0 .  Respiratory Therapy Supplies (FLUTTER) DEVI, Use several times a day as needed for thick mucus, congestion, cough (Patient taking differently: Use every hour as needed for thick mucus, congestion, and cough), Disp: , Rfl:  .  saccharomyces boulardii (FLORASTOR) 250 MG capsule, Take 1 capsule (250 mg total) by mouth 2 (two) times daily., Disp: 20 capsule, Rfl: 0 .  budesonide-formoterol (SYMBICORT) 160-4.5 MCG/ACT inhaler, Take 2 puffs first thing in am and then another 2 puffs about 12 hours later., Disp: 1 Inhaler, Rfl: 11 .  cefUROXime (CEFTIN) 250 MG tablet, Take 1 tablet (250 mg total) by mouth 2 (two) times daily with a meal., Disp: 14 tablet, Rfl: 0

## 2015-07-03 ENCOUNTER — Telehealth: Payer: Self-pay | Admitting: Pulmonary Disease

## 2015-07-03 NOTE — Progress Notes (Signed)
Quick Note:  Contacted pt with results per BQ Pt expressed understanding, nothing further needed ______ 

## 2015-07-03 NOTE — Telephone Encounter (Signed)
Per 06/27/15 OV: Patient Instructions     Stop brovana and pulmicort Start taking Symbicort 2 puffs twice a day Take albuterol nebulizer three times a day Use the Therapy vest twice a day Take prednisone 40mg  daily for 2 weeks, then 30mg  daily for two weeks, then 20mg  daily for a month, then 10mg  daily for a month Take ceftin by mouth twice a day for 7 days with a probiotic Take mucinex extended release twice a day  Go to the hospital if you get worse Use 4L O2 continuously We will see you back in 2 weeks or sooner if needed  --  Called spoke with pt. She wanted to verify she is to use her albuterol neb TID (i confirmed this is correct).  Also wanted to know how long she should take mucinex ER BID? She is feeling better. She is using her O2 4 liters cont. She wants to know how long she will need to stay at 4l/m or will she be re-evaluated at her OV on 12/12? Pt aware Dr. Kendrick FriesMcQuaid thanks

## 2015-07-04 ENCOUNTER — Ambulatory Visit: Payer: Medicare Other | Admitting: Internal Medicine

## 2015-07-04 NOTE — Telephone Encounter (Signed)
Stay on 4Lpm until next visit mucinex until next visit

## 2015-07-04 NOTE — Telephone Encounter (Signed)
Pt is aware of below. She verbalized understanding and had no further question.

## 2015-07-18 ENCOUNTER — Telehealth: Payer: Self-pay | Admitting: Pulmonary Disease

## 2015-07-18 ENCOUNTER — Ambulatory Visit (INDEPENDENT_AMBULATORY_CARE_PROVIDER_SITE_OTHER): Payer: Medicare Other | Admitting: Adult Health

## 2015-07-18 ENCOUNTER — Encounter: Payer: Self-pay | Admitting: Adult Health

## 2015-07-18 VITALS — BP 120/70 | HR 82 | Temp 98.5°F | Ht 61.0 in | Wt 99.0 lb

## 2015-07-18 DIAGNOSIS — J471 Bronchiectasis with (acute) exacerbation: Secondary | ICD-10-CM

## 2015-07-18 DIAGNOSIS — R0600 Dyspnea, unspecified: Secondary | ICD-10-CM

## 2015-07-18 DIAGNOSIS — J9612 Chronic respiratory failure with hypercapnia: Secondary | ICD-10-CM | POA: Diagnosis not present

## 2015-07-18 MED ORDER — BUDESONIDE-FORMOTEROL FUMARATE 160-4.5 MCG/ACT IN AERO: 2.0000 | INHALATION_SPRAY | Freq: Two times a day (BID) | RESPIRATORY_TRACT | Status: AC

## 2015-07-18 MED ORDER — LEVALBUTEROL HCL 0.63 MG/3ML IN NEBU
0.6300 mg | INHALATION_SOLUTION | Freq: Once | RESPIRATORY_TRACT | Status: AC
  Administered 2015-07-18: 0.63 mg via RESPIRATORY_TRACT

## 2015-07-18 MED ORDER — METHYLPREDNISOLONE ACETATE 80 MG/ML IJ SUSP
80.0000 mg | Freq: Once | INTRAMUSCULAR | Status: AC
Start: 2015-07-18 — End: 2015-07-18
  Administered 2015-07-18: 80 mg via INTRAMUSCULAR

## 2015-07-18 MED ORDER — CEFUROXIME AXETIL 500 MG PO TABS: 500.0000 mg | ORAL_TABLET | Freq: Two times a day (BID) | ORAL | Status: DC

## 2015-07-18 NOTE — Progress Notes (Signed)
Subjective:    Patient ID: Tonya [name], female    DOB: 1945-09-11, 69 y.o.   MRN: [id number]  HPI 69 yo female never smoker with bronchiectasis and chronic hypoxic resp failure on O2.   Synopsis: Tonya [name] has a past medical history significant for idiopathic bronchiectasis which worsened in 2016 requiring a hospitalization for an acute flare and worsening of chronic hypoxemic respiratory failure. This is in the setting of improvement with prednisone treatment, and significant wheezing. She had an elevated serum eosinophil. She was sent to an allergist (Dr. Lucie LeatherKozlow), the bulk of her allergy workup, her serum immunoglobulin profiles, and her alpha-1 antitrypsin levels were fine. She did have a very mildly elevated Aspergillus IgE antibody.    07/18/2015 Acute OV  Pt complains of increased cough and congestion with green mucus for last 4 days . More sob with activity . Feels more tight and wheezy.  She was seen 3 weeks ago, tx/ w/ ceftin for 7 days and started on chronic steroids with slow pred taper. She is currently on prednisone 30mg  daily .  She says she felt better for little while but last few days worse.  Neb machine does not seem to be working , takes long time to do a neb and does not feel any better after albuterol neb at home.  Given xopenex neb in office today with significant improvement in symptoms.  Family is with patient today . She continues to remain weak. Wt is down 1 kb at 99lbs. Says she is eating and drinking . Wt is down ~10lbs over last 1 year.  O2 was increased 4l/m last ov. Sats today are 93% on 4l/m today .  She denies chest pain, orthopnea, edema or hemoptysis . No fever or n/v.d.  CXR last ov with chronic changes.     Review of Systems Constitutional:   No  weight loss, night sweats,  Fevers, chills, + fatigue, or  lassitude.  HEENT:   No headaches,  Difficulty swallowing,  Tooth/dental problems, or  Sore throat,                No sneezing, itching,  ear ache, nasal congestion, post nasal drip,   CV:  No chest pain,  Orthopnea, PND, swelling in lower extremities, anasarca, dizziness, palpitations, syncope.   GI  No heartburn, indigestion, abdominal pain, nausea, vomiting, diarrhea, change in bowel habits, loss of appetite, bloody stools.   Resp: .  No chest wall deformity  Skin: no rash or lesions.  GU: no dysuria, change in color of urine, no urgency or frequency.  No flank pain, no hematuria   MS:  No joint pain or swelling.  No decreased range of motion.  No back pain.  Psych:  No change in mood or affect. No depression or anxiety.  No memory loss.         Objective:   Physical Exam GEN: A/Ox3; pleasant , NAD, frail and elderly   HEENT:  [place]/AT,  EACs-clear, TMs-wnl, NOSE-clear, THROAT-clear, no lesions, no postnasal drip or exudate noted.   NECK:  Supple w/ fair ROM; no JVD; normal carotid impulses w/o bruits; no thyromegaly or nodules palpated; no lymphadenopathy.  RESP  Decreased BS in bases , no dullness to percussion  CARD:  RRR, no m/r/g  , no peripheral edema, pulses intact, no cyanosis or clubbing.  GI:   Soft & nt; nml bowel sounds; no organomegaly or masses detected.  Musco: Warm bil, no deformities or joint swelling noted.  Neuro: alert, no focal deficits noted.    Skin: Warm, no lesions or rashes         Assessment & Plan:

## 2015-07-18 NOTE — Telephone Encounter (Signed)
Spoke with daughter and informed her of BQ rec Pt scheduled to see TP today for an acute visit at 3:45pm Appt ok'd by TP  Nothing further is needed at this time.

## 2015-07-18 NOTE — Assessment & Plan Note (Signed)
Recurrent exacerbation -slow to resolve -discussed hospital admission , would like to try OP tx, advised if not responding will need to be admitted for consideration of IV tx.  xopenex neb x 1  Depo medrol 80mg  IM x 1   Plan   Begin Ceftin 500mg  Twice daily  For 10 day -take with probiotic .  Continue on prednisone taper as directed.  Depo Medrol  shot today .  Follow up in 2-3 weeks w/ Dr. Kendrick FriesMcQuaid and As needed   Please contact office for sooner follow up if symptoms do not improve or worsen or seek emergency care

## 2015-07-18 NOTE — Telephone Encounter (Signed)
Call[facility] She reports pt is not feeling well. resp are 24-26, on 4 liters of O2 sats are 88-93% at rest, pain in bottom of rib cage, prod cough (yellow-green thick phlem), not able to take a good deep breathe. Pt is moving around slowly. Please advise Dr. Kendrick FriesMcQuaid thanks

## 2015-07-18 NOTE — Assessment & Plan Note (Signed)
Compensated on O2 at 4l/m ,

## 2015-07-18 NOTE — Telephone Encounter (Signed)
Needs to be seen. Ideally our office if able

## 2015-07-18 NOTE — Patient Instructions (Addendum)
Begin Ceftin 500mg  Twice daily  For 10 day -take with probiotic .  Continue on prednisone taper as directed.  Depo Medrol  shot today .  Follow up in 2-3 weeks w/ Dr. Kendrick FriesMcQuaid and As needed   Please contact office for sooner follow up if symptoms do not improve or worsen or seek emergency care

## 2015-07-18 NOTE — Telephone Encounter (Signed)
Tonya HaverWendy [name], daughter called.  She wants to know what to do with her mother.  If we can see her this afternoon or if she needs to take her to the ED.  She may be reached at [phone].

## 2015-07-19 NOTE — Progress Notes (Signed)
Reviewed with Tammy, I agree with this plan of care

## 2015-07-20 ENCOUNTER — Encounter (HOSPITAL_COMMUNITY): Payer: Self-pay | Admitting: Emergency Medicine

## 2015-07-20 ENCOUNTER — Telehealth: Payer: Self-pay | Admitting: Adult Health

## 2015-07-20 ENCOUNTER — Inpatient Hospital Stay (HOSPITAL_COMMUNITY): Payer: Medicare Other

## 2015-07-20 ENCOUNTER — Ambulatory Visit: Payer: Medicare Other | Admitting: Adult Health

## 2015-07-20 ENCOUNTER — Inpatient Hospital Stay (HOSPITAL_COMMUNITY)
Admission: EM | Admit: 2015-07-20 | Discharge: 2015-07-22 | DRG: 190 | Disposition: A | Discharge status: Hospice | Payer: Medicare Other | Attending: Internal Medicine | Admitting: Internal Medicine

## 2015-07-20 ENCOUNTER — Emergency Department (HOSPITAL_COMMUNITY): Payer: Medicare Other

## 2015-07-20 DIAGNOSIS — Z880 Allergy status to penicillin: Secondary | ICD-10-CM

## 2015-07-20 DIAGNOSIS — Z79899 Other long term (current) drug therapy: Secondary | ICD-10-CM

## 2015-07-20 DIAGNOSIS — Z66 Do not resuscitate: Secondary | ICD-10-CM | POA: Diagnosis present

## 2015-07-20 DIAGNOSIS — J9602 Acute respiratory failure with hypercapnia: Secondary | ICD-10-CM

## 2015-07-20 DIAGNOSIS — J9621 Acute and chronic respiratory failure with hypoxia: Secondary | ICD-10-CM

## 2015-07-20 DIAGNOSIS — R0902 Hypoxemia: Secondary | ICD-10-CM | POA: Diagnosis not present

## 2015-07-20 DIAGNOSIS — Z882 Allergy status to sulfonamides status: Secondary | ICD-10-CM

## 2015-07-20 DIAGNOSIS — R079 Chest pain, unspecified: Secondary | ICD-10-CM

## 2015-07-20 DIAGNOSIS — Z881 Allergy status to other antibiotic agents status: Secondary | ICD-10-CM

## 2015-07-20 DIAGNOSIS — Z7982 Long term (current) use of aspirin: Secondary | ICD-10-CM

## 2015-07-20 DIAGNOSIS — J449 Chronic obstructive pulmonary disease, unspecified: Secondary | ICD-10-CM

## 2015-07-20 DIAGNOSIS — Z515 Encounter for palliative care: Secondary | ICD-10-CM | POA: Diagnosis not present

## 2015-07-20 DIAGNOSIS — E876 Hypokalemia: Secondary | ICD-10-CM | POA: Diagnosis present

## 2015-07-20 DIAGNOSIS — K219 Gastro-esophageal reflux disease without esophagitis: Secondary | ICD-10-CM | POA: Diagnosis present

## 2015-07-20 DIAGNOSIS — J9620 Acute and chronic respiratory failure, unspecified whether with hypoxia or hypercapnia: Secondary | ICD-10-CM | POA: Diagnosis present

## 2015-07-20 DIAGNOSIS — J9622 Acute and chronic respiratory failure with hypercapnia: Secondary | ICD-10-CM

## 2015-07-20 DIAGNOSIS — E872 Acidosis: Secondary | ICD-10-CM | POA: Diagnosis present

## 2015-07-20 DIAGNOSIS — F419 Anxiety disorder, unspecified: Secondary | ICD-10-CM | POA: Diagnosis present

## 2015-07-20 DIAGNOSIS — J471 Bronchiectasis with (acute) exacerbation: Principal | ICD-10-CM | POA: Diagnosis present

## 2015-07-20 DIAGNOSIS — I10 Essential (primary) hypertension: Secondary | ICD-10-CM | POA: Diagnosis present

## 2015-07-20 DIAGNOSIS — Z888 Allergy status to other drugs, medicaments and biological substances status: Secondary | ICD-10-CM

## 2015-07-20 DIAGNOSIS — Z794 Long term (current) use of insulin: Secondary | ICD-10-CM | POA: Diagnosis not present

## 2015-07-20 DIAGNOSIS — J479 Bronchiectasis, uncomplicated: Secondary | ICD-10-CM

## 2015-07-20 DIAGNOSIS — J9690 Respiratory failure, unspecified, unspecified whether with hypoxia or hypercapnia: Secondary | ICD-10-CM | POA: Diagnosis present

## 2015-07-20 DIAGNOSIS — J441 Chronic obstructive pulmonary disease with (acute) exacerbation: Secondary | ICD-10-CM | POA: Diagnosis not present

## 2015-07-20 DIAGNOSIS — J9601 Acute respiratory failure with hypoxia: Secondary | ICD-10-CM

## 2015-07-20 HISTORY — DX: Dependence on supplemental oxygen: Z99.81

## 2015-07-20 HISTORY — DX: Anxiety disorder, unspecified: F41.9

## 2015-07-20 LAB — I-STAT ARTERIAL BLOOD GAS, ED
ACID-BASE EXCESS: 16 mmol/L — AB (ref 0.0–2.0)
BICARBONATE: 44.4 meq/L — AB (ref 20.0–24.0)
O2 SAT: 98 %
PCO2 ART: 69.5 mmHg — AB (ref 35.0–45.0)
PO2 ART: 115 mmHg — AB (ref 80.0–100.0)
TCO2: 47 mmol/L (ref 0–100)
pH, Arterial: 7.413 (ref 7.350–7.450)

## 2015-07-20 LAB — CBC WITH DIFFERENTIAL/PLATELET
BASOS ABS: 0 10*3/uL (ref 0.0–0.1)
BASOS PCT: 0 %
Eosinophils Absolute: 0.5 10*3/uL (ref 0.0–0.7)
Eosinophils Relative: 4 %
HEMATOCRIT: 44.6 % (ref 36.0–46.0)
HEMOGLOBIN: 14 g/dL (ref 12.0–15.0)
Lymphocytes Relative: 18 %
Lymphs Abs: 2.4 10*3/uL (ref 0.7–4.0)
MCH: 29.4 pg (ref 26.0–34.0)
MCHC: 31.4 g/dL (ref 30.0–36.0)
MCV: 93.7 fL (ref 78.0–100.0)
Monocytes Absolute: 1.2 10*3/uL — ABNORMAL HIGH (ref 0.1–1.0)
Monocytes Relative: 9 %
NEUTROS ABS: 9.3 10*3/uL — AB (ref 1.7–7.7)
NEUTROS PCT: 69 %
Platelets: 165 10*3/uL (ref 150–400)
RBC: 4.76 MIL/uL (ref 3.87–5.11)
RDW: 13.1 % (ref 11.5–15.5)
WBC: 13.4 10*3/uL — ABNORMAL HIGH (ref 4.0–10.5)

## 2015-07-20 LAB — CBC
HCT: 42.6 % (ref 36.0–46.0)
HEMOGLOBIN: 13.5 g/dL (ref 12.0–15.0)
MCH: 29.3 pg (ref 26.0–34.0)
MCHC: 31.7 g/dL (ref 30.0–36.0)
MCV: 92.6 fL (ref 78.0–100.0)
PLATELETS: 163 10*3/uL (ref 150–400)
RBC: 4.6 MIL/uL (ref 3.87–5.11)
RDW: 13 % (ref 11.5–15.5)
WBC: 9.2 10*3/uL (ref 4.0–10.5)

## 2015-07-20 LAB — I-STAT TROPONIN, ED: Troponin i, poc: 0 ng/mL (ref 0.00–0.08)

## 2015-07-20 LAB — BASIC METABOLIC PANEL
ANION GAP: 9 (ref 5–15)
BUN: 8 mg/dL (ref 6–20)
CALCIUM: 9.5 mg/dL (ref 8.9–10.3)
CO2: 39 mmol/L — AB (ref 22–32)
Chloride: 93 mmol/L — ABNORMAL LOW (ref 101–111)
Creatinine, Ser: 0.68 mg/dL (ref 0.44–1.00)
GFR calc Af Amer: >60 mL/min (ref >60)
Glucose, Bld: 128 mg/dL — ABNORMAL HIGH (ref 65–99)
POTASSIUM: 3.5 mmol/L (ref 3.5–5.1)
SODIUM: 141 mmol/L (ref 135–145)

## 2015-07-20 LAB — I-STAT CG4 LACTIC ACID, ED: LACTIC ACID, VENOUS: 2.27 mmol/L — AB (ref 0.5–2.0)

## 2015-07-20 LAB — BRAIN NATRIURETIC PEPTIDE: B Natriuretic Peptide: 17.7 pg/mL (ref 0.0–100.0)

## 2015-07-20 LAB — MRSA PCR SCREENING: MRSA BY PCR: NEGATIVE

## 2015-07-20 MED ORDER — B COMPLEX-C PO TABS
1.0000 | ORAL_TABLET | Freq: Every day | ORAL | Status: DC
  Administered 2015-07-21: 1 via ORAL
  Filled 2015-07-20: qty 1

## 2015-07-20 MED ORDER — BOOST / RESOURCE BREEZE PO LIQD
1.0000 | Freq: Three times a day (TID) | ORAL | Status: DC
  Administered 2015-07-20 – 2015-07-22 (×4): 1 via ORAL

## 2015-07-20 MED ORDER — CEFTRIAXONE SODIUM 1 G IJ SOLR
1.0000 g | INTRAMUSCULAR | Status: DC
  Administered 2015-07-20: 1 g via INTRAVENOUS
  Filled 2015-07-20: qty 10

## 2015-07-20 MED ORDER — SODIUM CHLORIDE 0.9 % IJ SOLN
3.0000 mL | Freq: Two times a day (BID) | INTRAMUSCULAR | Status: DC
Start: 2015-07-20 — End: 2015-07-22
  Administered 2015-07-20 – 2015-07-22 (×4): 3 mL via INTRAVENOUS

## 2015-07-20 MED ORDER — ENOXAPARIN SODIUM 30 MG/0.3ML ~~LOC~~ SOLN
30.0000 mg | SUBCUTANEOUS | Status: DC
  Administered 2015-07-20: 30 mg via SUBCUTANEOUS
  Filled 2015-07-20: qty 0.3

## 2015-07-20 MED ORDER — FLUTICASONE PROPIONATE 50 MCG/ACT NA SUSP
1.0000 | Freq: Every day | NASAL | Status: DC
  Administered 2015-07-21 – 2015-07-22 (×2): 1 via NASAL
  Filled 2015-07-20 (×2): qty 16

## 2015-07-20 MED ORDER — SACCHAROMYCES BOULARDII 250 MG PO CAPS: 250.0000 mg | ORAL_CAPSULE | Freq: Two times a day (BID) | ORAL | Status: DC

## 2015-07-20 MED ORDER — HYDROCODONE-ACETAMINOPHEN 5-325 MG PO TABS: 1.0000 | ORAL_TABLET | ORAL | Status: DC | PRN

## 2015-07-20 MED ORDER — METHYLPREDNISOLONE SODIUM SUCC 125 MG IJ SOLR
60.0000 mg | Freq: Four times a day (QID) | INTRAMUSCULAR | Status: DC
  Administered 2015-07-20 – 2015-07-21 (×3): 60 mg via INTRAVENOUS
  Filled 2015-07-20 (×3): qty 2

## 2015-07-20 MED ORDER — DEXTROSE 5 % IV SOLN
500.0000 mg | INTRAVENOUS | Status: DC
  Administered 2015-07-20: 500 mg via INTRAVENOUS
  Filled 2015-07-20: qty 500

## 2015-07-20 MED ORDER — ALBUTEROL SULFATE (2.5 MG/3ML) 0.083% IN NEBU
2.5000 mg | INHALATION_SOLUTION | Freq: Four times a day (QID) | RESPIRATORY_TRACT | Status: DC
  Administered 2015-07-20: 2.5 mg via RESPIRATORY_TRACT
  Filled 2015-07-20: qty 3

## 2015-07-20 MED ORDER — IPRATROPIUM BROMIDE 0.02 % IN SOLN
0.5000 mg | Freq: Three times a day (TID) | RESPIRATORY_TRACT | Status: DC
  Administered 2015-07-21 – 2015-07-22 (×4): 0.5 mg via RESPIRATORY_TRACT
  Filled 2015-07-20 (×4): qty 2.5

## 2015-07-20 MED ORDER — SODIUM CHLORIDE 0.9 % IV SOLN
1.0000 g | Freq: Two times a day (BID) | INTRAVENOUS | Status: DC
  Administered 2015-07-21: 1 g via INTRAVENOUS
  Filled 2015-07-20 (×3): qty 1

## 2015-07-20 MED ORDER — IOHEXOL 350 MG/ML SOLN
80.0000 mL | Freq: Once | INTRAVENOUS | Status: AC | PRN
  Administered 2015-07-20: 50 mL via INTRAVENOUS

## 2015-07-20 MED ORDER — IPRATROPIUM BROMIDE 0.02 % IN SOLN
0.5000 mg | Freq: Four times a day (QID) | RESPIRATORY_TRACT | Status: DC
  Administered 2015-07-20 (×2): 0.5 mg via RESPIRATORY_TRACT
  Filled 2015-07-20 (×2): qty 2.5

## 2015-07-20 MED ORDER — LEVALBUTEROL HCL 0.63 MG/3ML IN NEBU
0.6300 mg | INHALATION_SOLUTION | Freq: Three times a day (TID) | RESPIRATORY_TRACT | Status: DC
  Administered 2015-07-21 – 2015-07-22 (×4): 0.63 mg via RESPIRATORY_TRACT
  Filled 2015-07-20 (×4): qty 3

## 2015-07-20 MED ORDER — SALINE SPRAY 0.65 % NA SOLN: 1.0000 | Freq: Three times a day (TID) | NASAL | Status: DC | PRN

## 2015-07-20 MED ORDER — BENZONATATE 100 MG PO CAPS
200.0000 mg | ORAL_CAPSULE | Freq: Three times a day (TID) | ORAL | Status: DC
  Filled 2015-07-20: qty 2

## 2015-07-20 MED ORDER — ONDANSETRON HCL 4 MG/2ML IJ SOLN: 4.0000 mg | Freq: Four times a day (QID) | INTRAMUSCULAR | Status: DC | PRN

## 2015-07-20 MED ORDER — ACETAMINOPHEN 650 MG RE SUPP: 650.0000 mg | Freq: Four times a day (QID) | RECTAL | Status: DC | PRN

## 2015-07-20 MED ORDER — CLORAZEPATE DIPOTASSIUM 3.75 MG PO TABS
3.7500 mg | ORAL_TABLET | Freq: Two times a day (BID) | ORAL | Status: DC | PRN
  Administered 2015-07-21: 3.75 mg via ORAL
  Filled 2015-07-20: qty 1

## 2015-07-20 MED ORDER — MORPHINE SULFATE (PF) 2 MG/ML IV SOLN
1.0000 mg | INTRAVENOUS | Status: DC | PRN
  Administered 2015-07-21: 1 mg via INTRAVENOUS
  Filled 2015-07-20 (×2): qty 1

## 2015-07-20 MED ORDER — ALBUTEROL (5 MG/ML) CONTINUOUS INHALATION SOLN
10.0000 mg/h | INHALATION_SOLUTION | RESPIRATORY_TRACT | Status: DC
  Administered 2015-07-20: 10 mg/h via RESPIRATORY_TRACT
  Filled 2015-07-20 (×3): qty 20

## 2015-07-20 MED ORDER — B COMPLEX PO TABS: 1.0000 | ORAL_TABLET | Freq: Every day | ORAL | Status: DC

## 2015-07-20 MED ORDER — FAMOTIDINE 20 MG PO TABS
20.0000 mg | ORAL_TABLET | Freq: Every day | ORAL | Status: DC
  Administered 2015-07-20 – 2015-07-21 (×2): 20 mg via ORAL
  Filled 2015-07-20 (×2): qty 1

## 2015-07-20 MED ORDER — TOBRAMYCIN 300 MG/5ML IN NEBU
300.0000 mg | INHALATION_SOLUTION | Freq: Two times a day (BID) | RESPIRATORY_TRACT | Status: DC
  Administered 2015-07-20 – 2015-07-22 (×3): 300 mg via RESPIRATORY_TRACT
  Filled 2015-07-20 (×7): qty 5

## 2015-07-20 MED ORDER — SODIUM CHLORIDE 0.9 % IJ SOLN: 3.0000 mL | INTRAMUSCULAR | Status: DC | PRN

## 2015-07-20 MED ORDER — ASPIRIN EC 81 MG PO TBEC
81.0000 mg | DELAYED_RELEASE_TABLET | Freq: Every day | ORAL | Status: DC
  Administered 2015-07-20 – 2015-07-21 (×2): 81 mg via ORAL
  Filled 2015-07-20 (×2): qty 1

## 2015-07-20 MED ORDER — ADULT MULTIVITAMIN W/MINERALS CH
1.0000 | ORAL_TABLET | Freq: Every day | ORAL | Status: DC
  Administered 2015-07-20: 1 via ORAL
  Filled 2015-07-20: qty 1

## 2015-07-20 MED ORDER — LORATADINE 10 MG PO TABS
10.0000 mg | ORAL_TABLET | Freq: Every day | ORAL | Status: DC
  Filled 2015-07-20 (×2): qty 1

## 2015-07-20 MED ORDER — METHYLPREDNISOLONE SODIUM SUCC 40 MG IJ SOLR
40.0000 mg | Freq: Three times a day (TID) | INTRAMUSCULAR | Status: DC
  Administered 2015-07-20: 40 mg via INTRAVENOUS
  Filled 2015-07-20: qty 1

## 2015-07-20 MED ORDER — GUAIFENESIN ER 600 MG PO TB12
1200.0000 mg | ORAL_TABLET | Freq: Two times a day (BID) | ORAL | Status: DC | PRN
Start: 2015-07-20 — End: 2015-07-21

## 2015-07-20 MED ORDER — ACETAMINOPHEN 325 MG PO TABS: 650.0000 mg | ORAL_TABLET | Freq: Four times a day (QID) | ORAL | Status: DC | PRN

## 2015-07-20 MED ORDER — PANTOPRAZOLE SODIUM 40 MG PO TBEC
40.0000 mg | DELAYED_RELEASE_TABLET | Freq: Every day | ORAL | Status: DC
Start: 2015-07-21 — End: 2015-07-22
  Administered 2015-07-21 – 2015-07-22 (×2): 40 mg via ORAL
  Filled 2015-07-20 (×2): qty 1

## 2015-07-20 MED ORDER — LEVALBUTEROL HCL 0.63 MG/3ML IN NEBU
0.6300 mg | INHALATION_SOLUTION | RESPIRATORY_TRACT | Status: DC
  Administered 2015-07-20 (×2): 0.63 mg via RESPIRATORY_TRACT
  Filled 2015-07-20 (×2): qty 3

## 2015-07-20 MED ORDER — SODIUM CHLORIDE 0.9 % IV SOLN: 250.0000 mL | INTRAVENOUS | Status: DC | PRN

## 2015-07-20 MED ORDER — ONDANSETRON HCL 4 MG PO TABS: 4.0000 mg | ORAL_TABLET | Freq: Four times a day (QID) | ORAL | Status: DC | PRN

## 2015-07-20 NOTE — Consult Note (Signed)
Name: Tonya [name] MRN: [id number] DOB: Aug 17, 1945    ADMISSION DATE:  07/20/2015 CONSULTATION DATE:  07/20/15  REFERRING MD :  Dr. Sunnie Nielsenegalado  CHIEF COMPLAINT:  Dyspnea  BRIEF PATIENT DESCRIPTION: Worsening dyspnea, cough,  Wheezing, chest congestion, and tightness  SIGNIFICANT EVENTS  12/13: Seen at Pulmonary clinic  With dyspnea and started on oral ceftin, given one dose of IM depo medrol 80mg , and  xopenex nebulaizer 12/15: ED with worsening dyspnea and chest tightness   HISTORY OF PRESENT ILLNESS:   This is a 69 yo White female with a h/o idiopathic bronchiectasis who presents with worsening dyspnea, chest tightness and cough. Patient states that she woke up this morning with severe shortness of breath, chest tightness wheezing and a cough that is productive of yellow sputum. She took her home nebulizer but had no relieve. She then called EMS. EMS administered 2g of magnesium, 125mg  of solu-medrol and started her on continuous Atrovent and albuterol and brought to the ED. Upon ED arrival her O 2 saturation on 4L [place] was 93%. PCCM was consulted to assist with patient's management. She reports mild improvement in chest tightness and shortness of breath. Patient states that since her first exacerbation in December 2015, she hasn't really felt since then. She was recently seen at the pulmonary clinic and started on oral antibiotics, steroid taper and xopenex nebulizer. Her home O2 was also increased 11/22. She recently had an allergy work-up that was not significant except for a mild elevation in Aspergillus IgE antibody.    PAST MEDICAL HISTORY :   has a past medical history of Bronchiectasis; Hypertension; and GERD (gastroesophageal reflux disease).  has no past surgical history on file. Prior to Admission medications   Medication Sig Start Date End Date Taking? Authorizing Provider  albuterol (PROVENTIL) (2.5 MG/3ML) 0.083% nebulizer solution Take 3 mLs (2.5 mg total) by  nebulization every 6 (six) hours as needed for wheezing or shortness of breath. DX j47.1, j96.12 05/18/15  Yes Lupita Leashouglas B McQuaid, MD  aspirin EC 81 MG tablet Take 81 mg by mouth at bedtime.   Yes Historical Provider, MD  b complex vitamins tablet Take 1 tablet by mouth daily.    Yes Historical Provider, MD  benzonatate (TESSALON) 200 MG capsule Take 200 mg by mouth 3 (three) times daily.   Yes Historical Provider, MD  budesonide-formoterol (SYMBICORT) 160-4.5 MCG/ACT inhaler Take 2 puffs first thing in am and then another 2 puffs about 12 hours later. Patient taking differently: Inhale 2 puffs into the lungs every 12 (twelve) hours. Take 2 puffs first thing in am and then another 2 puffs about 12 hours later. 06/27/15  Yes Lupita Leashouglas B McQuaid, MD  Calcium Carb-Cholecalciferol (CALCIUM PLUS VITAMIN D3 PO) Take 1 tablet by mouth daily.    Yes Historical Provider, MD  cefUROXime (CEFTIN) 500 MG tablet Take 1 tablet (500 mg total) by mouth 2 (two) times daily. 07/18/15 07/28/15 Yes Tammy S Parrett, NP  cetirizine (ZYRTEC) 10 MG tablet Take 10 mg by mouth at bedtime as needed for allergies.   Yes Historical Provider, MD  clorazepate (TRANXENE) 3.75 MG tablet Take 3.75 mg by mouth 2 (two) times daily as needed for anxiety.  06/12/15  Yes Historical Provider, MD  famotidine (PEPCID) 20 MG tablet Take 20 mg by mouth at bedtime.   Yes Historical Provider, MD  fluticasone (FLONASE) 50 MCG/ACT nasal spray USE 1 TO 2 SPRAYS IN EACH NOSTRIL IN THE MORNING AND AT BEDTIME Patient taking differently: USE  1 TO 2 SPRAYS IN EACH NOSTRIL IN THE MORNING  AS NEEDED FOR CONGESTION  AND EVERY NIGHT AT BEDTIME 12/28/14  Yes Nyoka Cowden, MD  guaiFENesin (MUCINEX) 600 MG 12 hr tablet Take 1,200 mg by mouth 2 (two) times daily as needed for cough.    Yes Historical Provider, MD  HYDROcodone-acetaminophen (NORCO/VICODIN) 5-325 MG tablet Take 1 tablet by mouth every 4 (four) hours as needed for moderate pain.   Yes Historical  Provider, MD  levalbuterol [facility] HFA) 45 MCG/ACT inhaler Inhale 1-2 puffs into the lungs every 4 (four) hours as needed for wheezing. Patient taking differently: Inhale 2 puffs into the lungs every 4 (four) hours as needed for wheezing.  03/22/15  Yes Nyoka Cowden, MD  losartan-hydrochlorothiazide (HYZAAR) 100-12.5 MG per tablet Take 1 tablet by mouth daily.    Yes Historical Provider, MD  Multiple Vitamin (MULTIVITAMIN WITH MINERALS) TABS tablet Take 1 tablet by mouth at bedtime.   Yes Historical Provider, MD  OXYGEN Use 3L of O2 with activity as needed   Yes Historical Provider, MD  oxymetazoline (AFRIN) 0.05 % nasal spray 2 puffs twice daily for five days and as needed for nasal congestion 02/18/11  Yes Tammy S Parrett, NP  pantoprazole (PROTONIX) 40 MG tablet Take 30- 60 min before your first and last meals of the day Patient taking differently: Take 40 mg by mouth daily before breakfast. Take 30- 60 min before breakfast every morning 08/26/12  Yes Nyoka Cowden, MD  predniSONE (DELTASONE) 10 MG tablet 40mg  X 14 days,30mg  X14 days, 20mg  X 30 days,  10mg  X 30 days 06/27/15  Yes Lupita Leash, MD  Respiratory Therapy Supplies (FLUTTER) DEVI Use several times a day as needed for thick mucus, congestion, cough Patient taking differently: Use every hour as needed for thick mucus, congestion, and cough 02/18/11  Yes Tammy S Parrett, NP  sodium chloride (OCEAN) 0.65 % SOLN nasal spray Place 1 spray into both nostrils 3 (three) times daily as needed for congestion.   Yes Historical Provider, MD  cefUROXime (CEFTIN) 250 MG tablet Take 1 tablet (250 mg total) by mouth 2 (two) times daily with a meal. Patient not taking: Reported on 07/20/2015 06/27/15   Lupita Leash, MD  saccharomyces boulardii (FLORASTOR) 250 MG capsule Take 1 capsule (250 mg total) by mouth 2 (two) times daily. Patient not taking: Reported on 07/20/2015 05/03/15   Starleen Arms, MD   Allergies  Allergen Reactions  .  Alprazolam Other (See Comments)    REACTION: "I just feel funny"  . Metoclopramide Hcl Other (See Comments)    Either swelling or funny feeling  . Tramadol     REACTION: made pt "feel funny"  . Doxycycline Swelling and Rash     rash, redness in face, slight swelling in lips  . Levaquin [Levofloxacin] Swelling and Rash     redness in face, rash, slight swelling in lips  . Moxifloxacin Swelling and Rash    Slight swelling in lips  . Penicillins Rash    Has patient had a PCN reaction causing immediate rash, facial/tongue/throat swelling, SOB or lightheadedness with hypotension: Yes Has patient had a PCN reaction causing severe rash involving mucus membranes or skin necrosis: No Has patient had a PCN reaction that required hospitalization No Has patient had a PCN reaction occurring within the last 10 years: No If all of the above answers are "NO", then may proceed with Cephalosporin use.  . Sulfonamide Derivatives Swelling  and Rash    Slight swelling in lips    FAMILY HISTORY:  family history includes Emphysema in her mother; Lung cancer in her father. SOCIAL HISTORY:  reports that she has never smoked. She has never used smokeless tobacco. She reports that she does not drink alcohol.  REVIEW OF SYSTEMS:   Constitutional: Negative for fever, chills, but positive for weight loss, and malaise/fatigue HEENT: Negative for headache, difficulty swallowing  and sore throat Respiratory: Positive for SOB, cough, wheezing and sputum production  Cardiovascular: positive for chest tightness but negative for palpitations and leg swelling  Gastrointestinal: Negative for heartburn, nausea, vomiting, abdominal pain, diarrhea, constipation, blood in stool and melena.  Genitourinary: Negative for dysuria, urgency, frequency, hematuria and flank pain.  Musculoskeletal: Negative for myalgias, back pain, joint pain and falls.  Skin: Negative for itching and rash.  Neurological: Negative for dizziness,  tingling, tremors, sensory change, speech change, focal weakness, seizures, loss of consciousness, but positive for weakness.  Endo/Heme/Allergies:  Does not bruise/bleed easily, multiple medication allergies   VITAL SIGNS: Pulse Rate:  [82-106] 95 (12/15 1515) Resp:  [17-35] 30 (12/15 1515) BP: (109-140)/(47-65) 119/58 mmHg (12/15 1515) SpO2:  [87 %-99 %] 92 % (12/15 1515)  PHYSICAL EXAMINATION: General: Alert, well developed, in moderate-to-severe respiratory distress HEENT: /AT, PERRLA, Oral mucosa moist, trachea midline Neuro: Alert and oriented X 4, no focal deficits Cardiovascular: RRR, S1/S2, no MRG Lungs: Respirations labored,  Breath sounds diminished in all lung fields, scattered rhonchi in the bases bilaterally, no rales Abdomen: soft, non-tender, non-distended, +bs x4 Musculoskeletal: No joint deformities Skin: Afebrile, no rash, no cyanosis   Recent Labs Lab 07/20/15 1120  NA 141  K 3.5  CL 93*  CO2 39*  BUN 8  CREATININE 0.68  GLUCOSE 128*    Recent Labs Lab 07/20/15 1120  HGB 14.0  HCT 44.6  WBC 13.4*  PLT 165   Ct Angio Chest Pe W/cm &/or Wo Cm  07/20/2015  CLINICAL DATA:  Chronic shortness of breath, worsening over the past several months. History of bronchiectasis and hypertension. Evaluate for pulmonary embolism. EXAM: CT ANGIOGRAPHY CHEST WITH CONTRAST TECHNIQUE: Multidetector CT imaging of the chest was performed using the standard protocol during bolus administration of intravenous contrast. Multiplanar CT image reconstructions and MIPs were obtained to evaluate the vascular anatomy. CONTRAST:  50 cc Omnipaque 350 COMPARISON:  Chest radiograph-earlier same day ; chest CT- 07/22/2014 FINDINGS: Vascular Findings: There is suboptimal opacification of the pulmonary arterial system with the main pulmonary artery measuring only 153 Hounsfield units. Given this limitation, there are no discrete filling defects within the pulmonary arterial tree to the level  of the bilateral subsegmental pulmonary arteries. Evaluation of the distal subsegmental pulmonary arteries is degraded secondary to suboptimal vessel opacification. Normal caliber the main pulmonary artery. Coronary artery calcifications.  No pericardial effusion. Normal caliber of the thoracic aorta. Conventional configuration aortic arch. The branch vessels of the aortic arch appear widely patent throughout their imaged course. ---------------------------------------------------------------------------------- Nonvascular Findings: Re- demonstrated diffuse bronchial wall thickening with extensive mixed cylindrical and varicose bronchiectasis, most conspicuous within the bilateral upper lobes (Right greater than left) and bilateral lower lobes, left greater than right. There are scattered perihilar predominant areas of ill-defined ground-glass compatible with air trapping. There is minimal subsegmental atelectasis within the bilateral costophrenic angles inferior segment of the lingula. No discrete focal airspace opacities. No air bronchograms. No pleural effusion or pneumothorax. Minimal symmetric biapical pleural parenchymal thickening. No discrete pulmonary nodules. No mediastinal, hilar  axillary lymphadenopathy. Limited early arterial phase evaluation of the upper abdomen is normal. No acute or aggressive osseous abnormalities. Mild to moderate multilevel DDD throughout the mid in caudal aspects of the thoracic spine. Regional soft tissues appear normal. Normal appearance of the thyroid gland. A minimal amount of debris is noted within the cervical segment of the esophagus. Review of the MIP images confirms the above findings. IMPRESSION: 1. No acute cardiopulmonary disease. Specifically, no evidence of pulmonary embolism to the level of the bilateral subsegmental pulmonary arteries. 2. Re- demonstrated diffuse bronchial wall thickening with extensive mixed cylindrical and varicose bronchiectasis - again  suggestive of chronic ABPA. Given bronchial wall thickening, superimposed acute bronchiectasis could result in a similar appearance. No discrete focal airspace opacities to suggest pneumonia. 3. Scattered perihilar predominant areas of ill-defined ground-glass compatible with air trapping. Electronically Signed   By: Simonne Come M.D.   On: 07/20/2015 16:02   Dg Chest Port 1 View  07/20/2015  CLINICAL DATA:  Several month history of shortness of breath with increased symptoms today; history of COPD, chronic bronchiectasis common nonsmoker. EXAM: PORTABLE CHEST 1 VIEW COMPARISON:  PA and lateral chest x-ray of June 27, 2015 FINDINGS: The lungs remain hyperinflated. There is no focal infiltrate. The interstitial markings are coarse especially in the right infrahilar region. This is stable. There is no alveolar infiltrate, pleural effusion, or pneumothorax. The heart and pulmonary vascularity are normal. The mediastinum is normal in width. The bony thorax is unremarkable. IMPRESSION: COPD. Stable coarse lung markings in the infrahilar regions especially on the right likely reflect known bronchiectasis. There is no evidence of pneumonia nor CHF. Electronically Signed   By: David  [place] M.D.   On: 07/20/2015 12:40    ASSESSMENT / PLAN: This is a 69 yo White female presenting with an acute exacerbation of  Bronchiectasis and acute on chronic respiratory failure. Will start on empiric oral and inhaled antibiotics, IV steroids and continue home inhalers. Disease course and prognosis has been reviewed with patient and her family. She does not want to be intubated or resuscitated.   Plan -Supplemental oxygen and BiPAP as needed and titrate to maintain O2 saturation >90% -Inhaled tobramycin  Q 12H -Meropenem per pharmacy -Continue xopenex nebulizer q4h prn -Solu-medrol  Q6H  -Aggressive pulmonary hygiene -Chest PT and incentive spirometry      [facility]dal[facility] ANP-BC Pulmonary and Critical  Care Medicine [facility] Pager: [phone]  07/20/2015, 4:11 PM  Attending Note:  69 year old female with bronchiectasis that has not improved for the last year presenting with an exacerbation.  I reviewed chest CT myself, evidence of severe bronchiectasis.  Discussed with [facility]-MD and PCCM-NP.  Bronchiectasis by history:  - Inhaled tobra.  - Merrem  - Pan culture.  COPD:  - Home nebs.  - IV steroids.  - Xopenex.  Hypoxemia:  - Titrate O2 for sat of 88-92%.  - Home O2.  Acute on chronic respiratory failure:  - PRN BiPAP.  - DNR.  Patient seen and examined, agree with above note.  I dictated the care and orders written for this patient under my direction.  Alyson Reedy, MD [phone]

## 2015-07-20 NOTE — Telephone Encounter (Signed)
Spoke with pt's daughter, Ursula AlertWendi. States that pt is having a hard time with breathing. Took a breathing treatment this AM and is not helping. Reports increased SOB, chest tightness and wheezing. Denies coughing or fever. Pt has a pulse oximeter but her hands are always so cold that she can't get an accurate reading. Would like recommendations on what to do.  TP - please advise. Thanks.

## 2015-07-20 NOTE — Telephone Encounter (Signed)
Mother is being taken to hospital.

## 2015-07-20 NOTE — Consult Note (Signed)
ANTIBIOTIC CONSULT NOTE - INITIAL  Pharmacy Consult for Meropenem Indication: bronchiectasis  Allergies  Allergen Reactions  . Alprazolam Other (See Comments)    REACTION: "I just feel funny"  . Metoclopramide Hcl Other (See Comments)    Either swelling or funny feeling  . Tramadol     REACTION: made pt "feel funny"  . Doxycycline Swelling and Rash     rash, redness in face, slight swelling in lips  . Levaquin [Levofloxacin] Swelling and Rash     redness in face, rash, slight swelling in lips  . Moxifloxacin Swelling and Rash    Slight swelling in lips  . Penicillins Rash    Has patient had a PCN reaction causing immediate rash, facial/tongue/throat swelling, SOB or lightheadedness with hypotension: Yes Has patient had a PCN reaction causing severe rash involving mucus membranes or skin necrosis: No Has patient had a PCN reaction that required hospitalization No Has patient had a PCN reaction occurring within the last 10 years: No If all of the above answers are "NO", then may proceed with Cephalosporin use.  . Sulfonamide Derivatives Swelling and Rash    Slight swelling in lips    Patient Measurements: Weight = 44.9kg Height = 5'1''  Vital Signs: BP: 107/85 mmHg (12/15 1645) Pulse Rate: 92 (12/15 1645) Intake/Output from previous day:   Intake/Output from this shift: Total I/O In: -  Out: 600 [Urine:600]  Labs:  Recent Labs  07/20/15 1120  WBC 13.4*  HGB 14.0  PLT 165  CREATININE 0.68   Estimated Creatinine Clearance: 46.4 mL/min (by C-G formula based on Cr of 0.68).  Microbiology: No results found for this or any previous visit (from the past 720 hour(s)).  Medical History: Past Medical History  Diagnosis Date  . Bronchiectasis   . Hypertension   . GERD (gastroesophageal reflux disease)    Assessment: 69yof with h/o idiopathic bronchiectasis was seen in pulmonary clinic 12/13 with dyspnea. She was started on oral ceftin. Today she returns to the ED  with worsening SOB and chest tightness. Antibiotics will be broadened to meropenem. Renal function wnl.  Goal of Therapy:  Appropriate dosing  Plan:  1) Meropenem 1g IV q12 2) Follow renal function, LOT  Fredrik RiggerMarkle, Lexi Conaty Sue 07/20/2015,5:26 PM

## 2015-07-20 NOTE — H&P (Signed)
[facility] History and Physical  Tonya [name] NUU:[id number] DOB: 02-03-1946 DOA: 07/20/2015  Referring physician: Dr Madilyn Hookees PCP:  Duane Lopeoss, Alan, MD   Chief Complaint: SBO  HPI: Tonya MacadamiaSandra H [name] is a 69 y.o. female with PMH significant for bronchiectasis, chronic respiratory failure on 4 L of oxygen who presents complaining of worsening SOB over last week. She saw her primary pulmonologist two days prior to admission. She was prescribe antibiotics, and received a shot of medrol. She report worsening chest tightness and dyspnea this morning. She report productive cough. She denies fevers, does report chills. She denies abdominal pain, diarrhea.    Evaluation in the ED; PH 7.4, co2 69, Po2 115, lactic acid 2.2, chest x ray: COPD. Stable coarse lung markings in the infrahilar regions especially on the right likely reflect known bronchiectasis. There is no evidence of pneumonia nor CHF.   Review of Systems:  Negative, except as per HPI  Past Medical History  Diagnosis Date  . Bronchiectasis   . Hypertension   . GERD (gastroesophageal reflux disease)    No past surgical history on file. Social History:  reports that she has never smoked. She has never used smokeless tobacco. She reports that she does not drink alcohol. Her drug history is not on file.  Allergies  Allergen Reactions  . Alprazolam Other (See Comments)    REACTION: "I just feel funny"  . Metoclopramide Hcl Other (See Comments)    Either swelling or funny feeling  . Tramadol     REACTION: made pt "feel funny"  . Doxycycline Swelling and Rash     rash, redness in face, slight swelling in lips  . Levaquin [Levofloxacin] Swelling and Rash     redness in face, rash, slight swelling in lips  . Moxifloxacin Swelling and Rash    Slight swelling in lips  . Penicillins Rash    Has patient had a PCN reaction causing immediate rash, facial/tongue/throat swelling, SOB or lightheadedness with hypotension: Yes Has  patient had a PCN reaction causing severe rash involving mucus membranes or skin necrosis: No Has patient had a PCN reaction that required hospitalization No Has patient had a PCN reaction occurring within the last 10 years: No If all of the above answers are "NO", then may proceed with Cephalosporin use.  . Sulfonamide Derivatives Swelling and Rash    Slight swelling in lips    Family History  Problem Relation Age of Onset  . Emphysema Mother     smoker  . Lung cancer Father     Prior to Admission medications   Medication Sig Start Date End Date Taking? Authorizing Provider  albuterol (PROVENTIL) (2.5 MG/3ML) 0.083% nebulizer solution Take 3 mLs (2.5 mg total) by nebulization every 6 (six) hours as needed for wheezing or shortness of breath. DX j47.1, j96.12 05/18/15  Yes Lupita Leashouglas B McQuaid, MD  aspirin EC 81 MG tablet Take 81 mg by mouth at bedtime.   Yes Historical Provider, MD  b complex vitamins tablet Take 1 tablet by mouth daily.    Yes Historical Provider, MD  benzonatate (TESSALON) 200 MG capsule Take 200 mg by mouth 3 (three) times daily.   Yes Historical Provider, MD  budesonide-formoterol (SYMBICORT) 160-4.5 MCG/ACT inhaler Take 2 puffs first thing in am and then another 2 puffs about 12 hours later. Patient taking differently: Inhale 2 puffs into the lungs every 12 (twelve) hours. Take 2 puffs first thing in am and then another 2 puffs about 12 hours later. 06/27/15  Yes Lupita Leash, MD  Calcium Carb-Cholecalciferol (CALCIUM PLUS VITAMIN D3 PO) Take 1 tablet by mouth daily.    Yes Historical Provider, MD  cefUROXime (CEFTIN) 500 MG tablet Take 1 tablet (500 mg total) by mouth 2 (two) times daily. 07/18/15 07/28/15 Yes Tammy S Parrett, NP  cetirizine (ZYRTEC) 10 MG tablet Take 10 mg by mouth at bedtime as needed for allergies.   Yes Historical Provider, MD  clorazepate (TRANXENE) 3.75 MG tablet Take 3.75 mg by mouth 2 (two) times daily as needed for anxiety.  06/12/15  Yes  Historical Provider, MD  famotidine (PEPCID) 20 MG tablet Take 20 mg by mouth at bedtime.   Yes Historical Provider, MD  fluticasone (FLONASE) 50 MCG/ACT nasal spray USE 1 TO 2 SPRAYS IN EACH NOSTRIL IN THE MORNING AND AT BEDTIME Patient taking differently: USE 1 TO 2 SPRAYS IN EACH NOSTRIL IN THE MORNING  AS NEEDED FOR CONGESTION  AND EVERY NIGHT AT BEDTIME 12/28/14  Yes Nyoka Cowden, MD  guaiFENesin (MUCINEX) 600 MG 12 hr tablet Take 1,200 mg by mouth 2 (two) times daily as needed for cough.    Yes Historical Provider, MD  HYDROcodone-acetaminophen (NORCO/VICODIN) 5-325 MG tablet Take 1 tablet by mouth every 4 (four) hours as needed for moderate pain.   Yes Historical Provider, MD  levalbuterol [facility] HFA) 45 MCG/ACT inhaler Inhale 1-2 puffs into the lungs every 4 (four) hours as needed for wheezing. Patient taking differently: Inhale 2 puffs into the lungs every 4 (four) hours as needed for wheezing.  03/22/15  Yes Nyoka Cowden, MD  losartan-hydrochlorothiazide (HYZAAR) 100-12.5 MG per tablet Take 1 tablet by mouth daily.    Yes Historical Provider, MD  Multiple Vitamin (MULTIVITAMIN WITH MINERALS) TABS tablet Take 1 tablet by mouth at bedtime.   Yes Historical Provider, MD  OXYGEN Use 3L of O2 with activity as needed   Yes Historical Provider, MD  oxymetazoline (AFRIN) 0.05 % nasal spray 2 puffs twice daily for five days and as needed for nasal congestion 02/18/11  Yes Tammy S Parrett, NP  pantoprazole (PROTONIX) 40 MG tablet Take 30- 60 min before your first and last meals of the day Patient taking differently: Take 40 mg by mouth daily before breakfast. Take 30- 60 min before breakfast every morning 08/26/12  Yes Nyoka Cowden, MD  predniSONE (DELTASONE) 10 MG tablet 40mg  X 14 days,30mg  X14 days, 20mg  X 30 days,  10mg  X 30 days 06/27/15  Yes Lupita Leash, MD  Respiratory Therapy Supplies (FLUTTER) DEVI Use several times a day as needed for thick mucus, congestion, cough Patient taking  differently: Use every hour as needed for thick mucus, congestion, and cough 02/18/11  Yes Tammy S Parrett, NP  sodium chloride (OCEAN) 0.65 % SOLN nasal spray Place 1 spray into both nostrils 3 (three) times daily as needed for congestion.   Yes Historical Provider, MD  cefUROXime (CEFTIN) 250 MG tablet Take 1 tablet (250 mg total) by mouth 2 (two) times daily with a meal. Patient not taking: Reported on 07/20/2015 06/27/15   Lupita Leash, MD  saccharomyces boulardii (FLORASTOR) 250 MG capsule Take 1 capsule (250 mg total) by mouth 2 (two) times daily. Patient not taking: Reported on 07/20/2015 05/03/15   Starleen Arms, MD   Physical Exam: Filed Vitals:   07/20/15 1200 07/20/15 1223 07/20/15 1230 07/20/15 1341  BP: 110/62  109/51   Pulse: 95  82 103  Resp:   17 24  SpO2: 93% 96% 98% 93%    Wt Readings from Last 3 Encounters:  07/18/15 44.906 kg (99 lb)  06/27/15 45.36 kg (100 lb)  06/06/15 45 kg (99 lb 3.3 oz)    General:  Appears calm and Uss[place] accessory muscle to breath " chronic breathing patter per patient "  Eyes: PERRL, normal lids, irises & conjunctiva ENT: grossly normal hearing, lips & tongue Neck: no LAD, masses or thyromegaly Cardiovascular: RRR, no m/r/g. No LE edema. Telemetry: SR, no arrhythmias  Respiratory: Ussing accessory muscle to breath, bilateral ronchus, no wheezing.  Abdomen: soft, ntnd Skin: no rash or induration seen on limited exam Musculoskeletal: grossly normal tone BUE/BLE Psychiatric: grossly normal mood and affect, speech fluent and appropriate Neurologic: grossly non-focal.          Labs on Admission:  Basic Metabolic Panel:  Recent Labs Lab 07/20/15 1120  NA 141  K 3.5  CL 93*  CO2 39*  GLUCOSE 128*  BUN 8  CREATININE 0.68  CALCIUM 9.5   Liver Function Tests: No results for input(s): AST, ALT, ALKPHOS, BILITOT, PROT, ALBUMIN in the last 168 hours. No results for input(s): LIPASE, AMYLASE in the last 168 hours. No  results for input(s): AMMONIA in the last 168 hours. CBC:  Recent Labs Lab 07/20/15 1120  WBC 13.4*  NEUTROABS 9.3*  HGB 14.0  HCT 44.6  MCV 93.7  PLT 165   Cardiac Enzymes: No results for input(s): CKTOTAL, CKMB, CKMBINDEX, TROPONINI in the last 168 hours.  BNP (last 3 results)  Recent Labs  07/20/15 1120  BNP 17.7    ProBNP (last 3 results)  Recent Labs  12/30/14 1122  PROBNP 32.0    CBG: No results for input(s): GLUCAP in the last 168 hours.  Radiological Exams on Admission: Dg Chest Port 1 View  07/20/2015  CLINICAL DATA:  Several month history of shortness of breath with increased symptoms today; history of COPD, chronic bronchiectasis common nonsmoker. EXAM: PORTABLE CHEST 1 VIEW COMPARISON:  PA and lateral chest x-ray of June 27, 2015 FINDINGS: The lungs remain hyperinflated. There is no focal infiltrate. The interstitial markings are coarse especially in the right infrahilar region. This is stable. There is no alveolar infiltrate, pleural effusion, or pneumothorax. The heart and pulmonary vascularity are normal. The mediastinum is normal in width. The bony thorax is unremarkable. IMPRESSION: COPD. Stable coarse lung markings in the infrahilar regions especially on the right likely reflect known bronchiectasis. There is no evidence of pneumonia nor CHF. Electronically Signed   By: David  [place] M.D.   On: 07/20/2015 12:40    EKG: Independently reviewed. Sinus rhythm.   Assessment/Plan Principal Problem:   Acute on chronic respiratory failure (HCC) Active Problems:   Hypertension   Bronchiectasis without acute exacerbation (HCC)   Respiratory failure (HCC)  1-Acute on Chronic Hypoxic Hypercapnic Respiratory Failure;  Secondary to bronchiectasis exacerbation, vs progression of diseases.  ABG with compensated acidosis.  Will start IV solumedrol, nebulizer, IV antibiotics.  BIPAP PRN.  Guaifenesin.  Pulmonary consulted.  Due to chest tightness and no  improvement of symptoms on oral prednisone and antibiotics will proceed with CT angio rule out PE.  Patient wishes to be DNR, she agree to speak with palliative for goals of care and symptoms management.   2-HTN; SBP in the 100 range. Will hold diuretics at this time.    3-Mildly elevated lactic acid; suspect related to hypoxemia.         Code Status: DNR DVT Prophylaxis:  Lovenox.  Family Communication: Care discussed with family.  Disposition Plan: expect 3 to 4 days inpatient.   Time spent: 75 minutes.   Hartley Barefoot A [facility] Pager [phone]

## 2015-07-20 NOTE — ED Notes (Signed)
Per EMS- pt here from home. Normally wears 4L O2 [address]. Pt woke up this morning around 0745 feeling short of breathe with a chest tightness 10/10. Pt tried at home neb with no relief. EMS gave 2G of Mag, 125 Solumedrol, and pt arrives on continuous neb of Atrovent and albuterol. PIV 18G LAC. Breath sound with rhonchi.

## 2015-07-20 NOTE — Telephone Encounter (Signed)
Noted  

## 2015-07-20 NOTE — Progress Notes (Signed)
Patient is resting comfortably on 4L nasal cannula with no apparent signs of respiratory distress. BiPAP not placed on at this time. RT will continue to monitor.

## 2015-07-20 NOTE — ED Notes (Signed)
Pt still at CT.

## 2015-07-20 NOTE — ED Provider Notes (Signed)
CSN: [id number]     Arrival date & time 07/20/15  1115 History   First MD Initiated Contact with Patient 07/20/15 1116     Chief Complaint  Patient presents with  . Shortness of Breath    Patient is a 69 y.o. female presenting with shortness of breath. The history is provided by the patient and the EMS personnel. No language interpreter was used.  Shortness of Breath  RHILYNN PREYER is a 69 y.o. female who presents to the Emergency Department complaining of SOB.  She has a hx/o  bronchiectasis here for progressive SOB.  She was recently seen in the pulmonary clinic a few days ago and received a steroid shot and started on abx.  This morning at 0745 she awoke with severe worsening in her SOB that did not respond to home treatment.  She reports increased chest tightness and slight BLE edema. Sxs are minimally improved after treatment by EMS.  Hx is limited due to respiratory distress.  Received A and A, solumedrol, magnesium by EMS prior to ED arrival.    Past Medical History  Diagnosis Date  . Bronchiectasis   . Hypertension   . GERD (gastroesophageal reflux disease)    No past surgical history on file. Family History  Problem Relation Age of Onset  . Emphysema Mother     smoker  . Lung cancer Father    Social History  Substance Use Topics  . Smoking status: Never Smoker   . Smokeless tobacco: Never Used  . Alcohol Use: No   OB History    No data available     Review of Systems  Respiratory: Positive for shortness of breath.   All other systems reviewed and are negative.     Allergies  Alprazolam; Metoclopramide hcl; Tramadol; Doxycycline; Levaquin; Moxifloxacin; Penicillins; and Sulfonamide derivatives  Home Medications   Prior to Admission medications   Medication Sig Start Date End Date Taking? Authorizing Provider  albuterol (PROVENTIL) (2.5 MG/3ML) 0.083% nebulizer solution Take 3 mLs (2.5 mg total) by nebulization every 6 (six) hours as needed for wheezing  or shortness of breath. DX j47.1, j96.12 05/18/15   Lupita Leash, MD  aspirin EC 81 MG tablet Take 81 mg by mouth at bedtime.    Historical Provider, MD  b complex vitamins tablet Take 1 tablet by mouth daily.     Historical Provider, MD  budesonide-formoterol (SYMBICORT) 160-4.5 MCG/ACT inhaler Take 2 puffs first thing in am and then another 2 puffs about 12 hours later. 06/27/15   Lupita Leash, MD  Calcium Carb-Cholecalciferol (CALCIUM PLUS VITAMIN D3 PO) Take 1 tablet by mouth daily.     Historical Provider, MD  cefUROXime (CEFTIN) 250 MG tablet Take 1 tablet (250 mg total) by mouth 2 (two) times daily with a meal. Patient not taking: Reported on 07/18/2015 06/27/15   Lupita Leash, MD  cefUROXime (CEFTIN) 500 MG tablet Take 1 tablet (500 mg total) by mouth 2 (two) times daily. 07/18/15 07/28/15  Tammy S Parrett, NP  clorazepate (TRANXENE) 3.75 MG tablet Take 3.75 mg by mouth 2 (two) times daily as needed. 06/12/15   Historical Provider, MD  Dextromethorphan-Guaifenesin (MUCINEX DM MAXIMUM STRENGTH) 60-1200 MG TB12 Take 1 tablet by mouth 2 (two) times daily as needed (congestion/cough).    Historical Provider, MD  famotidine (PEPCID) 20 MG tablet Take 20 mg by mouth at bedtime.    Historical Provider, MD  fluticasone (FLONASE) 50 MCG/ACT nasal spray USE 1 TO 2 SPRAYS  IN EACH NOSTRIL IN THE MORNING AND AT BEDTIME Patient taking differently: USE 1 TO 2 SPRAYS IN EACH NOSTRIL IN THE MORNING  AS NEEDED FOR CONGESTION  AND EVERY NIGHT AT BEDTIME 12/28/14   Nyoka Cowden, MD  guaiFENesin (MUCINEX) 600 MG 12 hr tablet Take 1,200 mg by mouth 2 (two) times daily.    Historical Provider, MD  levalbuterol [facility] HFA) 45 MCG/ACT inhaler Inhale 1-2 puffs into the lungs every 4 (four) hours as needed for wheezing. Patient not taking: Reported on 07/18/2015 03/22/15   Nyoka Cowden, MD  losartan-hydrochlorothiazide [facility]) 100-12.5 MG per tablet Take 1 tablet by mouth daily.     Historical  Provider, MD  Multiple Vitamin (MULTIVITAMIN WITH MINERALS) TABS tablet Take 1 tablet by mouth at bedtime.    Historical Provider, MD  OXYGEN Inhale 2 L into the lungs at bedtime.    Historical Provider, MD  OXYGEN Use 3L of O2 with activity as needed    Historical Provider, MD  oxymetazoline (AFRIN) 0.05 % nasal spray 2 puffs twice daily for five days and as needed for nasal congestion 02/18/11   Tammy S Parrett, NP  pantoprazole (PROTONIX) 40 MG tablet Take 30- 60 min before your first and last meals of the day Patient taking differently: Take 40 mg by mouth daily before breakfast. Take 30- 60 min before breakfast every morning 08/26/12   Nyoka Cowden, MD  predniSONE (DELTASONE) 10 MG tablet 40mg  X 14 days,30mg  X14 days, 20mg  X 30 days,  10mg  X 30 days 06/27/15   Lupita Leash, MD  Respiratory Therapy Supplies (FLUTTER) DEVI Use several times a day as needed for thick mucus, congestion, cough Patient taking differently: Use every hour as needed for thick mucus, congestion, and cough 02/18/11   Tammy S Parrett, NP  saccharomyces boulardii (FLORASTOR) 250 MG capsule Take 1 capsule (250 mg total) by mouth 2 (two) times daily. 05/03/15   Leana Roe [name], MD   There were no vitals taken for this visit. Physical Exam  Constitutional: She is oriented to person, place, and time. She appears well-developed. She appears distressed.  frail  HENT:  Head: Normocephalic and atraumatic.  Cardiovascular: Normal rate and regular rhythm.   No murmur heard. Pulmonary/Chest: She is in respiratory distress.  Accessory muscle use, decreased air movement bilaterally.  Minimally verbal  Abdominal: Soft. There is no tenderness. There is no rebound and no guarding.  Musculoskeletal: She exhibits no tenderness.  Trace edema in BLE  Neurological: She is alert and oriented to person, place, and time.  Skin: Skin is warm and dry.  Psychiatric: She has a normal mood and affect. Her behavior is normal.  Nursing  note and vitals reviewed.   ED Course  Procedures (including critical care time) Labs Review Labs Reviewed  BASIC METABOLIC PANEL - Abnormal; Notable for the following:    Chloride 93 (*)    CO2 39 (*)    Glucose, Bld 128 (*)    All other components within normal limits  CBC WITH DIFFERENTIAL/PLATELET - Abnormal; Notable for the following:    WBC 13.4 (*)    Neutro Abs 9.3 (*)    Monocytes Absolute 1.2 (*)    All other components within normal limits  I-STAT ARTERIAL BLOOD GAS, ED - Abnormal; Notable for the following:    pCO2 arterial 69.5 (*)    pO2, Arterial 115.0 (*)    Bicarbonate 44.4 (*)    Acid-Base Excess 16.0 (*)  All other components within normal limits  I-STAT CG4 LACTIC ACID, ED - Abnormal; Notable for the following:    Lactic Acid, Venous 2.27 (*)    All other components within normal limits  BRAIN NATRIURETIC PEPTIDE  I-STAT TROPOININ, ED    Imaging Review Ct Angio Chest Pe W/cm &/or Wo Cm  07/20/2015  CLINICAL DATA:  Chronic shortness of breath, worsening over the past several months. History of bronchiectasis and hypertension. Evaluate for pulmonary embolism. EXAM: CT ANGIOGRAPHY CHEST WITH CONTRAST TECHNIQUE: Multidetector CT imaging of the chest was performed using the standard protocol during bolus administration of intravenous contrast. Multiplanar CT image reconstructions and MIPs were obtained to evaluate the vascular anatomy. CONTRAST:  50 cc Omnipaque 350 COMPARISON:  Chest radiograph-earlier same day ; chest CT- 07/22/2014 FINDINGS: Vascular Findings: There is suboptimal opacification of the pulmonary arterial system with the main pulmonary artery measuring only 153 Hounsfield units. Given this limitation, there are no discrete filling defects within the pulmonary arterial tree to the level of the bilateral subsegmental pulmonary arteries. Evaluation of the distal subsegmental pulmonary arteries is degraded secondary to suboptimal vessel opacification.  Normal caliber the main pulmonary artery. Coronary artery calcifications.  No pericardial effusion. Normal caliber of the thoracic aorta. Conventional configuration aortic arch. The branch vessels of the aortic arch appear widely patent throughout their imaged course. ---------------------------------------------------------------------------------- Nonvascular Findings: Re- demonstrated diffuse bronchial wall thickening with extensive mixed cylindrical and varicose bronchiectasis, most conspicuous within the bilateral upper lobes (Right greater than left) and bilateral lower lobes, left greater than right. There are scattered perihilar predominant areas of ill-defined ground-glass compatible with air trapping. There is minimal subsegmental atelectasis within the bilateral costophrenic angles inferior segment of the lingula. No discrete focal airspace opacities. No air bronchograms. No pleural effusion or pneumothorax. Minimal symmetric biapical pleural parenchymal thickening. No discrete pulmonary nodules. No mediastinal, hilar axillary lymphadenopathy. Limited early arterial phase evaluation of the upper abdomen is normal. No acute or aggressive osseous abnormalities. Mild to moderate multilevel DDD throughout the mid in caudal aspects of the thoracic spine. Regional soft tissues appear normal. Normal appearance of the thyroid gland. A minimal amount of debris is noted within the cervical segment of the esophagus. Review of the MIP images confirms the above findings. IMPRESSION: 1. No acute cardiopulmonary disease. Specifically, no evidence of pulmonary embolism to the level of the bilateral subsegmental pulmonary arteries. 2. Re- demonstrated diffuse bronchial wall thickening with extensive mixed cylindrical and varicose bronchiectasis - again suggestive of chronic ABPA. Given bronchial wall thickening, superimposed acute bronchiectasis could result in a similar appearance. No discrete focal airspace opacities to  suggest pneumonia. 3. Scattered perihilar predominant areas of ill-defined ground-glass compatible with air trapping. Electronically Signed   By: Simonne Come M.D.   On: 07/20/2015 16:02   Dg Chest Port 1 View  07/20/2015  CLINICAL DATA:  Several month history of shortness of breath with increased symptoms today; history of COPD, chronic bronchiectasis common nonsmoker. EXAM: PORTABLE CHEST 1 VIEW COMPARISON:  PA and lateral chest x-ray of June 27, 2015 FINDINGS: The lungs remain hyperinflated. There is no focal infiltrate. The interstitial markings are coarse especially in the right infrahilar region. This is stable. There is no alveolar infiltrate, pleural effusion, or pneumothorax. The heart and pulmonary vascularity are normal. The mediastinum is normal in width. The bony thorax is unremarkable. IMPRESSION: COPD. Stable coarse lung markings in the infrahilar regions especially on the right likely reflect known bronchiectasis. There is no evidence of pneumonia  nor CHF. Electronically Signed   By: David  [place]il[place]Jordan M.D.   On: 07/20/2015 12:40   I have personally reviewed and evaluated these images and lab results as part of my medical decision-making.   EKG Interpretation None      MDM   Final diagnoses:  Acute respiratory failure with hypoxia and hypercapnia (HCC)    Pt with hx/o bronchiectasis here with progressive SOB despite treatment at home.  She is in significant respiratory distress in the ED with accessory muscle use.  Pt and family state that she would not want intubation/ventilator for her illness.  Continuing supportive care for progressive respiratory failure.  Pulmonary, Hospitalist, and Palliative care consulted.  Pt and family updated of critical nature of illness and that she has a very poor prognosis given her advanced disease and respiratory distress.      Tilden FossaElizabeth Arora Coakley, MD 07/20/15 [phone]

## 2015-07-20 NOTE — Progress Notes (Signed)
Received from ED. CHG bath by tech; on CCM notified monitor tech of adm.VSS. Family present at bedside.

## 2015-07-20 NOTE — ED Notes (Signed)
RT at bedside.

## 2015-07-20 NOTE — Progress Notes (Signed)
RN given ABG results.   Earlier w/ MD in room, noted pt w/ increased WOB however pt states she "gets like that after moving" and felt that her breathing was slightly improving.  RN started neb, Noted [facility] w/ crackles.  RN and MD aware.  BIPAP on standby if needed.  ED RT aware.

## 2015-07-20 NOTE — ED Notes (Signed)
Radiology called for portable XRAY

## 2015-07-21 DIAGNOSIS — J441 Chronic obstructive pulmonary disease with (acute) exacerbation: Secondary | ICD-10-CM

## 2015-07-21 DIAGNOSIS — Z515 Encounter for palliative care: Secondary | ICD-10-CM | POA: Insufficient documentation

## 2015-07-21 DIAGNOSIS — J9620 Acute and chronic respiratory failure, unspecified whether with hypoxia or hypercapnia: Secondary | ICD-10-CM

## 2015-07-21 LAB — CBC
HCT: 41 % (ref 36.0–46.0)
HEMOGLOBIN: 13.2 g/dL (ref 12.0–15.0)
MCH: 29.7 pg (ref 26.0–34.0)
MCHC: 32.2 g/dL (ref 30.0–36.0)
MCV: 92.1 fL (ref 78.0–100.0)
PLATELETS: 168 10*3/uL (ref 150–400)
RBC: 4.45 MIL/uL (ref 3.87–5.11)
RDW: 13 % (ref 11.5–15.5)
WBC: 10.5 10*3/uL (ref 4.0–10.5)

## 2015-07-21 LAB — BASIC METABOLIC PANEL
ANION GAP: 8 (ref 5–15)
BUN: 10 mg/dL (ref 6–20)
CALCIUM: 9 mg/dL (ref 8.9–10.3)
CO2: 41 mmol/L — ABNORMAL HIGH (ref 22–32)
CREATININE: 0.52 mg/dL (ref 0.44–1.00)
Chloride: 92 mmol/L — ABNORMAL LOW (ref 101–111)
Glucose, Bld: 99 mg/dL (ref 65–99)
Potassium: 3.4 mmol/L — ABNORMAL LOW (ref 3.5–5.1)
SODIUM: 141 mmol/L (ref 135–145)

## 2015-07-21 MED ORDER — LEVALBUTEROL HCL 0.63 MG/3ML IN NEBU: 0.6300 mg | INHALATION_SOLUTION | Freq: Four times a day (QID) | RESPIRATORY_TRACT | Status: DC | PRN

## 2015-07-21 MED ORDER — LORAZEPAM 2 MG/ML IJ SOLN: 0.5000 mg | INTRAMUSCULAR | Status: DC | PRN

## 2015-07-21 MED ORDER — CETYLPYRIDINIUM CHLORIDE 0.05 % MT LIQD
7.0000 mL | Freq: Two times a day (BID) | OROMUCOSAL | Status: DC
  Administered 2015-07-21: 7 mL via OROMUCOSAL

## 2015-07-21 MED ORDER — POTASSIUM CHLORIDE CRYS ER 20 MEQ PO TBCR
40.0000 meq | EXTENDED_RELEASE_TABLET | Freq: Once | ORAL | Status: AC
  Administered 2015-07-21: 40 meq via ORAL
  Filled 2015-07-21: qty 2

## 2015-07-21 MED ORDER — MORPHINE SULFATE (PF) 2 MG/ML IV SOLN
2.0000 mg | INTRAVENOUS | Status: DC
  Administered 2015-07-21 – 2015-07-22 (×7): 2 mg via INTRAVENOUS
  Filled 2015-07-21 (×5): qty 1

## 2015-07-21 MED ORDER — DEXAMETHASONE SODIUM PHOSPHATE 4 MG/ML IJ SOLN
8.0000 mg | Freq: Every morning | INTRAMUSCULAR | Status: DC
  Administered 2015-07-21 – 2015-07-22 (×2): 8 mg via INTRAVENOUS
  Filled 2015-07-21 (×3): qty 2

## 2015-07-21 MED ORDER — MORPHINE SULFATE (PF) 2 MG/ML IV SOLN
2.0000 mg | INTRAVENOUS | Status: DC | PRN
  Administered 2015-07-21 – 2015-07-22 (×2): 2 mg via INTRAVENOUS
  Filled 2015-07-21 (×4): qty 1

## 2015-07-21 NOTE — Consult Note (Signed)
Consultation Note Date: 07/21/2015   Patient Name: Tonya [name]  DOB: 02-03-1946  MRN: [id number]  Age / Sex: 69 y.o., female  PCP: Gildardo Cranker, MD Referring Physician: Osvaldo Shipper, MD  Reason for Consultation: Establishing goals of care, Hospice Evaluation, Inpatient hospice referral, Non pain symptom management and Psychosocial/spiritual support    Clinical Assessment/Narrative: Pt is a 69 yo female with idiopathic bronchiectasis admitted with severe dyspnea. Pt and family report a severe decline since Sept. She was dx approx 13 years ago but her first exacerbation was Dec of 2015. She tells her family she is tired  Contacts/Participants in Discussion: Primary Decision Maker: Pt at this point can speak for herself. Daughters and husband would speak in the event she cannot speak for herself   Relationship to Patient see above HCPOA: yes  Husband, Jake Shark, daughters Sunday Corn and 2 sisters, Olegario Messier and BeBe  SUMMARY OF RECOMMENDATIONS Comfort care Transfer to in-pt hospice Transfer out of step down unit until she can be transferred to in-pt hospice DC cardiac monitoring DC antibiotics No Bipap Initiate comfort meds  Code Status/Advance Care Planning: DNR    Code Status Orders        Start     Ordered   07/20/15 1529  Do not attempt resuscitation (DNR)   Continuous    Question Answer Comment  In the event of cardiac or respiratory ARREST Do not call a "code blue"   In the event of cardiac or respiratory ARREST Do not perform Intubation, CPR, defibrillation or ACLS   In the event of cardiac or respiratory ARREST Use medication by any route, position, wound care, and other measures to relive pain and suffering. May use oxygen, suction and manual treatment of airway obstruction as needed for comfort.      07/20/15 1528      Other Directives:None  Symptom Management:   Dyspnea: Start  scheduled MS04  q4 ATC and 2-4 q2 prn. DC solumedrol. Start decadron  daily which can be continued at hospice  Anxiety: Cont tranxene BID prn but will add ativan 0.5-1 mg q 4 prn for acute anxiety    Palliative Prophylaxis:   Aspiration, Bowel Regimen, Frequent Pain Assessment and Turn Reposition  Additional Recommendations (Limitations, Scope, Preferences):  Full Comfort Care   Psycho-social/Spiritual:  Support System: Strong Desire for further Chaplaincy support:no Additional Recommendations: Grief/Bereavement Support  Prognosis: < 4 weeks  Discharge Planning: Hospice facility   Chief Complaint/ Primary Diagnoses: Present on Admission:  . Respiratory failure (HCC) . Acute on chronic respiratory failure (HCC) . Hypertension  I have reviewed the medical record, interviewed the patient and family, and examined the patient. The following aspects are pertinent.  Past Medical History  Diagnosis Date  . Bronchiectasis   . Hypertension   . GERD (gastroesophageal reflux disease)   . On home oxygen therapy     "4L; 24/7" (07/20/2015)  . Anxiety    Social History   Social History  . Marital Status: Married    Spouse Name: N/A  . Number of Children: 2  . Years of Education: N/A   Occupational History  . Gaffer    Social History Main Topics  . Smoking status: Never Smoker   . Smokeless tobacco: Never Used  . Alcohol Use: No  . Drug Use: No  . Sexual Activity: Not Asked   Other Topics Concern  . None   Social History Narrative   Family History  Problem Relation Age of Onset  . Emphysema  Mother     smoker  . Lung cancer Father    Scheduled Meds: . aspirin EC  81 mg Oral QHS  . dexamethasone  8 mg Intravenous q morning - 10a  . famotidine  20 mg Oral QHS  . feeding supplement  1 Container Oral TID BM  . fluticasone  1 spray Each Nare Daily  . ipratropium  0.5 mg Nebulization TID  . levalbuterol  0.63 mg Nebulization TID  . loratadine   10 mg Oral Daily  .  morphine injection  2 mg Intravenous 6 times per day  . pantoprazole  40 mg Oral QAC breakfast  . sodium chloride  3 mL Intravenous Q12H  . tobramycin (PF)  300 mg Nebulization BID   Continuous Infusions:  PRN Meds:.sodium chloride, acetaminophen **OR** acetaminophen, clorazepate, levalbuterol, LORazepam, morphine injection, ondansetron **OR** ondansetron (ZOFRAN) IV, sodium chloride, sodium chloride Medications Prior to Admission:  Prior to Admission medications   Medication Sig Start Date End Date Taking? Authorizing Provider  albuterol (PROVENTIL) (2.5 MG/3ML) 0.083% nebulizer solution Take 3 mLs (2.5 mg total) by nebulization every 6 (six) hours as needed for wheezing or shortness of breath. DX j47.1, j96.12 05/18/15  Yes Lupita Leashouglas B McQuaid, MD  aspirin EC 81 MG tablet Take 81 mg by mouth at bedtime.   Yes Historical Provider, MD  b complex vitamins tablet Take 1 tablet by mouth daily.    Yes Historical Provider, MD  benzonatate (TESSALON) 200 MG capsule Take 200 mg by mouth 3 (three) times daily.   Yes Historical Provider, MD  budesonide-formoterol (SYMBICORT) 160-4.5 MCG/ACT inhaler Take 2 puffs first thing in am and then another 2 puffs about 12 hours later. Patient taking differently: Inhale 2 puffs into the lungs every 12 (twelve) hours. Take 2 puffs first thing in am and then another 2 puffs about 12 hours later. 06/27/15  Yes Lupita Leashouglas B McQuaid, MD  Calcium Carb-Cholecalciferol (CALCIUM PLUS VITAMIN D3 PO) Take 1 tablet by mouth daily.    Yes Historical Provider, MD  cefUROXime (CEFTIN) 500 MG tablet Take 1 tablet (500 mg total) by mouth 2 (two) times daily. 07/18/15 07/28/15 Yes Tammy S Parrett, NP  cetirizine (ZYRTEC) 10 MG tablet Take 10 mg by mouth at bedtime as needed for allergies.   Yes Historical Provider, MD  clorazepate (TRANXENE) 3.75 MG tablet Take 3.75 mg by mouth 2 (two) times daily as needed for anxiety.  06/12/15  Yes Historical Provider, MD    famotidine (PEPCID) 20 MG tablet Take 20 mg by mouth at bedtime.   Yes Historical Provider, MD  fluticasone (FLONASE) 50 MCG/ACT nasal spray USE 1 TO 2 SPRAYS IN EACH NOSTRIL IN THE MORNING AND AT BEDTIME Patient taking differently: USE 1 TO 2 SPRAYS IN EACH NOSTRIL IN THE MORNING  AS NEEDED FOR CONGESTION  AND EVERY NIGHT AT BEDTIME 12/28/14  Yes Nyoka CowdenMichael B Wert, MD  guaiFENesin (MUCINEX) 600 MG 12 hr tablet Take 1,200 mg by mouth 2 (two) times daily as needed for cough.    Yes Historical Provider, MD  HYDROcodone-acetaminophen (NORCO/VICODIN) 5-325 MG tablet Take 1 tablet by mouth every 4 (four) hours as needed for moderate pain.   Yes Historical Provider, MD  levalbuterol [facility](XOPENEX HFA) 45 MCG/ACT inhaler Inhale 1-2 puffs into the lungs every 4 (four) hours as needed for wheezing. Patient taking differently: Inhale 2 puffs into the lungs every 4 (four) hours as needed for wheezing.  03/22/15  Yes Nyoka CowdenMichael B Wert, MD  losartan-hydrochlorothiazide (HYZAAR) 100-12.5 MG  per tablet Take 1 tablet by mouth daily.    Yes Historical Provider, MD  Multiple Vitamin (MULTIVITAMIN WITH MINERALS) TABS tablet Take 1 tablet by mouth at bedtime.   Yes Historical Provider, MD  OXYGEN Use 3L of O2 with activity as needed   Yes Historical Provider, MD  oxymetazoline (AFRIN) 0.05 % nasal spray 2 puffs twice daily for five days and as needed for nasal congestion 02/18/11  Yes Tammy S Parrett, NP  pantoprazole (PROTONIX) 40 MG tablet Take 30- 60 min before your first and last meals of the day Patient taking differently: Take 40 mg by mouth daily before breakfast. Take 30- 60 min before breakfast every morning 08/26/12  Yes Nyoka Cowden, MD  predniSONE (DELTASONE) 10 MG tablet  X 14 days,30mg  X14 days,  X 30 days,   X 30 days 06/27/15  Yes Lupita Leash, MD  Respiratory Therapy Supplies (FLUTTER) DEVI Use several times a day as needed for thick mucus, congestion, cough Patient taking differently: Use every  hour as needed for thick mucus, congestion, and cough 02/18/11  Yes Tammy S Parrett, NP  sodium chloride (OCEAN) 0.65 % SOLN nasal spray Place 1 spray into both nostrils 3 (three) times daily as needed for congestion.   Yes Historical Provider, MD  cefUROXime (CEFTIN) 250 MG tablet Take 1 tablet (250 mg total) by mouth 2 (two) times daily with a meal. Patient not taking: Reported on 07/20/2015 06/27/15   Lupita Leash, MD  saccharomyces boulardii (FLORASTOR) 250 MG capsule Take 1 capsule (250 mg total) by mouth 2 (two) times daily. Patient not taking: Reported on 07/20/2015 05/03/15   Starleen Arms, MD   Allergies  Allergen Reactions  . Alprazolam Other (See Comments)    REACTION: "I just feel funny"  . Metoclopramide Hcl Other (See Comments)    Either swelling or funny feeling  . Tramadol     REACTION: made pt "feel funny"  . Doxycycline Swelling and Rash     rash, redness in face, slight swelling in lips  . Levaquin [Levofloxacin] Swelling and Rash     redness in face, rash, slight swelling in lips  . Moxifloxacin Swelling and Rash    Slight swelling in lips  . Penicillins Rash    Has patient had a PCN reaction causing immediate rash, facial/tongue/throat swelling, SOB or lightheadedness with hypotension: Yes Has patient had a PCN reaction causing severe rash involving mucus membranes or skin necrosis: No Has patient had a PCN reaction that required hospitalization No Has patient had a PCN reaction occurring within the last 10 years: No If all of the above answers are "NO", then may proceed with Cephalosporin use.  . Sulfonamide Derivatives Swelling and Rash    Slight swelling in lips    Review of Systems  Unable to perform ROS: Severe respiratory distress    Physical Exam  Constitutional: She is oriented to person, place, and time.  cachetic  HENT:  Head: Normocephalic and atraumatic.  Neck: Normal range of motion.  Cardiovascular:  Tachy, irrg  Respiratory:    Increased work of breathing. 2-3 word dyspnea  Musculoskeletal: Normal range of motion.  Neurological: She is alert and oriented to person, place, and time.  Skin: Skin is warm and dry.  Psychiatric:  anxious    Vital Signs: BP 138/65 mmHg  Pulse 88  Temp(Src) 97.8 F (36.6 C) (Oral)  Resp 23  Wt 44.906 kg (99 lb)  SpO2 93%  SpO2: SpO2: 93 %  O2 Device:SpO2: 93 % O2 Flow Rate: .O2 Flow Rate (L/min): 4 L/min  IO: Intake/output summary:  Intake/Output Summary (Last 24 hours) at 07/21/15 1350 Last data filed at 07/21/15 0900  Gross per 24 hour  Intake    460 ml  Output    850 ml  Net   -390 ml    LBM: Last BM Date: 07/20/15 Baseline Weight: Weight: 44.906 kg (99 lb) Most recent weight: Weight: 44.906 kg (99 lb)      Palliative Assessment/Data:  Flowsheet Rows        Most Recent Value   Intake Tab    Referral Department  Hospitalist   Unit at Time of Referral  Intermediate Care Unit   Palliative Care Primary Diagnosis  Pulmonary   Date Notified  07/20/15   Date of Admission  07/20/15   Date first seen by Palliative Care  07/21/15   # of days Palliative referral response time  1 Day(s)   # of days IP prior to Palliative referral  0   Clinical Assessment    Palliative Performance Scale Score  30%   Pain Max last 24 hours  0   Pain Min Last 24 hours  0   Dyspnea Max Last 24 Hours  9   Dyspnea Min Last 24 hours  5   Nausea Max Last 24 Hours  0   Nausea Min Last 24 Hours  0   Anxiety Max Last 24 Hours  7   Anxiety Min Last 24 Hours  4   Psychosocial & Spiritual Assessment    Palliative Care Outcomes    Who was at the meeting?  husband, 2 daughters, 2 sisters   Palliative Care Outcomes  Improved non-pain symptom therapy, Clarified goals of care, Counseled regarding hospice, Changed to focus on comfort, Transitioned to hospice   Patient/Family wishes: Interventions discontinued/not started   Mechanical Ventilation, NIPPV, BiPAP, Trach, PEG, Hemodialysis, Transfusion,  Vasopressors, Transfer out of ICU, Antibiotics, Tube feedings/TPN   Palliative Care follow-up planned  Yes, Facility      Additional Data Reviewed:  CBC:    Component Value Date/Time   WBC 10.5 07/21/2015 0302   HGB 13.2 07/21/2015 0302   HCT 41.0 07/21/2015 0302   PLT 168 07/21/2015 0302   MCV 92.1 07/21/2015 0302   NEUTROABS 9.3* 07/20/2015 1120   LYMPHSABS 2.4 07/20/2015 1120   MONOABS 1.2* 07/20/2015 1120   EOSABS 0.5 07/20/2015 1120   BASOSABS 0.0 07/20/2015 1120   Comprehensive Metabolic Panel:    Component Value Date/Time   NA 141 07/21/2015 0302   K 3.4* 07/21/2015 0302   CL 92* 07/21/2015 0302   CO2 41* 07/21/2015 0302   BUN 10 07/21/2015 0302   CREATININE 0.52 07/21/2015 0302   GLUCOSE 99 07/21/2015 0302   CALCIUM 9.0 07/21/2015 0302   AST 28 04/30/2015 1713   ALT 20 04/30/2015 1713   ALKPHOS 69 04/30/2015 1713   BILITOT 0.5 04/30/2015 1713   PROT 6.8 04/30/2015 1713   ALBUMIN 3.9 04/30/2015 1713     Time In: 1100 Time Out: 1230 Time Total:  90 min Greater than 50%  of this time was spent counseling and coordinating care related to the above assessment and plan. Staffed with Dr. Rito Ehrlich  Signed by: Irean Hong, NP  Irean Hong, NP  07/21/2015, 1:50 PM  Please contact Palliative Medicine Team phone at [phone] for questions and concerns.

## 2015-07-21 NOTE — Progress Notes (Signed)
Utilization review completed. Briah Nary, RN, BSN. 

## 2015-07-21 NOTE — Discharge Summary (Signed)
[facility] PROGRESS NOTE  Tonya [name] WUJ:[id number] DOB: 20-Jul-1946 DOA: 07/20/2015  PCP:  Duane Lopeoss, Alan, MD  Brief HPI: 69 year old Caucasian female with past medical history of bronchiectasis, chronic respiratory failure on 4 L of oxygen at home, presented with worsening shortness of breath over the last 1 week. She had failed outpatient treatment with antibiotics and was hospitalized for further management.  Past medical history:  Past Medical History  Diagnosis Date  . Bronchiectasis   . Hypertension   . GERD (gastroesophageal reflux disease)   . On home oxygen therapy     "4L; 24/7" (07/20/2015)  . Anxiety     Consultants: Pulmonology  Procedures: None  Antibiotics: Meropenem IV Inhaled tobramycin  Subjective: Patient got out of bed this morning to go to the bathroom, which made her extremely short of breath. Her daughter is at the bedside. She denies any chest pain.  Objective: Vital Signs  Filed Vitals:   07/21/15 0600 07/21/15 0700 07/21/15 0834 07/21/15 1146  BP: 114/53 134/60    Pulse: 56 56    Temp:  98.1 F (36.7 C)    TempSrc:  Oral    Resp: 17 18    Weight:    44.906 kg (99 lb)  SpO2: 99% 99% 95%     Intake/Output Summary (Last 24 hours) at 07/21/15 1204 Last data filed at 07/21/15 0900  Gross per 24 hour  Intake    460 ml  Output    850 ml  Net   -390 ml   Filed Weights   07/21/15 1146  Weight: 44.906 kg (99 lb)    General appearance: alert, cooperative, appears stated age and no distress Resp: Using accessory muscles. Diminished air entry. Coarse breath sounds with rhonchi bilateral bases. Few scattered wheezes. Cardio: regular rate and rhythm, S1, S2 normal, no murmur, click, rub or gallop GI: soft, non-tender; bowel sounds normal; no masses,  no organomegaly Extremities: extremities normal, atraumatic, no cyanosis or edema Neurologic: Alert and oriented 3. No focal neurological deficits.  Lab Results:  Basic  Metabolic Panel:  Recent Labs Lab 07/20/15 1120 07/21/15 0302  NA 141 141  K 3.5 3.4*  CL 93* 92*  CO2 39* 41*  GLUCOSE 128* 99  BUN 8 10  CREATININE 0.68 0.52  CALCIUM 9.5 9.0   CBC:  Recent Labs Lab 07/20/15 1120 07/20/15 1918 07/21/15 0302  WBC 13.4* 9.2 10.5  NEUTROABS 9.3*  --   --   HGB 14.0 13.5 13.2  HCT 44.6 42.6 41.0  MCV 93.7 92.6 92.1  PLT 165 163 168   BNP (last 3 results)  Recent Labs  07/20/15 1120  BNP 17.7    CBG: No results for input(s): GLUCAP in the last 168 hours.  Recent Results (from the past 240 hour(s))  MRSA PCR Screening     Status: None   Collection Time: 07/20/15  5:33 PM  Result Value Ref Range Status   MRSA by PCR NEGATIVE NEGATIVE Final    Comment:        The GeneXpert MRSA Assay (FDA approved for NASAL specimens only), is one component of a comprehensive MRSA colonization surveillance program. It is not intended to diagnose MRSA infection nor to guide or monitor treatment for MRSA infections.       Studies/Results: Ct Angio Chest Pe W/cm &/or Wo Cm  07/20/2015  CLINICAL DATA:  Chronic shortness of breath, worsening over the past several months. History of bronchiectasis and hypertension. Evaluate for pulmonary embolism.  EXAM: CT ANGIOGRAPHY CHEST WITH CONTRAST TECHNIQUE: Multidetector CT imaging of the chest was performed using the standard protocol during bolus administration of intravenous contrast. Multiplanar CT image reconstructions and MIPs were obtained to evaluate the vascular anatomy. CONTRAST:  50 cc Omnipaque 350 COMPARISON:  Chest radiograph-earlier same day ; chest CT- 07/22/2014 FINDINGS: Vascular Findings: There is suboptimal opacification of the pulmonary arterial system with the main pulmonary artery measuring only 153 Hounsfield units. Given this limitation, there are no discrete filling defects within the pulmonary arterial tree to the level of the bilateral subsegmental pulmonary arteries. Evaluation of  the distal subsegmental pulmonary arteries is degraded secondary to suboptimal vessel opacification. Normal caliber the main pulmonary artery. Coronary artery calcifications.  No pericardial effusion. Normal caliber of the thoracic aorta. Conventional configuration aortic arch. The branch vessels of the aortic arch appear widely patent throughout their imaged course. ---------------------------------------------------------------------------------- Nonvascular Findings: Re- demonstrated diffuse bronchial wall thickening with extensive mixed cylindrical and varicose bronchiectasis, most conspicuous within the bilateral upper lobes (Right greater than left) and bilateral lower lobes, left greater than right. There are scattered perihilar predominant areas of ill-defined ground-glass compatible with air trapping. There is minimal subsegmental atelectasis within the bilateral costophrenic angles inferior segment of the lingula. No discrete focal airspace opacities. No air bronchograms. No pleural effusion or pneumothorax. Minimal symmetric biapical pleural parenchymal thickening. No discrete pulmonary nodules. No mediastinal, hilar axillary lymphadenopathy. Limited early arterial phase evaluation of the upper abdomen is normal. No acute or aggressive osseous abnormalities. Mild to moderate multilevel DDD throughout the mid in caudal aspects of the thoracic spine. Regional soft tissues appear normal. Normal appearance of the thyroid gland. A minimal amount of debris is noted within the cervical segment of the esophagus. Review of the MIP images confirms the above findings. IMPRESSION: 1. No acute cardiopulmonary disease. Specifically, no evidence of pulmonary embolism to the level of the bilateral subsegmental pulmonary arteries. 2. Re- demonstrated diffuse bronchial wall thickening with extensive mixed cylindrical and varicose bronchiectasis - again suggestive of chronic ABPA. Given bronchial wall thickening,  superimposed acute bronchiectasis could result in a similar appearance. No discrete focal airspace opacities to suggest pneumonia. 3. Scattered perihilar predominant areas of ill-defined ground-glass compatible with air trapping. Electronically Signed   By: Simonne Come M.D.   On: 07/20/2015 16:02   Dg Chest Port 1 View  07/20/2015  CLINICAL DATA:  Several month history of shortness of breath with increased symptoms today; history of COPD, chronic bronchiectasis common nonsmoker. EXAM: PORTABLE CHEST 1 VIEW COMPARISON:  PA and lateral chest x-ray of June 27, 2015 FINDINGS: The lungs remain hyperinflated. There is no focal infiltrate. The interstitial markings are coarse especially in the right infrahilar region. This is stable. There is no alveolar infiltrate, pleural effusion, or pneumothorax. The heart and pulmonary vascularity are normal. The mediastinum is normal in width. The bony thorax is unremarkable. IMPRESSION: COPD. Stable coarse lung markings in the infrahilar regions especially on the right likely reflect known bronchiectasis. There is no evidence of pneumonia nor CHF. Electronically Signed   By: David  [place] M.D.   On: 07/20/2015 12:40    Medications:  Scheduled: . antiseptic oral rinse  7 mL Mouth Rinse BID  . aspirin EC  81 mg Oral QHS  . B-complex with vitamin C  1 tablet Oral Daily  . benzonatate  200 mg Oral TID  . enoxaparin (LOVENOX) injection  30 mg Subcutaneous Q24H  . famotidine  20 mg Oral QHS  . feeding  supplement  1 Container Oral TID BM  . fluticasone  1 spray Each Nare Daily  . ipratropium  0.5 mg Nebulization TID  . levalbuterol  0.63 mg Nebulization TID  . loratadine  10 mg Oral Daily  . meropenem (MERREM) IV  1 g Intravenous Q12H  . methylPREDNISolone (SOLU-MEDROL) injection  60 mg Intravenous Q6H  . multivitamin with minerals  1 tablet Oral QHS  . pantoprazole  40 mg Oral QAC breakfast  . sodium chloride  3 mL Intravenous Q12H  . tobramycin (PF)  300 mg  Nebulization BID   Continuous:  ONG:EXBMWU chloride, acetaminophen **OR** acetaminophen, clorazepate, guaiFENesin, HYDROcodone-acetaminophen, levalbuterol, morphine injection, ondansetron **OR** ondansetron (ZOFRAN) IV, sodium chloride, sodium chloride  Assessment/Plan:  Principal Problem:   Acute on chronic respiratory failure (HCC) Active Problems:   Hypertension   Bronchiectasis without acute exacerbation (HCC)   Respiratory failure (HCC)    Acute exacerbation of bronchiectasis Appreciate pulmonology input. Continue meropenem and inhaled tobramycin. Cultures are pending.  Acute on chronic respiratory failure with hypoxia Patient appears to be stable on her nasal cannula oxygen. She did not require BiPAP overnight. Continue nebulizer treatments.  COPD with acute exacerbation Continue steroids and antibiotics as outlined above. Continue nebulizer treatments. BiPAP PRN  History of essential hypertension Blood pressure stable. Continue to monitor closely. Her diuretics are on hold  Hypokalemia We will be repleted.  DVT Prophylaxis: Lovenox    Code Status: DO NOT RESUSCITATE  Family Communication: Discussed with the patient and her daughter  Disposition Plan: Await improvement. Patient also interested in speaking with palliative medicine. They have been consulted.    LOS: 1 day   [facility]  [facility] Pager [phone] 07/21/2015, 12:04 PM  If 7PM-7AM, please contact night-coverage at [email or web address], password [facility]

## 2015-07-21 NOTE — Progress Notes (Signed)
Initial Nutrition Assessment  DOCUMENTATION CODES:   Non-severe (moderate) malnutrition in context of chronic illness  INTERVENTION:   Boost Breeze po TID, each supplement provides 250 kcal and 9 grams of protein  NUTRITION DIAGNOSIS:   Inadequate oral intake related to poor appetite as evidenced by per patient/family report  GOAL:   Patient will meet greater than or equal to 90% of their needs   MONITOR:   PO intake, Supplement acceptance, Labs, Weight trends, I & O's  REASON FOR ASSESSMENT:   Malnutrition Screening Tool  ASSESSMENT:   69 y.o. Female with PMH significant for bronchiectasis, chronic respiratory failure on 4 L of oxygen who presents complaining of worsening SOB over last week. She saw her primary pulmonologist two days prior to admission. She was prescribe antibiotics, and received a shot of medrol. She report worsening chest tightness and dyspnea this morning. She report productive cough. She denies fevers, does report chills. She denies abdominal pain, diarrhea.    Pt seen per RD during September 2016 hospitalization.  This RD spoke with patient's family at bedside.  Report pt is not eating well.  PO intake 5% per flowsheet records.  Typically consumes cereal with fruit at breakfast, 1/2 sandwich with fruit at lunch and dinner is a meat, starch, and vegetable.  Has Boost Breeze oral nutrition supplements ordered and is drinking.  Nutrition-Focused physical exam completed. Findings are mild to moderate fat depletion, mild to moderate muscle depletion, and no edema.   Diet Order:  Diet clear liquid Room service appropriate?: Yes; Fluid consistency:: Thin  Skin:  Reviewed, no issues  Last BM:  12/15  Height:   Ht Readings from Last 1 Encounters:  07/18/15 5\' 1"  (1.549 m)    Weight:   Wt Readings from Last 1 Encounters:  07/21/15 99 lb (44.906 kg)    Ideal Body Weight:  48 kg  BMI:  Body mass index is 18.72 kg/(m^2).  Estimated Nutritional  Needs:   Kcal:  1200-1400  Protein:  50-60 gm  Fluid:  >/= 1.5 L  EDUCATION NEEDS:   No education needs identified at this time  Tonya ChattersKatie Rihanna [name], RD, LDN Pager #: [phone] After-Hours Pager #: [phone]

## 2015-07-22 MED ORDER — ONDANSETRON HCL 4 MG/2ML IJ SOLN: 4.0000 mg | Freq: Four times a day (QID) | INTRAMUSCULAR | Status: DC | PRN

## 2015-07-22 MED ORDER — NALOXONE HCL 0.4 MG/ML IJ SOLN: 0.4000 mg | INTRAMUSCULAR | Status: DC | PRN

## 2015-07-22 MED ORDER — WHITE PETROLATUM GEL
Status: AC
Start: 2015-07-22 — End: 2015-07-22
  Administered 2015-07-22: 0.2
  Filled 2015-07-22: qty 1

## 2015-07-22 MED ORDER — DIPHENHYDRAMINE HCL 12.5 MG/5ML PO ELIX: 12.5000 mg | ORAL_SOLUTION | Freq: Four times a day (QID) | ORAL | Status: DC | PRN

## 2015-07-22 MED ORDER — MORPHINE SULFATE (PF) 2 MG/ML IV SOLN: 2.0000 mg | INTRAVENOUS | Status: AC | PRN

## 2015-07-22 MED ORDER — DEXAMETHASONE SODIUM PHOSPHATE 10 MG/ML IJ SOLN
INTRAMUSCULAR | Status: AC
  Filled 2015-07-22: qty 1

## 2015-07-22 MED ORDER — DIPHENHYDRAMINE HCL 50 MG/ML IJ SOLN: 12.5000 mg | Freq: Four times a day (QID) | INTRAMUSCULAR | Status: DC | PRN

## 2015-07-22 MED ORDER — SODIUM CHLORIDE 0.9 % IJ SOLN: 9.0000 mL | INTRAMUSCULAR | Status: DC | PRN

## 2015-07-22 MED ORDER — LORAZEPAM 2 MG/ML IJ SOLN: 0.5000 mg | INTRAMUSCULAR | Status: AC | PRN

## 2015-07-22 MED ORDER — DEXAMETHASONE SODIUM PHOSPHATE 4 MG/ML IJ SOLN: 8.0000 mg | Freq: Every morning | INTRAMUSCULAR | Status: AC

## 2015-07-22 MED ORDER — MORPHINE SULFATE 2 MG/ML IV SOLN: INTRAVENOUS | Status: DC

## 2015-07-22 NOTE — Clinical Social Work Note (Signed)
Clinical Social Work Assessment  Patient Details  Name: Tonya [name] MRN: [id number] Date of Birth: [birth year]  Date of referral:  07/22/15               Reason for consult:  End of Life/Hospice                Permission sought to share information with:  Facility Sport and exercise psychologist, Family Supports Permission granted to share information::  Yes, Verbal Permission Granted  Name::     Wendi  Agency::  [facility]  Relationship::  daughter  Contact Information:     Housing/Transportation Living arrangements for the past 2 months:  Single Family Home Source of Information:  Adult Children Patient Interpreter Needed:  None Criminal Activity/Legal Involvement Pertinent to Current Situation/Hospitalization:  No - Comment as needed Significant Relationships:  Adult Children Lives with:  Adult Children Do you feel safe going back to the place where you live?  Yes Need for family participation in patient care:  Yes (Comment)  Care giving concerns:  None, at this time.   Social Worker assessment / plan:  Met with Pt's family who stated that they have spoken with the Palliative Medicine PA.  They all are in agreement that [facility] facility is the best place for Pt.  The family agreed to allow SW to speak to [facility] to make the referral on their behalf.  Spoke with Antonieta Iba, RN, with [facility].  Mechele Claude stated that she will be by to meet with the family today at 9.  She will then notify SW about the next step.  Notified family that Mechele Claude will arrive at 1230.  Family to await Joanne's arrival.  No additional needs noted.  SW thanked family for their time.  Employment status:  Retired Nurse, adult PT Recommendations:  Not assessed at this time Information / Referral to community resources:     Patient/Family's Response to care:  Pt's family in agreement that Pt's best d/c plan is to [facility].  Patient/Family's  Understanding of and Emotional Response to Diagnosis, Current Treatment, and Prognosis:  Pt's family tearful but voiced they understand the situation and feel that [facility] is the best plan.  Emotional Assessment Appearance:  Other (Comment Required (did not assess) Attitude/Demeanor/Rapport:    Affect (typically observed):  Unable to Assess Orientation:   (did not assess) Alcohol / Substance use:  Never Used Psych involvement (Current and /or in the community):  No (Comment)  Discharge Needs  Concerns to be addressed:  No discharge needs identified Readmission within the last 30 days:  No Current discharge risk:  None Barriers to Discharge:  No Barriers Identified   Matilde Bash, [place] 07/22/2015, 11:31 AM

## 2015-07-22 NOTE — Consult Note (Signed)
[facility]--Met at bedside with pt and daughters:  Maudie Mercury and Myer Haff, and Siblings:  Efrain Sella and [place].  Discussed [facility] services.  Pt and family all feel that pt needs to transfer to [place] [facility] for intense symptom management of pt's dyspnea/anxiety and end-of-life care.  Reviewed philosophy of comfort care, multi-disciplinary team approach to care, 2 levels-of-care and financial subsidy.  DTR has signed requisite consent work per pt request.  DC planner=Amanda and staff RN=Ben are aware of d/c plan.  Will transfer to [facility] today.  Thank you for referral and opportunity to serve pt and family.  Wynetta Fines, RN

## 2015-07-22 NOTE — Progress Notes (Signed)
Daily Progress Note   Patient Name: Tonya [name]       Date: 07/22/2015 DOB: Mar 12, 1946  Age: 69 y.o. MRN#: [id number] Attending Physician: No att. providers found Primary Care Physician:  Duane Lope, MD Admit Date: 07/20/2015  Reason for Consultation/Follow-up: Establishing goals of care, Inpatient hospice referral, Non pain symptom management and Psychosocial/spiritual support  Subjective: Pt's breathing improved on scheduled doing but still quite dyspneic at rest. She reports feeling about " 59% better". Pt had a bath this am and now in respiratory distress. Discussed role of pca to help manage dyspnea. Pt and family agreeable Interval Events: Moved out of SD unit; started on scheduled MS04 Length of Stay: 2 days  Current Medications: Scheduled Meds:  . aspirin EC  81 mg Oral QHS  . dexamethasone  8 mg Intravenous q morning - 10a  . famotidine  20 mg Oral QHS  . feeding supplement  1 Container Oral TID BM  . fluticasone  1 spray Each Nare Daily  . ipratropium  0.5 mg Nebulization TID  . levalbuterol  0.63 mg Nebulization TID  . loratadine  10 mg Oral Daily  .  morphine injection  2 mg Intravenous 6 times per day  . pantoprazole  40 mg Oral QAC breakfast  . sodium chloride  3 mL Intravenous Q12H  . tobramycin (PF)  300 mg Nebulization BID    Continuous Infusions:    PRN Meds: sodium chloride, acetaminophen **OR** acetaminophen, clorazepate, levalbuterol, LORazepam, morphine injection, [DISCONTINUED] ondansetron **OR** ondansetron (ZOFRAN) IV, sodium chloride, sodium chloride  Physical Exam: Physical Exam  Constitutional: She is oriented to person, place, and time.  Cachetic older female  Cardiovascular:  tachy  Pulmonary/Chest:  Increased work of breathing    Neurological: She is alert and oriented to person, place, and time.  Skin: Skin is warm and dry.  Psychiatric:  constricted                Vital Signs: BP 135/56 mmHg  Pulse 57  Temp(Src) 98 F (36.7 C) (Oral)  Resp 16  Ht  (1.549 m)  Wt 44.9 kg (98 lb 15.8 oz)  BMI 18.71 kg/m2  SpO2 97% SpO2: SpO2: 97 % O2 Device: O2 Device: Nasal Cannula O2 Flow Rate: O2 Flow Rate (L/min): 4 L/min  Intake/output summary:  Intake/Output Summary (Last 24 hours) at 07/22/15 1602 Last data filed at 07/22/15 1332  Gross per 24 hour  Intake    461 ml  Output    375 ml  Net     86 ml   LBM: Last BM Date: 07/21/15 Baseline Weight: Weight: 44.906 kg (99 lb) Most recent weight: Weight: 44.9 kg (98 lb 15.8 oz)       Palliative Assessment/Data: Flowsheet Rows        Most Recent Value   Intake Tab    Referral Department  Hospitalist   Unit at Time of Referral  Med/Surg Unit   Palliative Care Primary Diagnosis  Pulmonary   Date Notified  07/20/15   Date of Admission  07/20/15   Date first seen by Palliative Care  07/21/15   # of days Palliative referral response time  1 Day(s)   # of days IP prior to Palliative referral  0   Clinical Assessment    Palliative Performance Scale Score  40%   Pain Max last 24 hours  0   Pain Min Last 24 hours  0   Dyspnea Max Last 24 Hours  6   Dyspnea Min Last 24 hours  3   Nausea Max Last 24 Hours  0   Nausea Min Last 24 Hours  0   Anxiety Max Last 24 Hours  3   Anxiety Min Last 24 Hours  1   Psychosocial & Spiritual Assessment    Palliative Care Outcomes    Who was at the meeting?  husband, 2 daughters, 2 sisters   Palliative Care Outcomes  Improved non-pain symptom therapy, Clarified goals of care, Counseled regarding hospice, Changed to focus on comfort, Transitioned to hospice   Patient/Family wishes: Interventions discontinued/not started   Mechanical Ventilation, NIPPV, BiPAP, Trach, PEG, Hemodialysis, Transfusion, Vasopressors, Transfer out  of ICU, Antibiotics, Tube feedings/TPN   Palliative Care follow-up planned  Yes, Facility      Additional Data Reviewed: CBC    Component Value Date/Time   WBC 10.5 07/21/2015 0302   RBC 4.45 07/21/2015 0302   HGB 13.2 07/21/2015 0302   HCT 41.0 07/21/2015 0302   PLT 168 07/21/2015 0302   MCV 92.1 07/21/2015 0302   MCH 29.7 07/21/2015 0302   MCHC 32.2 07/21/2015 0302   RDW 13.0 07/21/2015 0302   LYMPHSABS 2.4 07/20/2015 1120   MONOABS 1.2* 07/20/2015 1120   EOSABS 0.5 07/20/2015 1120   BASOSABS 0.0 07/20/2015 1120    CMP     Component Value Date/Time   NA 141 07/21/2015 0302   K 3.4* 07/21/2015 0302   CL 92* 07/21/2015 0302   CO2 41* 07/21/2015 0302   GLUCOSE 99 07/21/2015 0302   BUN 10 07/21/2015 0302   CREATININE 0.52 07/21/2015 0302   CALCIUM 9.0 07/21/2015 0302   PROT 6.8 04/30/2015 1713   ALBUMIN 3.9 04/30/2015 1713   AST 28 04/30/2015 1713   ALT 20 04/30/2015 1713   ALKPHOS 69 04/30/2015 1713   BILITOT 0.5 04/30/2015 1713   GFRNONAA >60 07/21/2015 0302   GFRAA >60 07/21/2015 0302       Problem List:  Patient Active Problem List   Diagnosis Date Noted  . Palliative care encounter   . Respiratory failure (HCC) 07/20/2015  . Acute on chronic respiratory failure (HCC) 07/20/2015  . Bronchiectasis without acute exacerbation (HCC) 06/27/2015  . Malnutrition of moderate degree (HCC) 05/01/2015  . COPD exacerbation (HCC) 04/30/2015  . Hypertension 04/30/2015  . Dehydration  04/30/2015  . Anxiety 04/30/2015  . Upper airway cough syndrome 04/14/2014  . Chronic respiratory failure with hypercapnia (HCC) 10/30/2013  . HBP (high blood pressure) 10/30/2013  . Dyspnea 10/29/2013  . Chronic rhinitis 08/21/2011  . GERD 05/08/2009     Palliative Care Assessment & Plan    1.Code Status:  DNR    Code Status Orders        Start     Ordered   07/20/15 1529  Do not attempt resuscitation (DNR)   Continuous    Question Answer Comment  In the event of  cardiac or respiratory ARREST Do not call a "code blue"   In the event of cardiac or respiratory ARREST Do not perform Intubation, CPR, defibrillation or ACLS   In the event of cardiac or respiratory ARREST Use medication by any route, position, wound care, and other measures to relive pain and suffering. May use oxygen, suction and manual treatment of airway obstruction as needed for comfort.      07/20/15 1528       2. Goals of Care/Additional Recommendations:  Comfort Care  Start MS04 PCA /hr and  q30 min prn  FU on transfer to [facility]  Limitations on Scope of Treatment: Full Comfort Care  Desire for further Chaplaincy support:no  Psycho-social Needs: Grief/Bereavement Support  3. Symptom Management:      1.Dyspnea: Start MS04 PCA /hr and  q prn       2. Anxiety: cont Tranxene and ativan prn  4. Palliative Prophylaxis:   Aspiration, Bowel Regimen, Frequent Pain Assessment, Oral Care and Turn Reposition  5. Prognosis: < 2 weeks  6. Discharge Planning:  Hospice facility     Thank you for allowing the Palliative Medicine Team to assist in the care of this patient.   Time In: 1000 Time Out: 1030 Total Time 30 min Prolonged Time Billed  no         Irean Hong, NP  07/22/2015, 4:02 PM  Please contact Palliative Medicine Team phone at [phone] for questions and concerns.

## 2015-07-22 NOTE — Progress Notes (Signed)
 2mg  iv morphine administered as scheduled before pt was transferred to hospice house. Report called and given to receiving facility nurse

## 2015-07-22 NOTE — Progress Notes (Signed)
 2mg  iv morphine administered as requested by family for pain/comfort. Hospice nurse working on transferring pt to hospice house this pm

## 2015-07-22 NOTE — Discharge Summary (Signed)
[facility] PROGRESS NOTE  Tonya [name] GNF:[id number] DOB: Sep 04, 1945 DOA: 07/20/2015  PCP:  Duane Lope, MD  Brief HPI: 69 year old Caucasian female with past medical history of bronchiectasis, chronic respiratory failure on 4 L of oxygen at home, presented with worsening shortness of breath over the last 1 week. She had failed outpatient treatment with antibiotics and was hospitalized for further management.  Past medical history:  Past Medical History  Diagnosis Date  . Bronchiectasis   . Hypertension   . GERD (gastroesophageal reflux disease)   . On home oxygen therapy     "4L; 24/7" (07/20/2015)  . Anxiety     Consultants: Pulmonology, palliative medicine  Procedures: None  Antibiotics: Meropenem IV stopped on December 16 Inhaled tobramycin  Subjective: Patient feels more comfortable this morning. Still gets short of breath with minimal exertion. Feels anxious. Daughter is at the bedside.   Objective: Vital Signs  Filed Vitals:   07/21/15 2246 07/22/15 0400 07/22/15 0438 07/22/15 0500  BP: 142/71 145/56  135/56  Pulse:  73  57  Temp:  98.3 F (36.8 C)  98 F (36.7 C)  TempSrc:    Oral  Resp:  20  16  Height:      Weight:      SpO2:   94% 97%    Intake/Output Summary (Last 24 hours) at 07/22/15 8469 Last data filed at 07/22/15 6295  Gross per 24 hour  Intake    483 ml  Output    225 ml  Net    258 ml   Filed Weights   07/21/15 1146 07/21/15 1600  Weight: 44.906 kg (99 lb) 44.9 kg (98 lb 15.8 oz)    General appearance: alert, cooperative, appears stated age and no distress Resp: Seems better compared to yesterday. Continues to have diminished air entry bilaterally with rhonchi bilaterally. Scattered wheezes.  Cardio: regular rate and rhythm, S1, S2 normal, no murmur, click, rub or gallop GI: soft, non-tender; bowel sounds normal; no masses,  no organomegaly Neurologic: Alert and oriented 3. No focal neurological deficits.  Lab  Results:  Basic Metabolic Panel:  Recent Labs Lab 07/20/15 1120 07/21/15 0302  NA 141 141  K 3.5 3.4*  CL 93* 92*  CO2 39* 41*  GLUCOSE 128* 99  BUN 8 10  CREATININE 0.68 0.52  CALCIUM 9.5 9.0   CBC:  Recent Labs Lab 07/20/15 1120 07/20/15 1918 07/21/15 0302  WBC 13.4* 9.2 10.5  NEUTROABS 9.3*  --   --   HGB 14.0 13.5 13.2  HCT 44.6 42.6 41.0  MCV 93.7 92.6 92.1  PLT 165 163 168   BNP (last 3 results)  Recent Labs  07/20/15 1120  BNP 17.7    CBG: No results for input(s): GLUCAP in the last 168 hours.  Recent Results (from the past 240 hour(s))  MRSA PCR Screening     Status: None   Collection Time: 07/20/15  5:33 PM  Result Value Ref Range Status   MRSA by PCR NEGATIVE NEGATIVE Final    Comment:        The GeneXpert MRSA Assay (FDA approved for NASAL specimens only), is one component of a comprehensive MRSA colonization surveillance program. It is not intended to diagnose MRSA infection nor to guide or monitor treatment for MRSA infections.       Studies/Results: Ct Angio Chest Pe W/cm &/or Wo Cm  07/20/2015  CLINICAL DATA:  Chronic shortness of breath, worsening over the past several months. History of  bronchiectasis and hypertension. Evaluate for pulmonary embolism. EXAM: CT ANGIOGRAPHY CHEST WITH CONTRAST TECHNIQUE: Multidetector CT imaging of the chest was performed using the standard protocol during bolus administration of intravenous contrast. Multiplanar CT image reconstructions and MIPs were obtained to evaluate the vascular anatomy. CONTRAST:  50 cc Omnipaque 350 COMPARISON:  Chest radiograph-earlier same day ; chest CT- 07/22/2014 FINDINGS: Vascular Findings: There is suboptimal opacification of the pulmonary arterial system with the main pulmonary artery measuring only 153 Hounsfield units. Given this limitation, there are no discrete filling defects within the pulmonary arterial tree to the level of the bilateral subsegmental pulmonary  arteries. Evaluation of the distal subsegmental pulmonary arteries is degraded secondary to suboptimal vessel opacification. Normal caliber the main pulmonary artery. Coronary artery calcifications.  No pericardial effusion. Normal caliber of the thoracic aorta. Conventional configuration aortic arch. The branch vessels of the aortic arch appear widely patent throughout their imaged course. ---------------------------------------------------------------------------------- Nonvascular Findings: Re- demonstrated diffuse bronchial wall thickening with extensive mixed cylindrical and varicose bronchiectasis, most conspicuous within the bilateral upper lobes (Right greater than left) and bilateral lower lobes, left greater than right. There are scattered perihilar predominant areas of ill-defined ground-glass compatible with air trapping. There is minimal subsegmental atelectasis within the bilateral costophrenic angles inferior segment of the lingula. No discrete focal airspace opacities. No air bronchograms. No pleural effusion or pneumothorax. Minimal symmetric biapical pleural parenchymal thickening. No discrete pulmonary nodules. No mediastinal, hilar axillary lymphadenopathy. Limited early arterial phase evaluation of the upper abdomen is normal. No acute or aggressive osseous abnormalities. Mild to moderate multilevel DDD throughout the mid in caudal aspects of the thoracic spine. Regional soft tissues appear normal. Normal appearance of the thyroid gland. A minimal amount of debris is noted within the cervical segment of the esophagus. Review of the MIP images confirms the above findings. IMPRESSION: 1. No acute cardiopulmonary disease. Specifically, no evidence of pulmonary embolism to the level of the bilateral subsegmental pulmonary arteries. 2. Re- demonstrated diffuse bronchial wall thickening with extensive mixed cylindrical and varicose bronchiectasis - again suggestive of chronic ABPA. Given bronchial  wall thickening, superimposed acute bronchiectasis could result in a similar appearance. No discrete focal airspace opacities to suggest pneumonia. 3. Scattered perihilar predominant areas of ill-defined ground-glass compatible with air trapping. Electronically Signed   By: Simonne Come M.D.   On: 07/20/2015 16:02   Dg Chest Port 1 View  07/20/2015  CLINICAL DATA:  Several month history of shortness of breath with increased symptoms today; history of COPD, chronic bronchiectasis common nonsmoker. EXAM: PORTABLE CHEST 1 VIEW COMPARISON:  PA and lateral chest x-ray of June 27, 2015 FINDINGS: The lungs remain hyperinflated. There is no focal infiltrate. The interstitial markings are coarse especially in the right infrahilar region. This is stable. There is no alveolar infiltrate, pleural effusion, or pneumothorax. The heart and pulmonary vascularity are normal. The mediastinum is normal in width. The bony thorax is unremarkable. IMPRESSION: COPD. Stable coarse lung markings in the infrahilar regions especially on the right likely reflect known bronchiectasis. There is no evidence of pneumonia nor CHF. Electronically Signed   By: David  [place] M.D.   On: 07/20/2015 12:40    Medications:  Scheduled: . aspirin EC  81 mg Oral QHS  . dexamethasone  8 mg Intravenous q morning - 10a  . famotidine  20 mg Oral QHS  . feeding supplement  1 Container Oral TID BM  . fluticasone  1 spray Each Nare Daily  . ipratropium  0.5 mg  Nebulization TID  . levalbuterol  0.63 mg Nebulization TID  . loratadine  10 mg Oral Daily  .  morphine injection  2 mg Intravenous 6 times per day  . pantoprazole  40 mg Oral QAC breakfast  . sodium chloride  3 mL Intravenous Q12H  . tobramycin (PF)  300 mg Nebulization BID   Continuous:  ZOX:[id number]:sodium chloride, acetaminophen **OR** acetaminophen, clorazepate, levalbuterol, LORazepam, morphine injection, ondansetron **OR** ondansetron (ZOFRAN) IV, sodium chloride, sodium  chloride  Assessment/Plan:  Principal Problem:   Acute on chronic respiratory failure (HCC) Active Problems:   Hypertension   Bronchiectasis without acute exacerbation (HCC)   Respiratory failure (HCC)   Palliative care encounter    Acute exacerbation of bronchiectasis Patient was seen by pulmonology. She was initially started on meropenem. Patient has been declining over the past few months due to her lung disease. She feels tired and exhausted. Palliative medicine was consulted. Patient has elected comfort care. Hospice is being pursued. No active treatment for now.   Acute on chronic respiratory failure with hypoxia Patient appears to be stable on her nasal cannula oxygen. Continue nebulizer treatments. Comfort care is mainstay.  COPD with acute exacerbation Continue steroids. Continue nebulizer treatments.   History of essential hypertension Blood pressure stable. Continue to monitor closely. Her diuretics are on hold  Hypokalemia This was repleted  DVT Prophylaxis: Lovenox    Code Status: DO NOT RESUSCITATE  Family Communication: Discussed with the patient and her daughter  Disposition Plan: Patient is now comfort care with plans to transition to residential hospice.     LOS: 2 days   [facility] Health CypressKRISHNAN,Dowell Hoon  [facility] Pager [phone] 07/22/2015, 9:22 AM  If 7PM-7AM, please contact night-coverage at [email or web address], password [email or web address]

## 2015-07-22 NOTE — Progress Notes (Signed)
Pt has been assessed by palliative care.  DNR with plan for hospice.  PCCM will sign off.  Coralyn HellingVineet Ramona Slinger, MD [facility] 07/22/2015, 9:53 AM Pager:  [phone] After 3pm call: [phone]

## 2015-07-22 NOTE — Progress Notes (Signed)
Family met with [facility] and in agreement to transfer.  Pt in agreement, as well.  Per MD, Pt ready for d/c.  [facility] ready to accept the Pt.  SW arranged for ambulance transport.  Pt to be d/c'd.  Bernita Raisin, [place] Social Work [phone]

## 2015-07-22 NOTE — Discharge Summary (Signed)
[facility]  Physician Discharge Summary   Patient ID: Tonya [name] MRN: [id number] DOB/AGE: Apr 14, 1946 69 y.o.  Admit date: 07/20/2015 Discharge date: 07/22/2015  PCP:  Duane Lope, MD  DISCHARGE DIAGNOSES:  Principal Problem:   Acute on chronic respiratory failure (HCC) Active Problems:   Hypertension   Bronchiectasis without acute exacerbation (HCC)   Respiratory failure (HCC)   Palliative care encounter   RECOMMENDATIONS FOR OUTPATIENT FOLLOW UP: 1. Patient being discharged to residential hospice at [facility].   DISCHARGE CONDITION: fair  Diet recommendation: Regular  Filed Weights   07/21/15 1146 07/21/15 1600  Weight: 44.906 kg (99 lb) 44.9 kg (98 lb 15.8 oz)    INITIAL HISTORY: 69 year old Caucasian female with past medical history of bronchiectasis, chronic respiratory failure on 4 L of oxygen at home, presented with worsening shortness of breath over the last 1 week. She had failed outpatient treatment with antibiotics and was hospitalized for further management.  Consultants: Pulmonology, palliative medicine  Procedures: None  HOSPITAL COURSE:   Acute exacerbation of bronchiectasis Patient was seen by pulmonology. She was initially started on meropenem. Patient has been declining over the past few months due to her lung disease. She feels tired and exhausted. Palliative medicine was consulted. Patient has elected comfort care. Hospice was pursued. She will be discharged to residential hospice [place].   Acute on chronic respiratory failure with hypoxia Patient appears to be stable on her nasal cannula oxygen. Continue nebulizer treatments. Comfort care is mainstay.  COPD with acute exacerbation Continue steroids. Continue nebulizer treatments.   History of essential hypertension Blood pressure stable. Continue to monitor closely. Her diuretics are on hold  Hypokalemia This was repleted  Patient stable for transport to  residential hospice.   PERTINENT LABS:  The results of significant diagnostics from this hospitalization (including imaging, microbiology, ancillary and laboratory) are listed below for reference.    Microbiology: Recent Results (from the past 240 hour(s))  MRSA PCR Screening     Status: None   Collection Time: 07/20/15  5:33 PM  Result Value Ref Range Status   MRSA by PCR NEGATIVE NEGATIVE Final    Comment:        The GeneXpert MRSA Assay (FDA approved for NASAL specimens only), is one component of a comprehensive MRSA colonization surveillance program. It is not intended to diagnose MRSA infection nor to guide or monitor treatment for MRSA infections.      Labs: Basic Metabolic Panel:  Recent Labs Lab 07/20/15 1120 07/21/15 0302  NA 141 141  K 3.5 3.4*  CL 93* 92*  CO2 39* 41*  GLUCOSE 128* 99  BUN 8 10  CREATININE 0.68 0.52  CALCIUM 9.5 9.0   CBC:  Recent Labs Lab 07/20/15 1120 07/20/15 1918 07/21/15 0302  WBC 13.4* 9.2 10.5  NEUTROABS 9.3*  --   --   HGB 14.0 13.5 13.2  HCT 44.6 42.6 41.0  MCV 93.7 92.6 92.1  PLT 165 163 168   BNP: BNP (last 3 results)  Recent Labs  07/20/15 1120  BNP 17.7    CBG: No results for input(s): GLUCAP in the last 168 hours.   IMAGING STUDIES Dg Chest 2 View  06/28/2015  CLINICAL DATA:  69 year old female with shortness breath, weakness, cough and congestion. Possible bronchiectasis flare. EXAM: CHEST  2 VIEW COMPARISON:  Chest x-ray 04/30/2015. FINDINGS: Mild diffuse peribronchial cuffing. No confluent consolidative airspace disease. No pleural effusions. No pneumothorax. No evidence of pulmonary edema. Heart size is  normal. Upper mediastinal contours are within normal limits. IMPRESSION: 1. Diffuse peribronchial cuffing suggestive of acute bronchitis. Areas of bronchiectasis previously demonstrated on prior chest CT 07/22/2014 are not readily identifiable on plain film examination. Electronically Signed   By:  Trudie Reed M.D.   On: 06/28/2015 08:33   Ct Angio Chest Pe W/cm &/or Wo Cm  07/20/2015  CLINICAL DATA:  Chronic shortness of breath, worsening over the past several months. History of bronchiectasis and hypertension. Evaluate for pulmonary embolism. EXAM: CT ANGIOGRAPHY CHEST WITH CONTRAST TECHNIQUE: Multidetector CT imaging of the chest was performed using the standard protocol during bolus administration of intravenous contrast. Multiplanar CT image reconstructions and MIPs were obtained to evaluate the vascular anatomy. CONTRAST:  50 cc Omnipaque 350 COMPARISON:  Chest radiograph-earlier same day ; chest CT- 07/22/2014 FINDINGS: Vascular Findings: There is suboptimal opacification of the pulmonary arterial system with the main pulmonary artery measuring only 153 Hounsfield units. Given this limitation, there are no discrete filling defects within the pulmonary arterial tree to the level of the bilateral subsegmental pulmonary arteries. Evaluation of the distal subsegmental pulmonary arteries is degraded secondary to suboptimal vessel opacification. Normal caliber the main pulmonary artery. Coronary artery calcifications.  No pericardial effusion. Normal caliber of the thoracic aorta. Conventional configuration aortic arch. The branch vessels of the aortic arch appear widely patent throughout their imaged course. ---------------------------------------------------------------------------------- Nonvascular Findings: Re- demonstrated diffuse bronchial wall thickening with extensive mixed cylindrical and varicose bronchiectasis, most conspicuous within the bilateral upper lobes (Right greater than left) and bilateral lower lobes, left greater than right. There are scattered perihilar predominant areas of ill-defined ground-glass compatible with air trapping. There is minimal subsegmental atelectasis within the bilateral costophrenic angles inferior segment of the lingula. No discrete focal airspace  opacities. No air bronchograms. No pleural effusion or pneumothorax. Minimal symmetric biapical pleural parenchymal thickening. No discrete pulmonary nodules. No mediastinal, hilar axillary lymphadenopathy. Limited early arterial phase evaluation of the upper abdomen is normal. No acute or aggressive osseous abnormalities. Mild to moderate multilevel DDD throughout the mid in caudal aspects of the thoracic spine. Regional soft tissues appear normal. Normal appearance of the thyroid gland. A minimal amount of debris is noted within the cervical segment of the esophagus. Review of the MIP images confirms the above findings. IMPRESSION: 1. No acute cardiopulmonary disease. Specifically, no evidence of pulmonary embolism to the level of the bilateral subsegmental pulmonary arteries. 2. Re- demonstrated diffuse bronchial wall thickening with extensive mixed cylindrical and varicose bronchiectasis - again suggestive of chronic ABPA. Given bronchial wall thickening, superimposed acute bronchiectasis could result in a similar appearance. No discrete focal airspace opacities to suggest pneumonia. 3. Scattered perihilar predominant areas of ill-defined ground-glass compatible with air trapping. Electronically Signed   By: Simonne Come M.D.   On: 07/20/2015 16:02   Dg Chest Port 1 View  07/20/2015  CLINICAL DATA:  Several month history of shortness of breath with increased symptoms today; history of COPD, chronic bronchiectasis common nonsmoker. EXAM: PORTABLE CHEST 1 VIEW COMPARISON:  PA and lateral chest x-ray of June 27, 2015 FINDINGS: The lungs remain hyperinflated. There is no focal infiltrate. The interstitial markings are coarse especially in the right infrahilar region. This is stable. There is no alveolar infiltrate, pleural effusion, or pneumothorax. The heart and pulmonary vascularity are normal. The mediastinum is normal in width. The bony thorax is unremarkable. IMPRESSION: COPD. Stable coarse lung markings  in the infrahilar regions especially on the right likely reflect known bronchiectasis. There is  no evidence of pneumonia nor CHF. Electronically Signed   By: David  [place] M.D.   On: 07/20/2015 12:40   DISPOSITION: Residential Hospice  Discharge Instructions    Diet general    Complete by:  As directed      Increase activity slowly    Complete by:  As directed            ALLERGIES:  Allergies  Allergen Reactions  . Alprazolam Other (See Comments)    REACTION: "I just feel funny"  . Metoclopramide Hcl Other (See Comments)    Either swelling or funny feeling  . Tramadol     REACTION: made pt "feel funny"  . Doxycycline Swelling and Rash     rash, redness in face, slight swelling in lips  . Levaquin [Levofloxacin] Swelling and Rash     redness in face, rash, slight swelling in lips  . Moxifloxacin Swelling and Rash    Slight swelling in lips  . Penicillins Rash    Has patient had a PCN reaction causing immediate rash, facial/tongue/throat swelling, SOB or lightheadedness with hypotension: Yes Has patient had a PCN reaction causing severe rash involving mucus membranes or skin necrosis: No Has patient had a PCN reaction that required hospitalization No Has patient had a PCN reaction occurring within the last 10 years: No If all of the above answers are "NO", then may proceed with Cephalosporin use.  . Sulfonamide Derivatives Swelling and Rash    Slight swelling in lips     Current Discharge Medication List    START taking these medications   Details  dexamethasone (DECADRON) 4 MG/ML injection Inject 2 mLs (8 mg total) into the vein every morning. Qty: 1 mL    LORazepam (ATIVAN) 2 MG/ML injection Inject 0.25-0.5 mLs (0.5-1 mg total) into the vein every 4 (four) hours as needed for anxiety or sedation. Qty: 1 mL, Refills: 0    morphine 2 MG/ML injection Inject 1-2 mLs (2-4 mg total) into the vein every 2 (two) hours as needed (dyspnea). Qty: 1 mL, Refills: 0        CONTINUE these medications which have NOT CHANGED   Details  albuterol (PROVENTIL) (2.5 MG/3ML) 0.083% nebulizer solution Take 3 mLs (2.5 mg total) by nebulization every 6 (six) hours as needed for wheezing or shortness of breath. DX j47.1, j96.12 Qty: 360 mL, Refills: 6    aspirin EC 81 MG tablet Take 81 mg by mouth at bedtime.    benzonatate (TESSALON) 200 MG capsule Take 200 mg by mouth 3 (three) times daily.    budesonide-formoterol (SYMBICORT) 160-4.5 MCG/ACT inhaler Take 2 puffs first thing in am and then another 2 puffs about 12 hours later. Qty: 1 Inhaler, Refills: 11    clorazepate (TRANXENE) 3.75 MG tablet Take 3.75 mg by mouth 2 (two) times daily as needed for anxiety.  Refills: 0    famotidine (PEPCID) 20 MG tablet Take 20 mg by mouth at bedtime.    fluticasone (FLONASE) 50 MCG/ACT nasal spray USE 1 TO 2 SPRAYS IN EACH NOSTRIL IN THE MORNING AND AT BEDTIME Qty: 16 g, Refills: 4    guaiFENesin (MUCINEX) 600 MG 12 hr tablet Take 1,200 mg by mouth 2 (two) times daily as needed for cough.     HYDROcodone-acetaminophen (NORCO/VICODIN) 5-325 MG tablet Take 1 tablet by mouth every 4 (four) hours as needed for moderate pain.    levalbuterol (XOPENEX HFA) 45 MCG/ACT inhaler Inhale 1-2 puffs into the lungs every 4 (four) hours  as needed for wheezing. Qty: 1 Inhaler, Refills: 12    OXYGEN Use 3L of O2 with activity as needed      STOP taking these medications     b complex vitamins tablet      Calcium Carb-Cholecalciferol (CALCIUM PLUS VITAMIN D3 PO)      cefUROXime (CEFTIN) 500 MG tablet      cetirizine (ZYRTEC) 10 MG tablet      losartan-hydrochlorothiazide (HYZAAR) 100-12.5 MG per tablet      Multiple Vitamin (MULTIVITAMIN WITH MINERALS) TABS tablet      oxymetazoline (AFRIN) 0.05 % nasal spray      pantoprazole (PROTONIX) 40 MG tablet      predniSONE (DELTASONE) 10 MG tablet      Respiratory Therapy Supplies (FLUTTER) DEVI      sodium chloride (OCEAN) 0.65 %  SOLN nasal spray      cefUROXime (CEFTIN) 250 MG tablet      saccharomyces boulardii (FLORASTOR) 250 MG capsule        Follow-up Information    Call  Duane Lopeoss, Alan, MD.   Specialty:  Family Medicine   Why:  As needed   Contact information:   [address] [place] [address] [phone]       TOTAL DISCHARGE TIME: 35 mins  [facility] HospitalKRISHNAN,Araminta Zorn  [facility] Pager [phone]  07/22/2015, 2:55 PM

## 2015-07-28 ENCOUNTER — Ambulatory Visit: Payer: Medicare Other | Admitting: Adult Health

## 2015-08-06 DEATH — deceased

## 2015-08-10 ENCOUNTER — Ambulatory Visit: Payer: Medicare Other | Admitting: Adult Health

## 2015-08-10 ENCOUNTER — Telehealth: Payer: Self-pay | Admitting: Adult Health

## 2015-08-10 NOTE — Telephone Encounter (Signed)
Spoke with Tonya [name]. Pt has a chest vest and she has no idea what to do with it. She was not able to find any paperwork on it either. Spoke with Almyra FreeLibby and she reports have her bring it to our office and she will get it to [facility] I called Tonya [name] back and made her aware. Nothing further needed

## 2015-10-02 ENCOUNTER — Telehealth: Payer: Self-pay | Admitting: Pulmonary Disease

## 2015-10-02 NOTE — Telephone Encounter (Signed)
Spoke with Wendi. Advised her that [place] clinical staff are unaware about where this vest.  Almyra Free - did you receive this vest to give to [facility]??

## 2015-10-02 NOTE — Telephone Encounter (Signed)
[phone], Liza from [facility] calling to come by to pick up afflovest, states they supplied the vest and need it back, please see previous phone message

## 2015-10-03 NOTE — Telephone Encounter (Signed)
Spoke to daughter will get the vest back to her she will call [facility] to see how they want to get it back Tobe Sos

## 2015-10-03 NOTE — Telephone Encounter (Signed)
Vest will be back in our office on wed 10/04/15 we will call [email or web address] and she will come and pick it up Tobe Sos

## 2016-06-08 IMAGING — CR DG CHEST 2V
2 series · 2 of 2 positions shown · non-contrast
Comparison: PA and lateral chest x-ray dated October 29, 2013.

CLINICAL DATA: Three months of shortness of breath and fatigue;
history of bronchiectasis and hypertension

EXAM:
CHEST  2 VIEW

[view not recorded (1 of 2)]
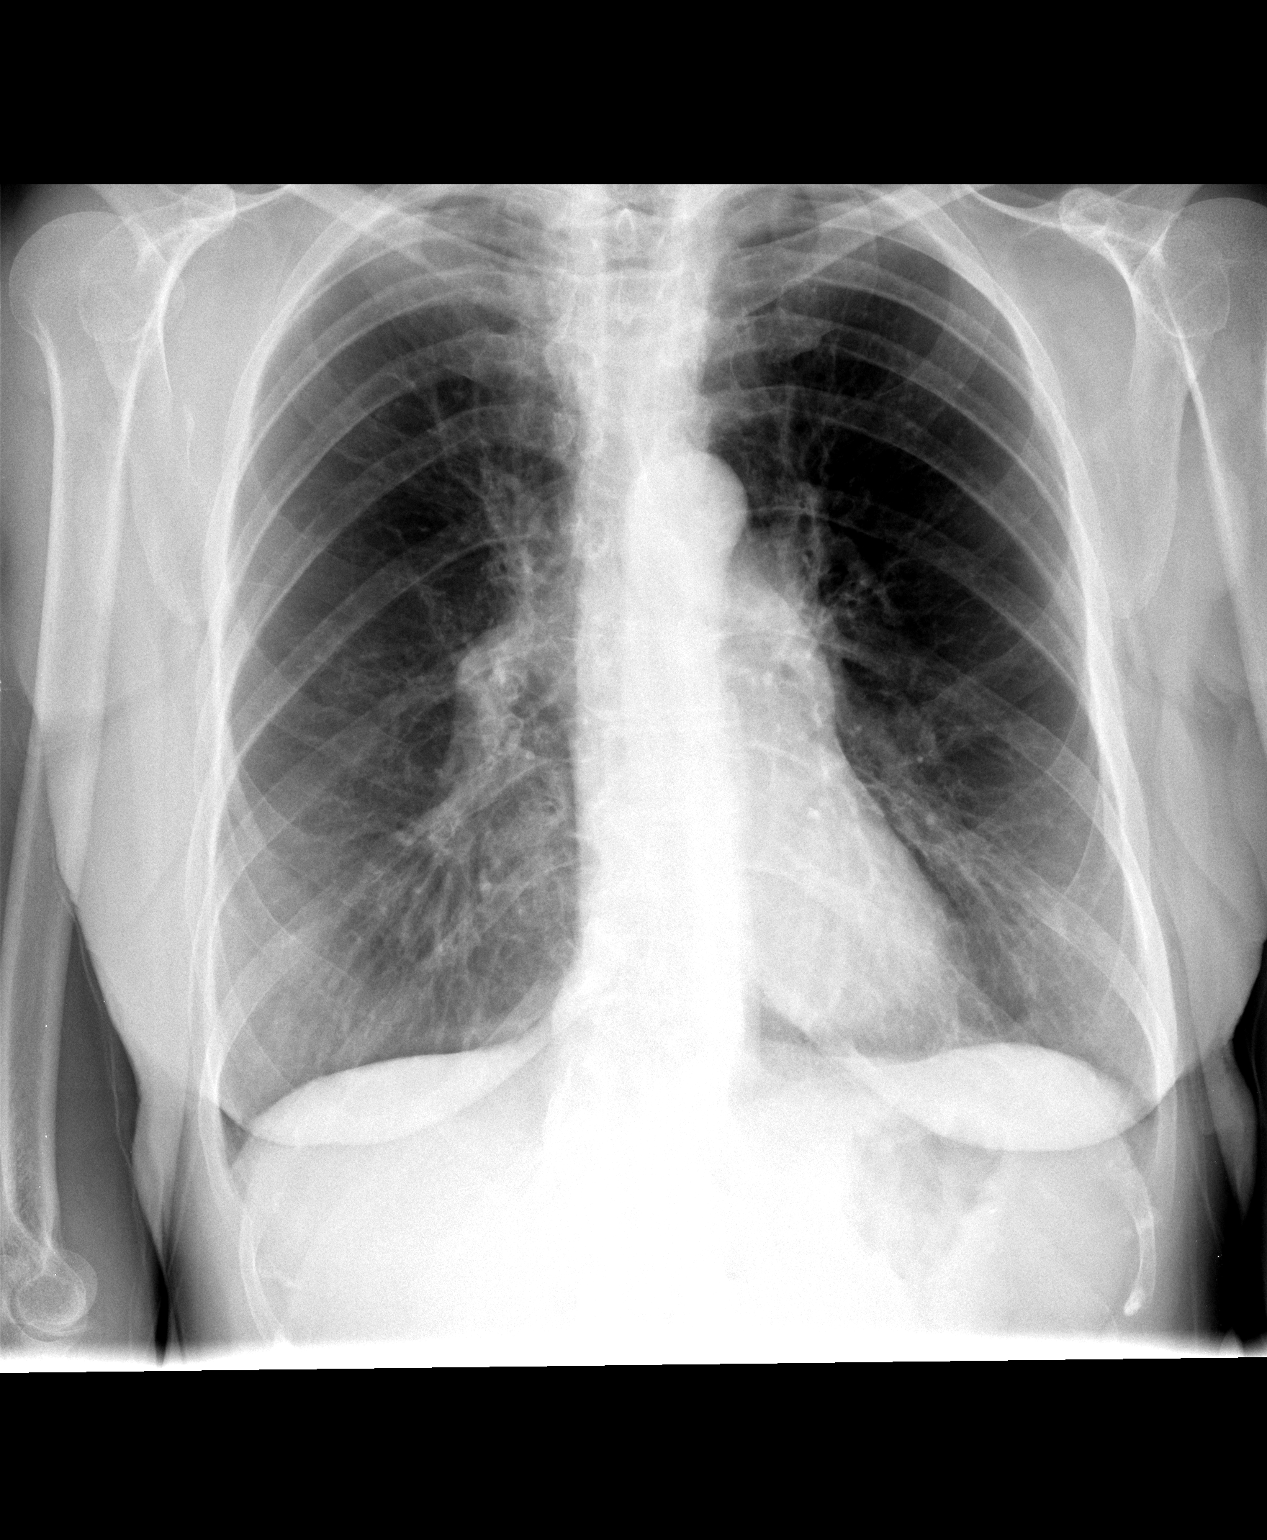

[view not recorded (2 of 2)]
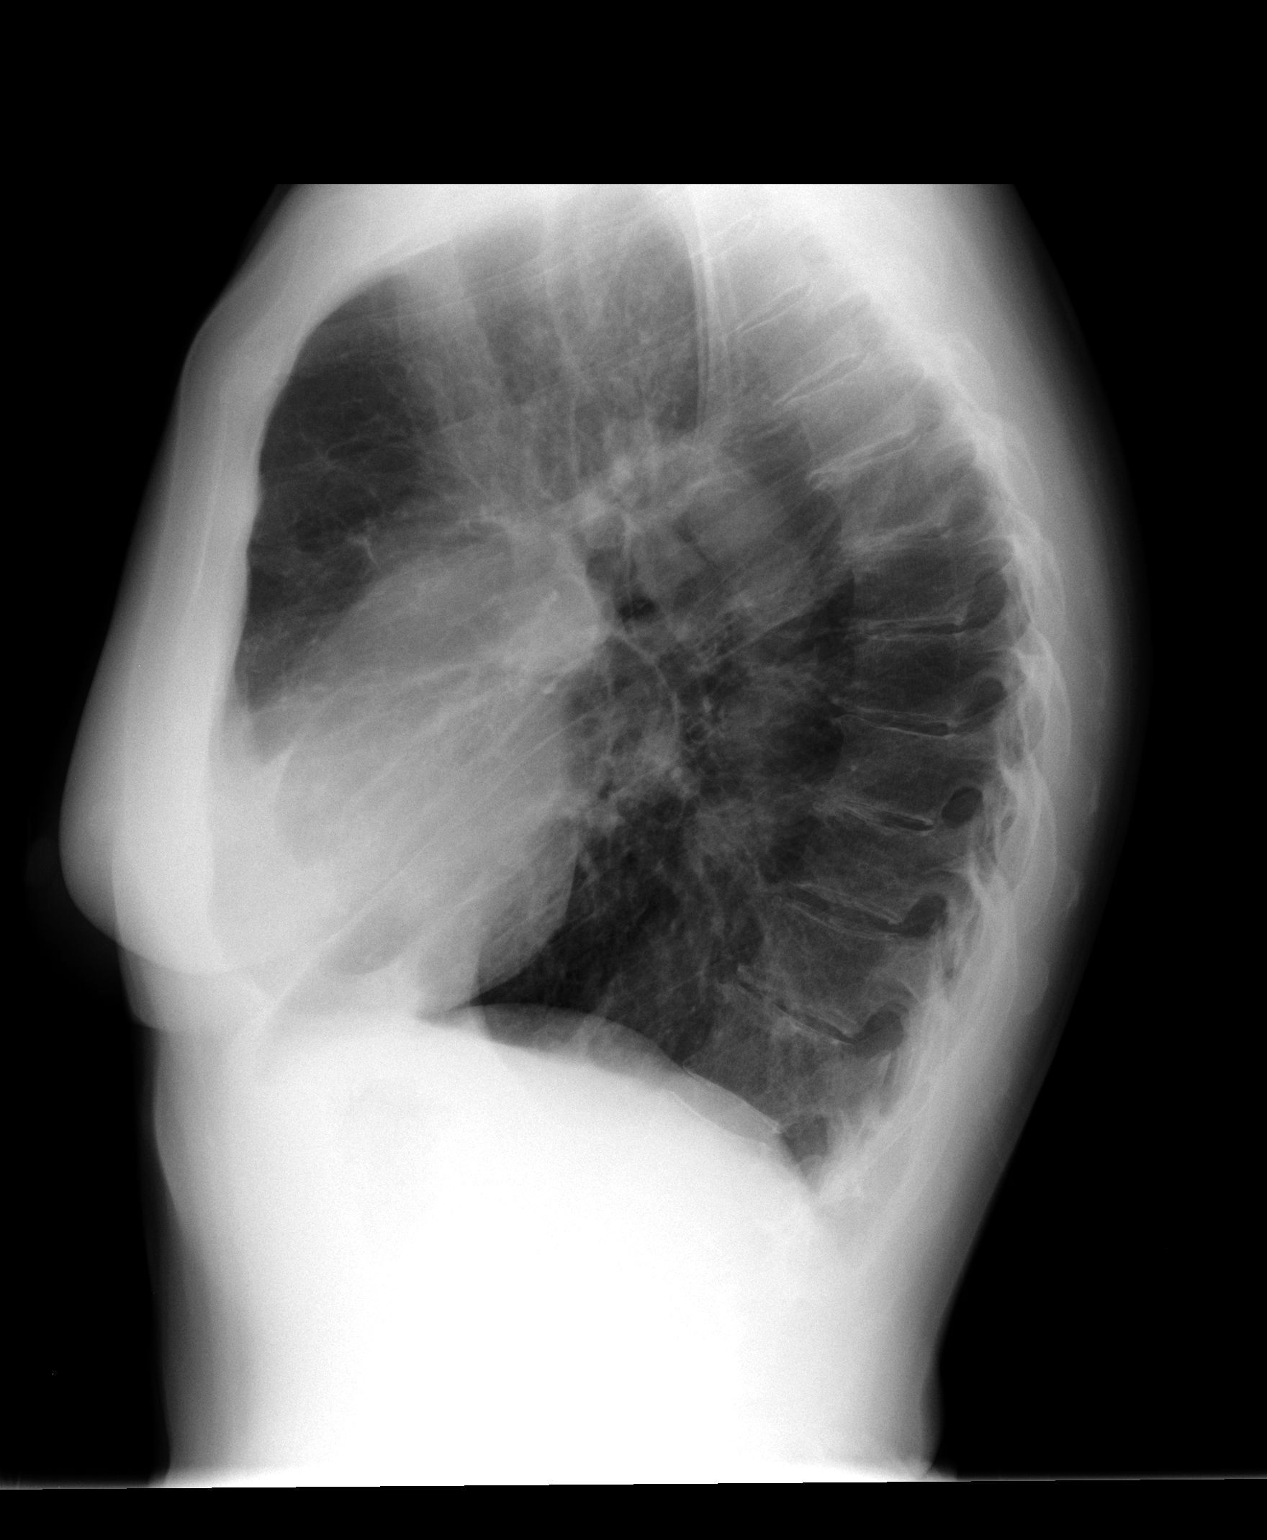

[2 of 2 positions shown; findings below may reference images not displayed]

FINDINGS: The lungs are hyperinflated with hemidiaphragm flattening. There is
no focal infiltrate. There is no pleural effusion or pneumothorax.
The heart and pulmonary vascularity are normal. The mediastinum is
normal in width. The bony thorax is unremarkable.
IMPRESSION: COPD. There is no pneumonia, CHF, nor other acute cardiopulmonary
abnormality.

## 2016-10-07 IMAGING — DX DG CHEST 2V
2 series · 2 of 2 positions shown · non-contrast
Comparison: Chest radiograph 12/30/2014

CLINICAL DATA: Patient with shortness of breath for multiple
months.

EXAM:
CHEST  2 VIEW

[w chest lat]
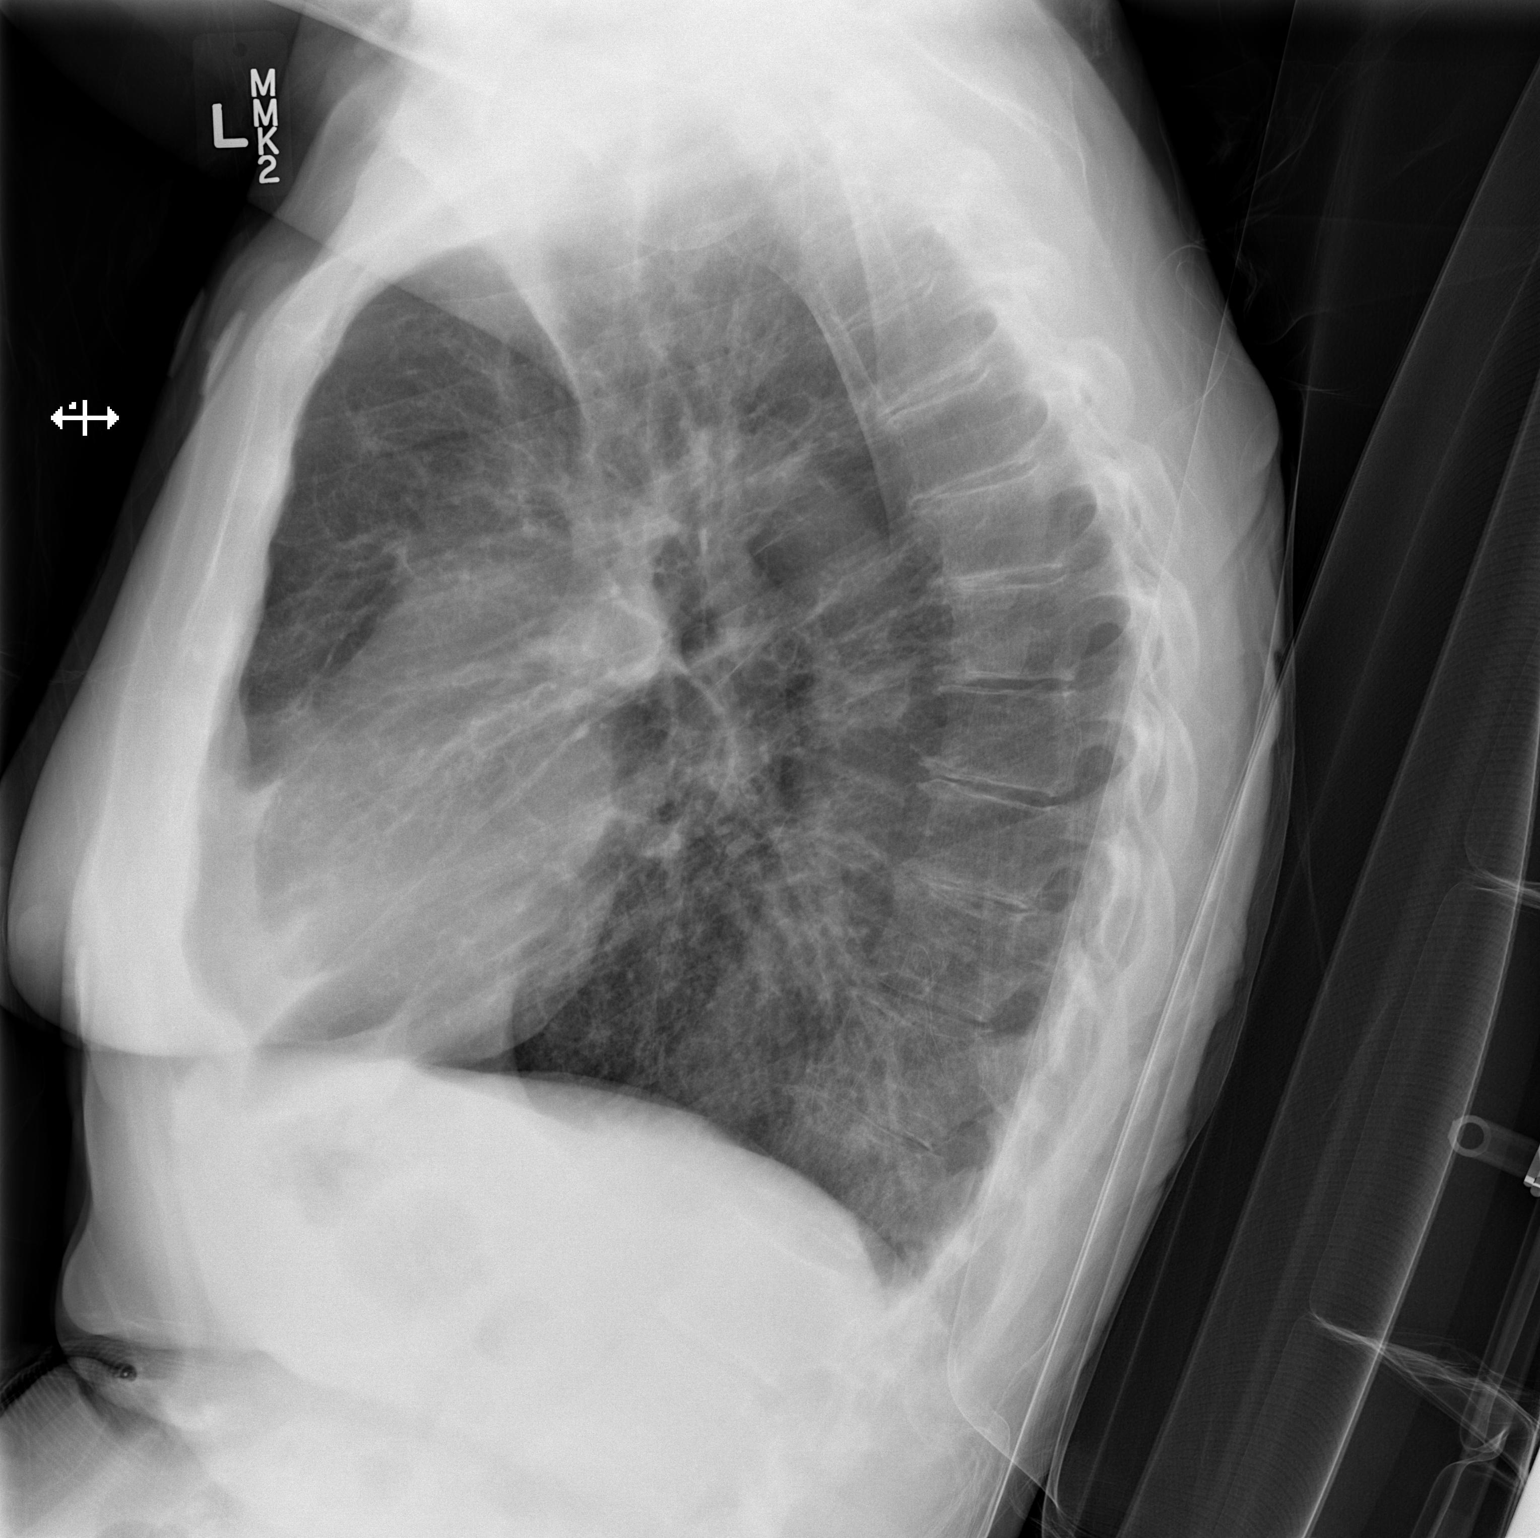

[x chest ap]
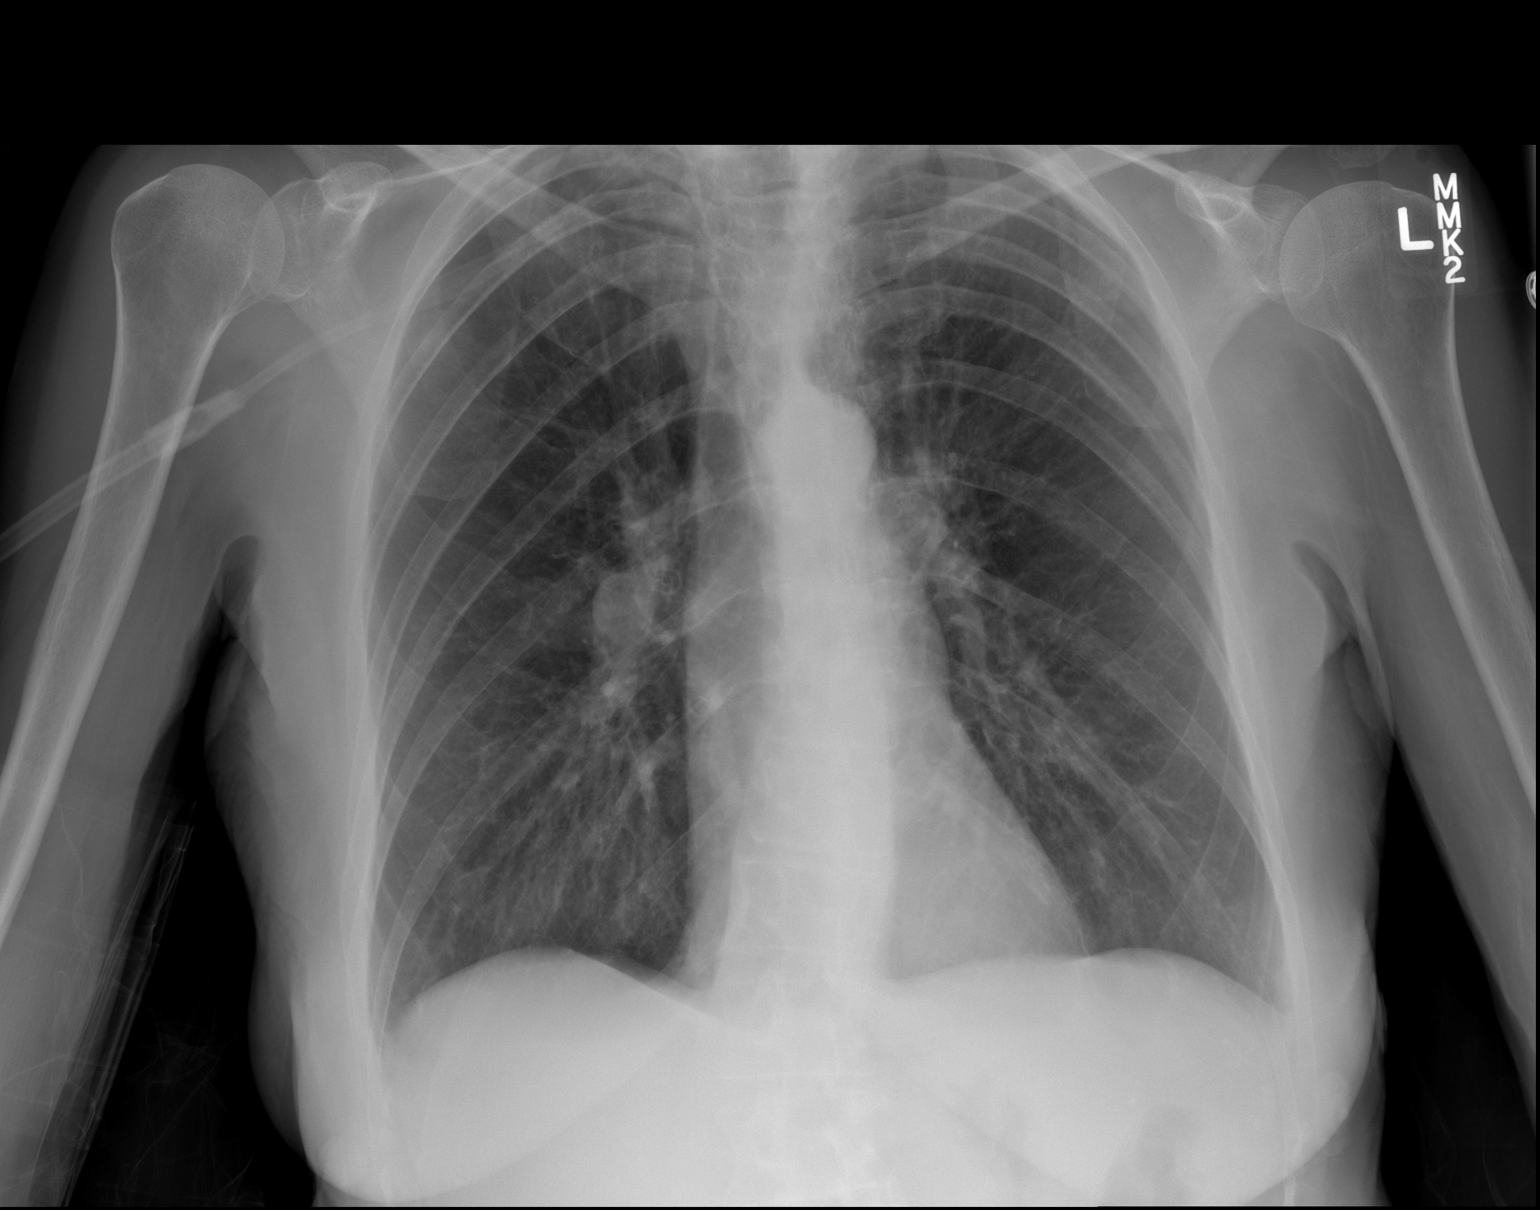

[2 of 2 positions shown; findings below may reference images not displayed]

FINDINGS: Stable cardiac and mediastinal contours. No consolidative pulmonary
opacities. No pleural effusion or pneumothorax. Lungs are
hyperexpanded. Thoracic spine degenerative changes.
IMPRESSION: Pulmonary hyperinflation.  No acute cardiopulmonary process.
